# Patient Record
Sex: Female | Born: 1950 | Race: White | Hispanic: No | Marital: Married | Smoking: Former smoker
Health system: Southern US, Community
[De-identification: ages and names within clinical notes are randomized; demographics above are authoritative.]

## PROBLEM LIST (undated history)

## (undated) DIAGNOSIS — R112 Nausea with vomiting, unspecified: Secondary | ICD-10-CM

## (undated) DIAGNOSIS — I2510 Atherosclerotic heart disease of native coronary artery without angina pectoris: Secondary | ICD-10-CM

## (undated) DIAGNOSIS — M545 Low back pain, unspecified: Secondary | ICD-10-CM

## (undated) DIAGNOSIS — G4730 Sleep apnea, unspecified: Secondary | ICD-10-CM

## (undated) DIAGNOSIS — R7303 Prediabetes: Secondary | ICD-10-CM

## (undated) DIAGNOSIS — K5790 Diverticulosis of intestine, part unspecified, without perforation or abscess without bleeding: Secondary | ICD-10-CM

## (undated) DIAGNOSIS — E785 Hyperlipidemia, unspecified: Secondary | ICD-10-CM

## (undated) DIAGNOSIS — Q6102 Congenital multiple renal cysts: Secondary | ICD-10-CM

## (undated) DIAGNOSIS — J189 Pneumonia, unspecified organism: Secondary | ICD-10-CM

## (undated) DIAGNOSIS — I480 Paroxysmal atrial fibrillation: Secondary | ICD-10-CM

## (undated) DIAGNOSIS — M5134 Other intervertebral disc degeneration, thoracic region: Secondary | ICD-10-CM

## (undated) DIAGNOSIS — E119 Type 2 diabetes mellitus without complications: Secondary | ICD-10-CM

## (undated) DIAGNOSIS — I10 Essential (primary) hypertension: Secondary | ICD-10-CM

## (undated) DIAGNOSIS — Z7901 Long term (current) use of anticoagulants: Secondary | ICD-10-CM

## (undated) DIAGNOSIS — R51 Headache: Secondary | ICD-10-CM

## (undated) DIAGNOSIS — M1990 Unspecified osteoarthritis, unspecified site: Secondary | ICD-10-CM

## (undated) DIAGNOSIS — N9089 Other specified noninflammatory disorders of vulva and perineum: Secondary | ICD-10-CM

## (undated) DIAGNOSIS — I6523 Occlusion and stenosis of bilateral carotid arteries: Secondary | ICD-10-CM

## (undated) DIAGNOSIS — N183 Chronic kidney disease, stage 3 unspecified: Secondary | ICD-10-CM

## (undated) DIAGNOSIS — Z8719 Personal history of other diseases of the digestive system: Secondary | ICD-10-CM

## (undated) DIAGNOSIS — R42 Dizziness and giddiness: Secondary | ICD-10-CM

## (undated) DIAGNOSIS — R0600 Dyspnea, unspecified: Secondary | ICD-10-CM

## (undated) DIAGNOSIS — D509 Iron deficiency anemia, unspecified: Secondary | ICD-10-CM

## (undated) DIAGNOSIS — I350 Nonrheumatic aortic (valve) stenosis: Secondary | ICD-10-CM

## (undated) DIAGNOSIS — K219 Gastro-esophageal reflux disease without esophagitis: Secondary | ICD-10-CM

## (undated) DIAGNOSIS — F329 Major depressive disorder, single episode, unspecified: Secondary | ICD-10-CM

## (undated) DIAGNOSIS — R0609 Other forms of dyspnea: Secondary | ICD-10-CM

## (undated) DIAGNOSIS — G8929 Other chronic pain: Secondary | ICD-10-CM

## (undated) DIAGNOSIS — I4892 Unspecified atrial flutter: Secondary | ICD-10-CM

## (undated) DIAGNOSIS — G43909 Migraine, unspecified, not intractable, without status migrainosus: Secondary | ICD-10-CM

## (undated) DIAGNOSIS — J45998 Other asthma: Secondary | ICD-10-CM

## (undated) DIAGNOSIS — I209 Angina pectoris, unspecified: Secondary | ICD-10-CM

## (undated) DIAGNOSIS — I700 Atherosclerosis of aorta: Secondary | ICD-10-CM

## (undated) DIAGNOSIS — N2581 Secondary hyperparathyroidism of renal origin: Secondary | ICD-10-CM

## (undated) DIAGNOSIS — I4891 Unspecified atrial fibrillation: Secondary | ICD-10-CM

## (undated) DIAGNOSIS — Z9889 Other specified postprocedural states: Secondary | ICD-10-CM

## (undated) HISTORY — DX: Unspecified atrial flutter: I48.92

## (undated) HISTORY — PX: HERNIA REPAIR: SHX51

## (undated) HISTORY — DX: Occlusion and stenosis of bilateral carotid arteries: I65.23

## (undated) HISTORY — PX: APPENDECTOMY: SHX54

## (undated) HISTORY — PX: BREAST BIOPSY: SHX20

## (undated) HISTORY — DX: Prediabetes: R73.03

## (undated) HISTORY — PX: EXPLORATORY LAPAROTOMY: SUR591

## (undated) HISTORY — DX: Paroxysmal atrial fibrillation: I48.0

---

## 1979-08-23 DIAGNOSIS — Z8711 Personal history of peptic ulcer disease: Secondary | ICD-10-CM

## 1979-08-23 HISTORY — DX: Personal history of peptic ulcer disease: Z87.11

## 1979-12-23 HISTORY — PX: VAGINAL HYSTERECTOMY: SUR661

## 1979-12-23 HISTORY — PX: LUMBAR DISC SURGERY: SHX700

## 2004-03-18 ENCOUNTER — Encounter: Admission: RE | Admit: 2004-03-18 | Discharge: 2004-03-18 | Payer: Self-pay | Admitting: Internal Medicine

## 2004-05-03 ENCOUNTER — Encounter: Admission: RE | Admit: 2004-05-03 | Discharge: 2004-05-03 | Payer: Self-pay | Admitting: Gastroenterology

## 2004-07-01 ENCOUNTER — Ambulatory Visit (HOSPITAL_COMMUNITY): Admission: RE | Admit: 2004-07-01 | Discharge: 2004-07-01 | Payer: Self-pay | Admitting: Gastroenterology

## 2004-12-22 HISTORY — PX: CHOLECYSTECTOMY: SHX55

## 2005-12-15 ENCOUNTER — Emergency Department (HOSPITAL_COMMUNITY): Admission: EM | Admit: 2005-12-15 | Discharge: 2005-12-15 | Payer: Self-pay | Admitting: Emergency Medicine

## 2005-12-18 ENCOUNTER — Ambulatory Visit (HOSPITAL_COMMUNITY): Admission: RE | Admit: 2005-12-18 | Discharge: 2005-12-18 | Payer: Self-pay | Admitting: Preventative Medicine

## 2005-12-18 ENCOUNTER — Inpatient Hospital Stay (HOSPITAL_COMMUNITY): Admission: EM | Admit: 2005-12-18 | Discharge: 2005-12-23 | Payer: Self-pay | Admitting: Emergency Medicine

## 2005-12-19 ENCOUNTER — Encounter (INDEPENDENT_AMBULATORY_CARE_PROVIDER_SITE_OTHER): Payer: Self-pay | Admitting: General Surgery

## 2009-08-13 ENCOUNTER — Emergency Department (HOSPITAL_COMMUNITY): Admission: EM | Admit: 2009-08-13 | Discharge: 2009-08-13 | Payer: Self-pay | Admitting: Emergency Medicine

## 2011-03-29 LAB — BASIC METABOLIC PANEL
BUN: 17 mg/dL (ref 6–23)
CO2: 31 mEq/L (ref 19–32)
Calcium: 9.5 mg/dL (ref 8.4–10.5)
Chloride: 104 mEq/L (ref 96–112)
Creatinine, Ser: 0.76 mg/dL (ref 0.4–1.2)
GFR calc Af Amer: >60 mL/min (ref >60)
GFR calc non Af Amer: >60 mL/min (ref >60)
Glucose, Bld: 98 mg/dL (ref 70–99)
Potassium: 3.9 mEq/L (ref 3.5–5.1)
Sodium: 138 mEq/L (ref 135–145)

## 2011-03-29 LAB — DIFFERENTIAL
Basophils Absolute: 0 10*3/uL (ref 0.0–0.1)
Basophils Relative: 0 % (ref 0–1)
Eosinophils Absolute: 0.2 10*3/uL (ref 0.0–0.7)
Eosinophils Relative: 2 % (ref 0–5)
Lymphocytes Relative: 26 % (ref 12–46)
Lymphs Abs: 2.7 10*3/uL (ref 0.7–4.0)
Monocytes Absolute: 0.5 10*3/uL (ref 0.1–1.0)
Monocytes Relative: 5 % (ref 3–12)
Neutro Abs: 7 10*3/uL (ref 1.7–7.7)
Neutrophils Relative %: 67 % (ref 43–77)

## 2011-03-29 LAB — CBC
HCT: 38.3 % (ref 36.0–46.0)
Hemoglobin: 13.1 g/dL (ref 12.0–15.0)
MCHC: 34.1 g/dL (ref 30.0–36.0)
MCV: 90.7 fL (ref 78.0–100.0)
Platelets: 274 10*3/uL (ref 150–400)
RBC: 4.23 MIL/uL (ref 3.87–5.11)
RDW: 13.4 % (ref 11.5–15.5)
WBC: 10.5 10*3/uL (ref 4.0–10.5)

## 2011-05-09 NOTE — Op Note (Signed)
NAME:  Julie Rush, Julie Rush NO.:  [id number]   MEDICAL RECORD NO.:  [id number]                   PATIENT TYPE:  AMB   LOCATION:  ENDO                                 FACILITY:  [facility]   PHYSICIAN:  Anselmo Rod, M.D.               DATE OF BIRTH:  03-20-1951   DATE OF PROCEDURE:  07/01/2004  DATE OF DISCHARGE:                                 OPERATIVE REPORT   PROCEDURE PERFORMED:  Screening colonoscopy.   ENDOSCOPIST:  Charna Elizabeth, M.D.   INSTRUMENT USED:  Olympus video colonoscope.   INDICATIONS FOR PROCEDURE:  The patient is a 60 year old white female  undergoing screening colonoscopy.  Rule out colonic polyps, masses, etc.   PREPROCEDURE PREPARATION:  Informed consent was procured from the patient.  The patient was fasted for eight hours prior to the procedure and prepped  with a bottle of magnesium citrate and a gallon of GoLYTELY the night prior  to the procedure.   PREPROCEDURE PHYSICAL:  The patient had stable vital signs.  Neck supple.  Chest clear to auscultation.  S1 and S2 regular.  Abdomen soft with normal  bowel sounds.   DESCRIPTION OF PROCEDURE:  The patient was placed in left lateral decubitus  position and sedated with 100 mg of Demerol and 10 mg of Versed in slow  incremental doses.  Once the patient was adequately sedated and maintained  on low flow oxygen and continuous cardiac monitoring, the Olympus video  colonoscope was advanced from the rectum to the cecum.  The appendicular  orifice and ileocecal valve were clearly visualized and photographed.  There  was evidence of diffuse melanosis coli throughout the colon.  The patient's  position had to be changed from the left lateral to the supine and then the  right lateral position to reach the cecal base because of the patient's body  habitus and a history of multiple abdominal surgeries.  Retroflexion in the  rectum revealed no abnormalities.  The patient tolerated the procedure  well  without complication.   IMPRESSION:  Diffuse melanosis coli throughout the colon.  No masses,  polyps, or diverticula were seen.   RECOMMENDATIONS:  1. Continue high fiber diet with liberal fluid intake.  2. Avoid laxatives and use stool softeners instead.  3. Repeat colonoscopy is recommended in the next 10 years unless the patient     develops any abnormal symptoms in the interim.  4. Outpatient followup as need arises in the future.                                               Anselmo Rod, M.D.    JNM/MEDQ  D:  07/01/2004  T:  07/01/2004  Job:  [id number]   cc:   Merlene Laughter. Renae Gloss, M.D.  [phone]  [address]  [address]  [place]  [place] [address]  Fax: [phone]

## 2011-05-09 NOTE — Op Note (Signed)
NAME:  DIASHA, CASTLEMAN NO.:  [id number]   MEDICAL RECORD NO.:  [id number]          PATIENT TYPE:  INP   LOCATION:  A303                          FACILITY:  [facility]   PHYSICIAN:  Dirk Dress. Katrinka Blazing, M.D.   DATE OF BIRTH:  [birth year]   DATE OF PROCEDURE:  DATE OF DISCHARGE:  12/23/2005                                 OPERATIVE REPORT   PREOPERATIVE DIAGNOSIS:  Acute cholecystitis, cholelithiasis.   POSTOPERATIVE DIAGNOSIS:  Acute gangrenous cholecystitis with empyema of the  gallbladder and cholelithiasis.   PROCEDURE:  Open cholecystectomy.   SURGEON:  Dirk Dress. Katrinka Blazing, M.D.   DESCRIPTION OF PROCEDURE:  Under general anesthesia the patient's abdomen  was prepped and draped in a sterile field.  A right subcostal incision was  made.  The excision extended down to the fascia.  The rectus fascia was  opened and the muscle was transversely divided. The peritoneal was entered,  and an acute tense, darkened gallbladder was encountered.  The abdomen was  packed off.  The gallbladder was drained of 80 mL of pus.  This was thick,  white, purulent creamy, exudate.  There was no substance that could be  identified in the bile.  Once this was done, the gallbladder wall was scored  and the gallbladder was separated from the infrahepatic space with some  difficulty because of the extreme friability of the wall of the gallbladder.  The gallbladder could not be dissected without tearing, so once the incision  was made an open hand was placed in the peritoneal cavity and using finger  dissection, the gallbladder was totally separated from the intrahepatic bed.  Dissection was continued down to the cystic duct.  The cystic duct was  identified at its junction with the common bile duct.  At this point it was  tied with 3 sutures of 2-0 silk.  It was then divided.  There were two  vessels that were identified.  They were clipped with large clips and  divided.  The gallbladder was then  passed off the table.  Irrigation was  carried out.   Sponge, needle, and instrument and blade counts were verified as correct x2.  Two JP drains were placed in the right subhepatic space.  These were brought  out through separate stab incisions.  More irrigation was carried out.  The  abdomen was then closed after verifying that the sponge, needle, instrument  and blade counts were correct too.  The fascia was closed in two layers  using #0 Prolene.  The subcutaneous tissue was irrigated and closed with 2-0  Monocryl. The skin was closed with staples.  Drains were secured with 3-0  nylon.  The patient tolerated the procedure well.  Dressings were placed.  She was awakened without difficulty, she was transferred to a bed, and taken  to the postanesthetic care unit for further monitoring.      Dirk Dress. Katrinka Blazing, M.D.  Electronically Signed     LCS/MEDQ  D:  12/19/2005  T:  12/19/2005  Job:  [id number]   cc:   Merlene Laughter. Renae Gloss, M.D.  Fax:  [phone] 

## 2011-05-09 NOTE — Discharge Summary (Signed)
NAME:  Julie Rush, Julie Rush NO.:  [id number]   MEDICAL RECORD NO.:  [id number]          PATIENT TYPE:  INP   LOCATION:  A303                          FACILITY:  [facility]   PHYSICIAN:  Dirk Dress. Katrinka Blazing, M.D.   DATE OF BIRTH:  25-Feb-1951   DATE OF ADMISSION:  12/18/2005  DATE OF DISCHARGE:  01/02/2007LH                                 DISCHARGE SUMMARY   DISCHARGE DIAGNOSES:  1.  Acute gangrenous cholecystitis with empyema of the gallbladder.  2.  Cholelithiasis.  3.  Peptic ulcer disease.  4.  Gastroesophageal reflux disease.  5.  History of urinary stress incontinence.   PROCEDURE:  Open cholecystectomy on January 2.   DISPOSITION:  The patient discharged home in stable, satisfactory condition.   DISCHARGE MEDICATIONS:  1.  Cipro 500 mg b.i.d.  2.  Demerol 50 mg four times a day p.r.n.   SPECIAL INSTRUCTIONS:  The patient is advised to empty her JP drain once  daily.  She is to be seen in the office in 2 weeks.   HOSPITAL COURSE:  A 60 year old female with history of recurrent abdominal  pain, nausea and vomiting.  She had pain on December 24.  She was seen in  the emergency room and had EKG, cardiac enzymes and CT done which was normal  and she was discharged home.  She had progressive symptoms with recurrent  episodes of nausea and vomiting.  She states that she was symptomatic every  day from that point.  She was only able to keep water down.  Pain became  much more severe and she started having chills.  She returned to the local  [facility] and was scheduled to have an ultrasound.  Ultrasound  revealed multiple gallstones with a evidence of acute cholecystitis.  The  patient was admitted for treatment.  Abdominal exam revealed a diffusely  tender abdomen much more prominent in the upper abdomen.  She was not  jaundiced.  She was admitted and treated with IV antibiotics, IV fluids and  antiemetics.  She was scheduled to have an open cholecystectomy which  was  done on the day after  admission.  She did very well in the postoperative period.  She was  continued on IV antibiotics.  She had some constipation which was treated  with GoLYTELY and the bowels finally moved.  She was subsequently discharged  home on January 2, in satisfactory condition.      Dirk Dress. Katrinka Blazing, M.D.  Electronically Signed     LCS/MEDQ  D:  02/10/2006  T:  02/10/2006  Job:  [id number]

## 2011-05-09 NOTE — H&P (Signed)
NAME:  Julie Rush, Julie Rush NO.:  [id number]   MEDICAL RECORD NO.:  [id number]          PATIENT TYPE:  INP   LOCATION:  A303                          FACILITY:  [facility]   PHYSICIAN:  Dirk Dress. Katrinka Blazing, M.D.   DATE OF BIRTH:  04-12-1951   DATE OF ADMISSION:  12/18/2005  DATE OF DISCHARGE:  LH                                HISTORY & PHYSICAL   HISTORY OF PRESENT ILLNESS:  This is a 60 year old female with history of  recurrent abdominal pain, nausea and vomiting. The patient states that she  had abdominal pain on December 24. It was more of a sharp pain in her  epigastrium radiating into her chest and into her back. She had nausea with  vomiting. She was seen in the emergency room at this hospital. She had an  EKG, cardiac enzymes, and CT scan done and was discharged home. She had  progressive symptoms with recurrent episodes of nausea and vomiting. She has  been symptomatic every day since then. She has only been able to keep down  water. Yesterday she started having diarrhea. The pain became much worse and  she started having chills. The patient returned to the local urgent care  center and was scheduled to have an ultrasound. Ultrasound of the abdomen  revealed multiple gallstones with evidence of acute cholecystitis. The  patient is admitted for treatment.   PAST MEDICAL HISTORY:  1.  She has peptic ulcer disease.  2.  Gastroesophageal reflux disease.  3.  Urinary stress incontinence.   MEDICATIONS:  Prilosec over-the-counter once a day.   SURGICAL HISTORY:  Right breast biopsy, ventral hernia repair, appendectomy,  lumbar disk surgery, exploratory laparotomy for bowel obstruction.   ALLERGIES:  CODEINE AND SULFA.   SOCIAL HISTORY:  She is married with two children. She is employed as a  Architect. She does not use tobacco, drugs or alcohol.   PHYSICAL EXAMINATION:  GENERAL:  The patient appears to be in moderate  distress.  VITAL SIGNS:  Blood pressure is  108/78, pulse 115 respirations 20,  temperature 98.5.  HEENT:  Unremarkable.  NECK:  Supple, no JVD, bruit, adenopathy or thyromegaly.  CHEST:  Clear to auscultation, no rales, rubs, rhonchi or wheezes.  HEART:  Regular rate and rhythm without murmur, rub, or gallop.  ABDOMEN:  Obese, diffusely tender in the upper abdomen. She has a well-  healed upper midline scar, upper transverse scar and a hypogastric  transverse scar. She has normal active bowel sounds.  EXTREMITIES:  Unremarkable except for diffuse tenderness of the skin. There  is no significant edema.  NEUROLOGIC EXAM:  No focal motor, sensory or cerebellar deficit.   IMPRESSION:  1.  Acute cholecystitis with cholelithiasis.  2.  Gastroesophageal reflux disease.  3.  History of peptic ulcer disease.  4.  Stress incontinence.   PLAN:  The patient is admitted. She will be treated with antibiotic, IV  fluids, antiemetics. She will be scheduled for open cholecystectomy  tomorrow. The procedure will be done open because of her history of  intestinal obstruction with need for adhesiolysis in  the upper abdomen and  the mid area from the umbilicus up to the xiphoid area.      Dirk Dress. Katrinka Blazing, M.D.  Electronically Signed     LCS/MEDQ  D:  12/18/2005  T:  12/18/2005  Job:  [id number]

## 2011-09-19 ENCOUNTER — Encounter: Payer: Self-pay | Admitting: *Deleted

## 2011-09-19 NOTE — Progress Notes (Signed)
ERROR

## 2011-10-13 ENCOUNTER — Encounter: Payer: Self-pay | Admitting: *Deleted

## 2011-12-23 DIAGNOSIS — E119 Type 2 diabetes mellitus without complications: Secondary | ICD-10-CM

## 2011-12-23 DIAGNOSIS — G4730 Sleep apnea, unspecified: Secondary | ICD-10-CM

## 2011-12-23 HISTORY — DX: Sleep apnea, unspecified: G47.30

## 2011-12-23 HISTORY — DX: Type 2 diabetes mellitus without complications: E11.9

## 2012-03-22 LAB — HM PAP SMEAR: HM Pap smear: NORMAL

## 2012-04-05 LAB — HM COLONOSCOPY

## 2012-04-16 ENCOUNTER — Other Ambulatory Visit: Payer: Self-pay | Admitting: Family Medicine

## 2012-04-16 DIAGNOSIS — Z1231 Encounter for screening mammogram for malignant neoplasm of breast: Secondary | ICD-10-CM

## 2012-04-26 ENCOUNTER — Ambulatory Visit
Admission: RE | Admit: 2012-04-26 | Discharge: 2012-04-26 | Disposition: A | Discharge status: Home / Self Care | Payer: PRIVATE HEALTH INSURANCE | Source: Ambulatory Visit | Attending: Family Medicine | Admitting: Family Medicine

## 2012-04-26 DIAGNOSIS — Z1231 Encounter for screening mammogram for malignant neoplasm of breast: Secondary | ICD-10-CM

## 2012-06-08 ENCOUNTER — Encounter (HOSPITAL_COMMUNITY): Payer: Self-pay | Admitting: Cardiology

## 2012-06-08 ENCOUNTER — Observation Stay (HOSPITAL_COMMUNITY)
Admission: EM | Admit: 2012-06-08 | Discharge: 2012-06-09 | Disposition: A | Discharge status: Home / Self Care | Payer: PRIVATE HEALTH INSURANCE | Source: Ambulatory Visit | Attending: Cardiology | Admitting: Cardiology

## 2012-06-08 ENCOUNTER — Emergency Department (HOSPITAL_COMMUNITY): Payer: PRIVATE HEALTH INSURANCE

## 2012-06-08 DIAGNOSIS — R0609 Other forms of dyspnea: Secondary | ICD-10-CM | POA: Diagnosis present

## 2012-06-08 DIAGNOSIS — M353 Polymyalgia rheumatica: Secondary | ICD-10-CM | POA: Insufficient documentation

## 2012-06-08 DIAGNOSIS — K219 Gastro-esophageal reflux disease without esophagitis: Secondary | ICD-10-CM | POA: Insufficient documentation

## 2012-06-08 DIAGNOSIS — R0789 Other chest pain: Principal | ICD-10-CM | POA: Insufficient documentation

## 2012-06-08 DIAGNOSIS — F32A Depression, unspecified: Secondary | ICD-10-CM

## 2012-06-08 DIAGNOSIS — I10 Essential (primary) hypertension: Secondary | ICD-10-CM | POA: Insufficient documentation

## 2012-06-08 DIAGNOSIS — F3289 Other specified depressive episodes: Secondary | ICD-10-CM | POA: Insufficient documentation

## 2012-06-08 DIAGNOSIS — R079 Chest pain, unspecified: Secondary | ICD-10-CM

## 2012-06-08 DIAGNOSIS — E785 Hyperlipidemia, unspecified: Secondary | ICD-10-CM | POA: Diagnosis present

## 2012-06-08 DIAGNOSIS — E1121 Type 2 diabetes mellitus with diabetic nephropathy: Secondary | ICD-10-CM | POA: Diagnosis present

## 2012-06-08 DIAGNOSIS — R0602 Shortness of breath: Secondary | ICD-10-CM | POA: Insufficient documentation

## 2012-06-08 DIAGNOSIS — F329 Major depressive disorder, single episode, unspecified: Secondary | ICD-10-CM | POA: Insufficient documentation

## 2012-06-08 DIAGNOSIS — R110 Nausea: Secondary | ICD-10-CM | POA: Insufficient documentation

## 2012-06-08 DIAGNOSIS — E119 Type 2 diabetes mellitus without complications: Secondary | ICD-10-CM | POA: Insufficient documentation

## 2012-06-08 HISTORY — DX: Gastro-esophageal reflux disease without esophagitis: K21.9

## 2012-06-08 HISTORY — DX: Other forms of dyspnea: R06.09

## 2012-06-08 HISTORY — DX: Angina pectoris, unspecified: I20.9

## 2012-06-08 HISTORY — DX: Major depressive disorder, single episode, unspecified: F32.9

## 2012-06-08 HISTORY — DX: Low back pain: M54.5

## 2012-06-08 HISTORY — DX: Sleep apnea, unspecified: G47.30

## 2012-06-08 HISTORY — DX: Other chronic pain: G89.29

## 2012-06-08 HISTORY — DX: Type 2 diabetes mellitus without complications: E11.9

## 2012-06-08 HISTORY — DX: Other specified postprocedural states: Z98.890

## 2012-06-08 HISTORY — DX: Migraine, unspecified, not intractable, without status migrainosus: G43.909

## 2012-06-08 HISTORY — DX: Essential (primary) hypertension: I10

## 2012-06-08 HISTORY — DX: Nausea with vomiting, unspecified: R11.2

## 2012-06-08 HISTORY — DX: Unspecified osteoarthritis, unspecified site: M19.90

## 2012-06-08 HISTORY — DX: Personal history of other diseases of the digestive system: Z87.19

## 2012-06-08 HISTORY — DX: Dyspnea, unspecified: R06.00

## 2012-06-08 HISTORY — DX: Depression, unspecified: F32.A

## 2012-06-08 HISTORY — DX: Pneumonia, unspecified organism: J18.9

## 2012-06-08 HISTORY — DX: Low back pain, unspecified: M54.50

## 2012-06-08 HISTORY — DX: Headache: R51

## 2012-06-08 LAB — POCT I-STAT, CHEM 8
BUN: 13 mg/dL (ref 6–23)
Calcium, Ion: 1.23 mmol/L (ref 1.12–1.32)
Chloride: 103 mEq/L (ref 96–112)
Creatinine, Ser: 0.9 mg/dL (ref 0.50–1.10)
Glucose, Bld: 133 mg/dL — ABNORMAL HIGH (ref 70–99)
HCT: 40 % (ref 36.0–46.0)
Hemoglobin: 13.6 g/dL (ref 12.0–15.0)
Potassium: 4.7 mEq/L (ref 3.5–5.1)
Sodium: 140 mEq/L (ref 135–145)
TCO2: 25 mmol/L (ref 0–100)

## 2012-06-08 LAB — CBC
HCT: 38.4 % (ref 36.0–46.0)
HCT: 38 % (ref 36.0–46.0)
Hemoglobin: 12.6 g/dL (ref 12.0–15.0)
Hemoglobin: 12.5 g/dL (ref 12.0–15.0)
MCH: 29.9 pg (ref 26.0–34.0)
MCH: 30 pg (ref 26.0–34.0)
MCHC: 32.8 g/dL (ref 30.0–36.0)
MCHC: 32.9 g/dL (ref 30.0–36.0)
MCV: 91.2 fL (ref 78.0–100.0)
MCV: 91.1 fL (ref 78.0–100.0)
Platelets: 256 10*3/uL (ref 150–400)
Platelets: 270 10*3/uL (ref 150–400)
RBC: 4.21 MIL/uL (ref 3.87–5.11)
RBC: 4.17 MIL/uL (ref 3.87–5.11)
RDW: 13.2 % (ref 11.5–15.5)
RDW: 13.2 % (ref 11.5–15.5)
WBC: 10.5 10*3/uL (ref 4.0–10.5)
WBC: 10.5 10*3/uL (ref 4.0–10.5)

## 2012-06-08 LAB — CARDIAC PANEL(CRET KIN+CKTOT+MB+TROPI)
CK, MB: 1.5 ng/mL (ref 0.3–4.0)
Relative Index: INVALID (ref 0.0–2.5)
Total CK: 59 U/L (ref 7–177)
Troponin I: <0.3 ng/mL (ref <0.30)

## 2012-06-08 LAB — CREATININE, SERUM
Creatinine, Ser: 0.82 mg/dL (ref 0.50–1.10)
GFR calc Af Amer: 87 mL/min — ABNORMAL LOW (ref >90)
GFR calc non Af Amer: 75 mL/min — ABNORMAL LOW (ref >90)

## 2012-06-08 LAB — GLUCOSE, CAPILLARY: Glucose-Capillary: 145 mg/dL — ABNORMAL HIGH (ref 70–99)

## 2012-06-08 LAB — POCT I-STAT TROPONIN I: Troponin i, poc: 0 ng/mL (ref 0.00–0.08)

## 2012-06-08 LAB — DIFFERENTIAL
Basophils Absolute: 0 10*3/uL (ref 0.0–0.1)
Basophils Relative: 0 % (ref 0–1)
Eosinophils Absolute: 0.2 10*3/uL (ref 0.0–0.7)
Eosinophils Relative: 2 % (ref 0–5)
Lymphocytes Relative: 24 % (ref 12–46)
Lymphs Abs: 2.5 10*3/uL (ref 0.7–4.0)
Monocytes Absolute: 0.6 10*3/uL (ref 0.1–1.0)
Monocytes Relative: 6 % (ref 3–12)
Neutro Abs: 7.1 10*3/uL (ref 1.7–7.7)
Neutrophils Relative %: 68 % (ref 43–77)

## 2012-06-08 MED ORDER — OMEPRAZOLE MAGNESIUM 20 MG PO TBEC: 20.0000 mg | DELAYED_RELEASE_TABLET | Freq: Two times a day (BID) | ORAL | Status: DC

## 2012-06-08 MED ORDER — ALPRAZOLAM 0.25 MG PO TABS: 0.2500 mg | ORAL_TABLET | Freq: Two times a day (BID) | ORAL | Status: DC | PRN

## 2012-06-08 MED ORDER — ONDANSETRON HCL 4 MG/2ML IJ SOLN: 4.0000 mg | Freq: Four times a day (QID) | INTRAMUSCULAR | Status: DC | PRN

## 2012-06-08 MED ORDER — PANTOPRAZOLE SODIUM 40 MG PO TBEC: 40.0000 mg | DELAYED_RELEASE_TABLET | Freq: Every day | ORAL | Status: DC

## 2012-06-08 MED ORDER — SODIUM CHLORIDE 0.9 % IJ SOLN: 3.0000 mL | INTRAMUSCULAR | Status: DC | PRN

## 2012-06-08 MED ORDER — SODIUM CHLORIDE 0.9 % IV SOLN: 250.0000 mL | INTRAVENOUS | Status: DC | PRN

## 2012-06-08 MED ORDER — ENOXAPARIN SODIUM 40 MG/0.4ML ~~LOC~~ SOLN
40.0000 mg | SUBCUTANEOUS | Status: DC
  Administered 2012-06-09: 40 mg via SUBCUTANEOUS
  Filled 2012-06-08 (×2): qty 0.4

## 2012-06-08 MED ORDER — ASPIRIN 300 MG RE SUPP
300.0000 mg | RECTAL | Status: DC
  Filled 2012-06-08: qty 1

## 2012-06-08 MED ORDER — SODIUM CHLORIDE 0.9 % IJ SOLN
3.0000 mL | Freq: Two times a day (BID) | INTRAMUSCULAR | Status: DC
  Administered 2012-06-09 (×2): 3 mL via INTRAVENOUS

## 2012-06-08 MED ORDER — ACETAMINOPHEN 500 MG PO TABS
1000.0000 mg | ORAL_TABLET | Freq: Four times a day (QID) | ORAL | Status: DC | PRN
  Administered 2012-06-09: 975 mg via ORAL
  Filled 2012-06-08: qty 2

## 2012-06-08 MED ORDER — ASPIRIN EC 81 MG PO TBEC
81.0000 mg | DELAYED_RELEASE_TABLET | Freq: Every day | ORAL | Status: DC
  Administered 2012-06-09: 81 mg via ORAL
  Filled 2012-06-08: qty 1

## 2012-06-08 MED ORDER — ASPIRIN 81 MG PO CHEW: 324.0000 mg | CHEWABLE_TABLET | ORAL | Status: DC

## 2012-06-08 MED ORDER — NITROGLYCERIN 0.4 MG SL SUBL: 0.4000 mg | SUBLINGUAL_TABLET | SUBLINGUAL | Status: DC | PRN

## 2012-06-08 MED ORDER — METOPROLOL SUCCINATE ER 50 MG PO TB24
50.0000 mg | ORAL_TABLET | Freq: Every morning | ORAL | Status: DC
  Administered 2012-06-09: 50 mg via ORAL
  Filled 2012-06-08: qty 1

## 2012-06-08 NOTE — H&P (Addendum)
Julie Rush is an 61 y.o. female.    Chief Complaint: Chest pain. HPI: Chest pain started today and was seen by Dr. Kellie Shropshire.Chest pain that started with nausea yesterday she states that yesterday she woke up with chest  Pain. Since then she has been noticing severe chest pain.  This is associated with diaphoresis. Patient also states that she's been pretty much worn out over the last several weeks. She also complains of pain in her abdomen, headaches, leg ache.  On further questioning she denies any fever, hemoptysis, PND, orthopnea, palpitation.  She was previously evaluated for similar chest pain in the past.  She denies any bloody stools, dark stools.  No fever.  No bladder disturbances. It is described as cramp. It is located in the front of the chest region.   She was sent over to the emergency department from Dr. Currie Paris office for further evaluation.  This morning patient is very comfortable although she she states that she is very tired and worn out and has no energy.  She feels like she wants to sleep all day.  She states that she is hurting all over the body and the chest.    Patient c/o shortness of breath. Shortness of breath gradual and has been ongoing for the last  several years and has not changed.No PND  or orthopnea. Minimal exertion brings on symptoms. Worse with climbing stairs or uphill walking and relieved with rest. This has been ongoing for the past several months to several years and has not changed.  No hemoptysis.  Past Medical History  Diagnosis Date  . Hypertension   . GERD (gastroesophageal reflux disease)   . Diabetes mellitus     History reviewed. No pertinent past surgical history.  History reviewed. No pertinent family history. Social History:  does not have a smoking history on file. She does not have any smokeless tobacco history on file. Her alcohol and drug histories not on file.  Allergies:  Allergies  Allergen Reactions  . Codeine Nausea  And Vomiting  . Sulfa Antibiotics Itching and Rash  . Trimethoprim Hives    Medications Prior to Admission  Medication Sig Dispense Refill  . acetaminophen (TYLENOL) 500 MG tablet Take 1,000 mg by mouth every 6 (six) hours as needed. For pain      . metoprolol succinate (TOPROL-XL) 50 MG 24 hr tablet Take 50 mg by mouth every morning. Take with or immediately following a meal.      . nitroGLYCERIN (NITROSTAT) 0.4 MG SL tablet Place 0.4 mg under the tongue every 5 (five) minutes as needed. For chest pain      . omeprazole (PRILOSEC OTC) 20 MG tablet Take 20 mg by mouth daily.        Results for orders placed during the hospital encounter of 06/08/12 (from the past 48 hour(s))  CBC     Status: Normal   Collection Time   06/08/12  6:51 PM      Component Value Range Comment   WBC 10.5  4.0 - 10.5 K/uL    RBC 4.21  3.87 - 5.11 MIL/uL    Hemoglobin 12.6  12.0 - 15.0 g/dL    HCT 16.1  09.6 - 04.5 %    MCV 91.2  78.0 - 100.0 fL    MCH 29.9  26.0 - 34.0 pg    MCHC 32.8  30.0 - 36.0 g/dL    RDW 40.9  81.1 - 91.4 %    Platelets  256  150 - 400 K/uL   DIFFERENTIAL     Status: Normal   Collection Time   06/08/12  6:51 PM      Component Value Range Comment   Neutrophils Relative 68  43 - 77 %    Neutro Abs 7.1  1.7 - 7.7 K/uL    Lymphocytes Relative 24  12 - 46 %    Lymphs Abs 2.5  0.7 - 4.0 K/uL    Monocytes Relative 6  3 - 12 %    Monocytes Absolute 0.6  0.1 - 1.0 K/uL    Eosinophils Relative 2  0 - 5 %    Eosinophils Absolute 0.2  0.0 - 0.7 K/uL    Basophils Relative 0  0 - 1 %    Basophils Absolute 0.0  0.0 - 0.1 K/uL   POCT I-STAT TROPONIN I     Status: Normal   Collection Time   06/08/12  7:05 PM      Component Value Range Comment   Troponin i, poc 0.00  0.00 - 0.08 ng/mL    Comment 3            POCT I-STAT, CHEM 8     Status: Abnormal   Collection Time   06/08/12  7:06 PM      Component Value Range Comment   Sodium 140  135 - 145 mEq/L    Potassium 4.7  3.5 - 5.1 mEq/L     Chloride 103  96 - 112 mEq/L    BUN 13  6 - 23 mg/dL    Creatinine, Ser 1.61  0.50 - 1.10 mg/dL    Glucose, Bld 096 (*) 70 - 99 mg/dL    Calcium, Ion 0.45  4.09 - 1.32 mmol/L    TCO2 25  0 - 100 mmol/L    Hemoglobin 13.6  12.0 - 15.0 g/dL    HCT 81.1  91.4 - 78.2 %    Dg Chest Portable 1 View  06/08/2012  *RADIOLOGY REPORT*  Clinical Data: Chest pain  PORTABLE CHEST - 1 VIEW  Comparison: 12/15/2005  Findings: Borderline cardiomegaly noted.  No acute infiltrate or pleural effusion.  No pulmonary edema.  Bony thorax is stable .  IMPRESSION: No active disease.  No significant change.  Original Report Authenticated By: Natasha Mead, M.D.   Lipid Panel     Component Value Date/Time   CHOL 173 06/09/2012 0350   TRIG 118 06/09/2012 0350   HDL 42 06/09/2012 0350   CHOLHDL 4.1 06/09/2012 0350   VLDL 24 06/09/2012 0350   LDLCALC 107* 06/09/2012 0350    Cardiac Panel (last 3 results)  Basename 06/09/12 0834 06/09/12 0350 06/08/12 2215  CKTOTAL 126 54 59  CKMB 2.0 1.6 1.5  TROPONINI <0.30 <0.30 <0.30  RELINDX 1.6 RELATIVE INDEX IS INVALID RELATIVE INDEX IS INVALID       ROS: Arthritis-yes, life-style limiting-yes;  Cramping of the legs and feet at night or with activity-yes; Diabetes-no diet controlled;  Hypothyroidism-no;  Previous Stroke-no, Previous GI Bleed-has stomach ulcers ,  Recent weight change-no.     Other systems negative.    Blood pressure 124/73, pulse 74, temperature 98.1 F (36.7 C), temperature source Oral, resp. rate 20, height 5\' 6"  (1.676 m), weight 134.4 kg (296 lb 4.8 oz), SpO2 98.00%.  Well build and morbidly obese body habitus who is  in no acute distress.   Appears stated age.   Alert Ox3.   There is no cyanosis.  NECK: no JVD noted.   Short neck. CAROTIDS: No carotid bruits are noted.     CARDIAC EXAM: S1 S2 normal, no gallop or murmur.   Distant heart sounds.     CHEST EXAM: No tenderness of chest wall.   LUNGS: Clear to percuss and auscultate.   No rales or  ronchi.     ABDOMEN: Soft non tender, obese with pannus.   No organomegaly.   BS positive in all 4 quadrants.     EXTREMITY: No edema.   Normal pulses.   Fatty legs.     MUSCULOSKELETAL EXAM: Intact with full range of motion in all 4 extremities.   NEUROLOGIC EXAM: Grossly intact without any focal deficits.   Alert O x 3.     VASCULAR EXAM: No skin breakdown.   Carotids  normal.   Extremities: Femoral pulse normal.   Popliteal pulse difficult to feel ; Pedal pulse normal.   Otherwise.  EKG: NSR, Normal intervals. No ischemia.   Assessment/Plan 1. Chest pain very atypical. She has soft risk factors including diet controlled diabetes, hypertension and borderline hyperlipidemia. Out patient testing: Echo 08/07/11: Normal LVEF.   Trace MR and TR.   Mild left atrial enlargement.   Lexiscan Stress 08/08/11: Gut uptake artifact.   Low risk.     Rec:I had a long discussion with the patient and gone over the results of the normal EKG on admission, negative cardiac markers and her presentation being very atypical for unstable angina.  On further questioning patient states that she is depressed. I had a lengthy discussion with the patient and her daughter at the bedside.  I discussed with them that although coronary artery disease cannot be completely excluded it appears to be low on the risk profile.  Even if she did have coronary artery disease her presentation does not appear to be consistent with unstable angina. I discussed with her regarding proceeding with cardiac catheterization versus watchful waiting.  Also discussed regarding initiating antidepressant therapy. I called Dr. Kellie Shropshire this morning andShe will be prescribing her Cymbalta 30 mg 1 by mouth daily and follow her up in 2 weeks.  I will discharge the patient this morning and I will see her back in 3-4 weeks or sooner if she has recurrence of chest pain.  I have also advised her not to go back to work for at least a week.  Her daughter is  present at bedside. Pamella Pert, MD 06/08/2012, 9:45 PM

## 2012-06-08 NOTE — ED Notes (Signed)
EKG done

## 2012-06-08 NOTE — ED Notes (Signed)
Pt to department via EMS from  [facility]- pt reports that she was seen there for leg weakness and started having pain. MD wanted pt transported for evaluation. Pt received 2 nitro and 324 asa. 20 LAC. Bp- 138/70 HR-84

## 2012-06-08 NOTE — ED Notes (Signed)
Resting quietly on stretcher with family at bedside; no complaints at this time; ccm remains SR without ectopy

## 2012-06-08 NOTE — ED Notes (Signed)
Described interm "heaviness" to substernal chest area - onset at approx 1545-1600 this afternoon while sitting in PCP's office (there for recheck - had tests ran last week secondary to fatigue and muscle spasms); also endorses dizziness, nausea, sob, and feeling "sleepy"; heaviness dissipated after resting; states then had "sharp spasm" to mid chest while enroute to ED by EMS; was given NTG x2 and ASA 324 mg - states spasms and heaviness alleviated after NTG and ASA administration; presently reports feeling "nervous, weak and anxious"

## 2012-06-08 NOTE — ED Provider Notes (Signed)
History     CSN: [id number]  Arrival date & time 06/08/12  1732   First MD Initiated Contact with Patient 06/08/12 1817      Chief Complaint  Patient presents with  . Chest Pain    (Consider location/radiation/quality/duration/timing/severity/associated sxs/prior treatment) HPI Patient sent from Dr. Mathews Robinsons office. Patient developed chest pain anterior described as a pressure nonradiating onset at 4 PM today moderate to severe in  severity accompanying symptoms include nausea and shortness of breath. She was brought by EMS treated with baby aspirin and 2 sublingual nitroglycerin with complete relief of chest discomfort she presently complains of generalized weakness no other complaint. Nothing makes symptoms better or worse however symptoms were alleviated with nitroglycerin treatment Past Medical History  Diagnosis Date  . Hypertension   . GERD (gastroesophageal reflux disease)   . Diabetes mellitus     History reviewed. No pertinent past surgical history.  History reviewed. No pertinent family history.  History  Substance Use Topics  . Smoking status: Not on file  . Smokeless tobacco: Not on file  . Alcohol Use:     OB History    Grav Para Term Preterm Abortions TAB SAB Ect Mult Living                  Review of Systems  HENT: Negative.   Respiratory: Positive for shortness of breath.   Cardiovascular: Positive for chest pain.  Gastrointestinal: Positive for nausea.  Musculoskeletal: Negative.   Skin: Negative.   Neurological: Positive for weakness.  Hematological: Negative.   Psychiatric/Behavioral: Negative.     Allergies  Codeine; Trimethoprim; and Sulfa antibiotics  Home Medications   Current Outpatient Rx  Name Route Sig Dispense Refill  . ACETAMINOPHEN 500 MG PO TABS Oral Take 1,000 mg by mouth every 6 (six) hours as needed. For pain    . METOPROLOL SUCCINATE ER 50 MG PO TB24 Oral Take 50 mg by mouth every morning. Take with or immediately  following a meal.    . NITROGLYCERIN 0.4 MG SL SUBL Sublingual Place 0.4 mg under the tongue every 5 (five) minutes as needed. For chest pain    . OMEPRAZOLE MAGNESIUM 20 MG PO TBEC Oral Take 20 mg by mouth daily.      BP 154/90  Pulse 91  Temp 97.8 F (36.6 C) (Oral)  Resp 24  SpO2 99%  Physical Exam  Nursing note and vitals reviewed. Constitutional: She appears well-developed and well-nourished.  HENT:  Head: Normocephalic and atraumatic.  Eyes: Conjunctivae are normal. Pupils are equal, round, and reactive to light.  Neck: Neck supple. No tracheal deviation present. No thyromegaly present.  Cardiovascular: Normal rate and regular rhythm.   No murmur heard. Pulmonary/Chest: Effort normal and breath sounds normal.  Abdominal: Soft. Bowel sounds are normal. She exhibits no distension. There is no tenderness.       Obese  Musculoskeletal: Normal range of motion. She exhibits no edema and no tenderness.  Neurological: She is alert. Coordination normal.  Skin: Skin is warm and dry. No rash noted.  Psychiatric: She has a normal mood and affect.    Date: 06/08/2012  Rate: 75  Rhythm: normal sinus rhythm  QRS Axis: left  Intervals: normal  ST/T Wave abnormalities: nonspecific T wave changes  Conduction Disutrbances:nonspecific intraventricular conduction delay  Narrative Interpretation:   Old EKG Reviewed: none available  ED Course  Procedures (including critical care time)   Labs Reviewed  CBC  DIFFERENTIAL   No results found.  No diagnosis found.  Results for orders placed during the hospital encounter of 06/08/12  CBC      Component Value Range   WBC 10.5  4.0 - 10.5 K/uL   RBC 4.21  3.87 - 5.11 MIL/uL   Hemoglobin 12.6  12.0 - 15.0 g/dL   HCT 16.1  09.6 - 04.5 %   MCV 91.2  78.0 - 100.0 fL   MCH 29.9  26.0 - 34.0 pg   MCHC 32.8  30.0 - 36.0 g/dL   RDW 40.9  81.1 - 91.4 %   Platelets 256  150 - 400 K/uL  DIFFERENTIAL      Component Value Range    Neutrophils Relative 68  43 - 77 %   Neutro Abs 7.1  1.7 - 7.7 K/uL   Lymphocytes Relative 24  12 - 46 %   Lymphs Abs 2.5  0.7 - 4.0 K/uL   Monocytes Relative 6  3 - 12 %   Monocytes Absolute 0.6  0.1 - 1.0 K/uL   Eosinophils Relative 2  0 - 5 %   Eosinophils Absolute 0.2  0.0 - 0.7 K/uL   Basophils Relative 0  0 - 1 %   Basophils Absolute 0.0  0.0 - 0.1 K/uL  POCT I-STAT, CHEM 8      Component Value Range   Sodium 140  135 - 145 mEq/L   Potassium 4.7  3.5 - 5.1 mEq/L   Chloride 103  96 - 112 mEq/L   BUN 13  6 - 23 mg/dL   Creatinine, Ser 7.82  0.50 - 1.10 mg/dL   Glucose, Bld 956 (*) 70 - 99 mg/dL   Calcium, Ion 2.13  0.86 - 1.32 mmol/L   TCO2 25  0 - 100 mmol/L   Hemoglobin 13.6  12.0 - 15.0 g/dL   HCT 57.8  46.9 - 62.9 %  POCT I-STAT TROPONIN I      Component Value Range   Troponin i, poc 0.00  0.00 - 0.08 ng/mL   Comment 3            Dg Chest Portable 1 View  06/08/2012  *RADIOLOGY REPORT*  Clinical Data: Chest pain  PORTABLE CHEST - 1 VIEW  Comparison: 12/15/2005  Findings: Borderline cardiomegaly noted.  No acute infiltrate or pleural effusion.  No pulmonary edema.  Bony thorax is stable .  IMPRESSION: No active disease.  No significant change.  Original Report Authenticated By: Natasha Mead, M.D.     MDM  Concern for unstable angina given cardiac risk factors,'s history and relief with sublingual nitroglycerin Spoke with Dr Jacinto Halim.  Plan admit telemetry diagnosis chest pain        Doug Sou, MD 06/08/12 2036

## 2012-06-08 NOTE — ED Notes (Signed)
Ambulatory to restroom without event  

## 2012-06-09 LAB — HEMOGLOBIN A1C
Hgb A1c MFr Bld: 6.5 % — ABNORMAL HIGH (ref <5.7)
Mean Plasma Glucose: 140 mg/dL — ABNORMAL HIGH (ref <117)

## 2012-06-09 LAB — LIPID PANEL
Cholesterol: 173 mg/dL (ref 0–200)
HDL: 42 mg/dL (ref >39)
LDL Cholesterol: 107 mg/dL — ABNORMAL HIGH (ref 0–99)
Total CHOL/HDL Ratio: 4.1 RATIO
Triglycerides: 118 mg/dL (ref <150)
VLDL: 24 mg/dL (ref 0–40)

## 2012-06-09 LAB — CARDIAC PANEL(CRET KIN+CKTOT+MB+TROPI)
CK, MB: 1.6 ng/mL (ref 0.3–4.0)
CK, MB: 2 ng/mL (ref 0.3–4.0)
Relative Index: INVALID (ref 0.0–2.5)
Relative Index: 1.6 (ref 0.0–2.5)
Total CK: 54 U/L (ref 7–177)
Total CK: 126 U/L (ref 7–177)
Troponin I: <0.3 ng/mL (ref <0.30)
Troponin I: <0.3 ng/mL (ref <0.30)

## 2012-06-09 MED ORDER — DULOXETINE HCL 30 MG PO CPEP: 30.0000 mg | ORAL_CAPSULE | Freq: Every day | ORAL | Status: DC

## 2012-06-09 NOTE — Clinical Social Work Psychosocial (Signed)
     Clinical Social Work Department BRIEF PSYCHOSOCIAL ASSESSMENT 06/09/2012  Patient:  Julie Rush, Julie Rush     Account Number:  [id number]     Admit date:  06/08/2012  Clinical Social Worker:  Doree Albee  Date/Time:  06/09/2012 10:30 AM  Referred by:  RN  Date Referred:  06/09/2012 Referred for  Advanced Directives   Other Referral:   Interview type:  Patient Other interview type:    PSYCHOSOCIAL DATA Living Status:  FAMILY Admitted from facility:   Level of care:   Primary support name:  Ashantia Amaral Primary support relationship to patient:  SPOUSE Degree of support available:   strong    CURRENT CONCERNS Current Concerns  Other - See comment   Other Concerns:   Advanced Directives    SOCIAL WORK ASSESSMENT / PLAN CSW met with pt at bedside to discuss advanced directives packet. CSW and Pt discussed roles of living will and health care power of attorney. Pt shared she currently had a living will, however wanted to have a health care power of attorney.    CSW and pt discussed the health care power of attorney and directions on completing health care power of attorney. Pt was in the process of discharging home and agreed to follow up with community notary. Pt verbalized understanding that pt should not sign or date the advanced directive unless a notary was present. Pt also verablized understanding of needing two witnesses to witness pt signature that are not related or would benefit from advanced directive.    Pt shared she lives at home iwth her husband and pt daughter was present to provide transportation for pt to return home today.   Assessment/plan status:  No Further Intervention Required Other assessment/ plan:   Information/referral to community resources:   Advanced Directives    PATIENTS/FAMILYS RESPONSE TO PLAN OF CARE: Pt appreciated csw concern and support regarding advanced directive. Pt is motivated to complete health care power of  attorney. Pt is motivated to discharge home with spouse and family.

## 2012-06-09 NOTE — Discharge Summary (Addendum)
Please see admission note for the same date.  Patient was admitted for 24 hours observation.  She was ruled out for myocardial infarction.  She is being discharged home today with outpatient followup with Dr. Kellie Shropshire in 2 weeks.  Patient will be started on Cymbalta 30 mg by mouth daily on discharge and this is being called in by Dr. Renae Gloss.   Physician Discharge Summary  Patient ID: Julie Rush MRN: [id number] DOB/AGE: 1951/07/14 61 y.o.  Admit date: 06/08/2012 Discharge date: 06/09/2012  Primary Discharge Diagnosis Chest pain non cardiac.   Secondary Discharge Diagnosis Depression Polymyalgia Hypertension. Morbid obesity.  Significant Diagnostic Studies:  Consults:   Hospital Course:    Discharge Exam: Blood pressure 124/73, pulse 74, temperature 98.1 F (36.7 C), temperature source Oral, resp. rate 20, height 5\' 6"  (1.676 m), weight 134.4 kg (296 lb 4.8 oz), SpO2 98.00%.   Labs:   Lab Results  Component Value Date   WBC 10.5 06/08/2012   HGB 12.5 06/08/2012   HCT 38.0 06/08/2012   MCV 91.1 06/08/2012   PLT 270 06/08/2012    Lab 06/08/12 2218 06/08/12 1906  NA -- 140  K -- 4.7  CL -- 103  CO2 -- --  BUN -- 13  CREATININE 0.82 --  CALCIUM -- --  PROT -- --  BILITOT -- --  ALKPHOS -- --  ALT -- --  AST -- --  GLUCOSE -- 133*   Lipid Panel     Component Value Date/Time   CHOL 173 06/09/2012 0350   TRIG 118 06/09/2012 0350   HDL 42 06/09/2012 0350   CHOLHDL 4.1 06/09/2012 0350   VLDL 24 06/09/2012 0350   LDLCALC 107* 06/09/2012 0350    Cardiac Panel (last 3 results)  Basename 06/09/12 0834 06/09/12 0350 06/08/12 2215  CKTOTAL 126 54 59  CKMB 2.0 1.6 1.5  TROPONINI <0.30 <0.30 <0.30  RELINDX 1.6 RELATIVE INDEX IS INVALID RELATIVE INDEX IS INVALID    Radiology: Dg Chest Portable 1 View  06/08/2012  *RADIOLOGY REPORT*  Clinical Data: Chest pain  PORTABLE CHEST - 1 VIEW  Comparison: 12/15/2005  Findings: Borderline cardiomegaly noted.  No acute  infiltrate or pleural effusion.  No pulmonary edema.  Bony thorax is stable .  IMPRESSION: No active disease.  No significant change.  Original Report Authenticated By: Natasha Mead, M.D.    EKG:NSR, normal intervals. No ischemia.   FOLLOW UP PLANS AND APPOINTMENTS  Medication List  As of 06/09/2012 10:25 AM   TAKE these medications         acetaminophen 500 MG tablet   Commonly known as: TYLENOL   Take 1,000 mg by mouth every 6 (six) hours as needed. For pain      DULoxetine 30 MG capsule   Commonly known as: CYMBALTA   Take 1 capsule (30 mg total) by mouth daily.      metoprolol succinate 50 MG 24 hr tablet   Commonly known as: TOPROL-XL   Take 50 mg by mouth every morning. Take with or immediately following a meal.      nitroGLYCERIN 0.4 MG SL tablet   Commonly known as: NITROSTAT   Place 0.4 mg under the tongue every 5 (five) minutes as needed. For chest pain      omeprazole 20 MG tablet   Commonly known as: PRILOSEC OTC   Take 20 mg by mouth daily.           Follow-up Information    Follow up  with Alva Garnet., MD in 2 weeks. (Patient to f/u on Thursday July 11th at 12 noon.)    Contact information:   [address] [place] [address] [phone]       Follow up with Pamella Pert, MD in 3 weeks. (Patient to call today on discharge. )    Contact information:   [address]  [place] [address] [phone]           Pamella Pert, MD 06/09/2012, 10:25 AM

## 2012-06-09 NOTE — Progress Notes (Signed)
DC orders received.  Patient stable with no S/S of distress.  Medication and discharge info reviewed with patient and patient's family.  Patient DC home. Nolon Nations

## 2012-06-09 NOTE — Progress Notes (Signed)
Utilization review completed.  

## 2012-06-09 NOTE — Discharge Instructions (Signed)
Do not return to work for one week until  06/16/12.

## 2012-06-11 LAB — TSH: TSH: 2.043 u[IU]/mL (ref 0.350–4.500)

## 2013-03-29 ENCOUNTER — Other Ambulatory Visit: Payer: Self-pay

## 2013-03-29 DIAGNOSIS — Z1231 Encounter for screening mammogram for malignant neoplasm of breast: Secondary | ICD-10-CM

## 2013-04-27 ENCOUNTER — Ambulatory Visit
Admission: RE | Admit: 2013-04-27 | Discharge: 2013-04-27 | Disposition: A | Discharge status: Home / Self Care | Payer: BC Managed Care – PPO | Source: Ambulatory Visit

## 2013-04-27 DIAGNOSIS — Z1231 Encounter for screening mammogram for malignant neoplasm of breast: Secondary | ICD-10-CM

## 2013-08-26 LAB — BASIC METABOLIC PANEL
BUN: 17 mg/dL (ref 4–21)
Creatinine: 0.9 mg/dL (ref 0.5–1.1)
Glucose: 101 mg/dL
Potassium: 4.7 mmol/L (ref 3.4–5.3)
Sodium: 139 mmol/L (ref 137–147)

## 2013-08-26 LAB — HEPATIC FUNCTION PANEL
ALT: 15 U/L (ref 7–35)
AST: 13 U/L (ref 13–35)
Alkaline Phosphatase: 65 U/L (ref 25–125)
Bilirubin, Total: 0.3 mg/dL

## 2014-04-01 LAB — HM MAMMOGRAPHY: HM Mammogram: NORMAL

## 2014-07-26 DIAGNOSIS — M1710 Unilateral primary osteoarthritis, unspecified knee: Secondary | ICD-10-CM | POA: Diagnosis not present

## 2014-07-26 DIAGNOSIS — M25569 Pain in unspecified knee: Secondary | ICD-10-CM | POA: Diagnosis not present

## 2014-08-01 ENCOUNTER — Other Ambulatory Visit: Payer: Self-pay

## 2014-08-01 DIAGNOSIS — Z1231 Encounter for screening mammogram for malignant neoplasm of breast: Secondary | ICD-10-CM

## 2014-08-02 ENCOUNTER — Ambulatory Visit
Admission: RE | Admit: 2014-08-02 | Discharge: 2014-08-02 | Disposition: A | Discharge status: Home / Self Care | Payer: Medicare Other | Source: Ambulatory Visit

## 2014-08-02 DIAGNOSIS — Z1231 Encounter for screening mammogram for malignant neoplasm of breast: Secondary | ICD-10-CM

## 2014-08-08 DIAGNOSIS — E559 Vitamin D deficiency, unspecified: Secondary | ICD-10-CM | POA: Diagnosis not present

## 2014-08-08 DIAGNOSIS — Z0000 Encounter for general adult medical examination without abnormal findings: Secondary | ICD-10-CM | POA: Diagnosis not present

## 2014-08-08 DIAGNOSIS — R7309 Other abnormal glucose: Secondary | ICD-10-CM | POA: Diagnosis not present

## 2014-08-08 DIAGNOSIS — R635 Abnormal weight gain: Secondary | ICD-10-CM | POA: Diagnosis not present

## 2014-08-08 DIAGNOSIS — I10 Essential (primary) hypertension: Secondary | ICD-10-CM | POA: Diagnosis not present

## 2014-08-09 DIAGNOSIS — H10509 Unspecified blepharoconjunctivitis, unspecified eye: Secondary | ICD-10-CM | POA: Diagnosis not present

## 2014-08-16 DIAGNOSIS — H01009 Unspecified blepharitis unspecified eye, unspecified eyelid: Secondary | ICD-10-CM | POA: Diagnosis not present

## 2014-08-31 DIAGNOSIS — M1710 Unilateral primary osteoarthritis, unspecified knee: Secondary | ICD-10-CM | POA: Diagnosis not present

## 2014-08-31 DIAGNOSIS — R7309 Other abnormal glucose: Secondary | ICD-10-CM | POA: Diagnosis not present

## 2014-09-28 DIAGNOSIS — M1711 Unilateral primary osteoarthritis, right knee: Secondary | ICD-10-CM | POA: Diagnosis not present

## 2014-09-28 DIAGNOSIS — M1712 Unilateral primary osteoarthritis, left knee: Secondary | ICD-10-CM | POA: Diagnosis not present

## 2014-10-06 DIAGNOSIS — M1712 Unilateral primary osteoarthritis, left knee: Secondary | ICD-10-CM | POA: Diagnosis not present

## 2014-11-09 DIAGNOSIS — M1711 Unilateral primary osteoarthritis, right knee: Secondary | ICD-10-CM | POA: Diagnosis not present

## 2014-11-09 DIAGNOSIS — M1712 Unilateral primary osteoarthritis, left knee: Secondary | ICD-10-CM | POA: Diagnosis not present

## 2014-11-21 DIAGNOSIS — M1711 Unilateral primary osteoarthritis, right knee: Secondary | ICD-10-CM | POA: Diagnosis not present

## 2014-11-21 DIAGNOSIS — M1712 Unilateral primary osteoarthritis, left knee: Secondary | ICD-10-CM | POA: Diagnosis not present

## 2014-11-21 DIAGNOSIS — M172 Bilateral post-traumatic osteoarthritis of knee: Secondary | ICD-10-CM | POA: Diagnosis not present

## 2014-11-28 DIAGNOSIS — M170 Bilateral primary osteoarthritis of knee: Secondary | ICD-10-CM | POA: Diagnosis not present

## 2014-11-28 DIAGNOSIS — M1711 Unilateral primary osteoarthritis, right knee: Secondary | ICD-10-CM | POA: Diagnosis not present

## 2014-11-28 DIAGNOSIS — M1712 Unilateral primary osteoarthritis, left knee: Secondary | ICD-10-CM | POA: Diagnosis not present

## 2015-03-20 DIAGNOSIS — J40 Bronchitis, not specified as acute or chronic: Secondary | ICD-10-CM | POA: Diagnosis not present

## 2015-03-20 DIAGNOSIS — J069 Acute upper respiratory infection, unspecified: Secondary | ICD-10-CM | POA: Diagnosis not present

## 2015-03-20 DIAGNOSIS — J029 Acute pharyngitis, unspecified: Secondary | ICD-10-CM | POA: Diagnosis not present

## 2015-03-26 DIAGNOSIS — J0100 Acute maxillary sinusitis, unspecified: Secondary | ICD-10-CM | POA: Diagnosis not present

## 2015-04-02 ENCOUNTER — Encounter (INDEPENDENT_AMBULATORY_CARE_PROVIDER_SITE_OTHER): Payer: Self-pay

## 2015-04-02 ENCOUNTER — Ambulatory Visit (INDEPENDENT_AMBULATORY_CARE_PROVIDER_SITE_OTHER): Payer: Medicare Other | Admitting: Nurse Practitioner

## 2015-04-02 ENCOUNTER — Encounter: Payer: Self-pay | Admitting: Nurse Practitioner

## 2015-04-02 VITALS — BP 148/82 | HR 85 | Temp 98.5°F | Resp 14

## 2015-04-02 DIAGNOSIS — M1990 Unspecified osteoarthritis, unspecified site: Secondary | ICD-10-CM

## 2015-04-02 DIAGNOSIS — G4730 Sleep apnea, unspecified: Secondary | ICD-10-CM

## 2015-04-02 DIAGNOSIS — E119 Type 2 diabetes mellitus without complications: Secondary | ICD-10-CM

## 2015-04-02 DIAGNOSIS — K219 Gastro-esophageal reflux disease without esophagitis: Secondary | ICD-10-CM

## 2015-04-02 DIAGNOSIS — I10 Essential (primary) hypertension: Secondary | ICD-10-CM | POA: Diagnosis not present

## 2015-04-02 DIAGNOSIS — F411 Generalized anxiety disorder: Secondary | ICD-10-CM

## 2015-04-02 MED ORDER — METOPROLOL SUCCINATE ER 50 MG PO TB24: 50.0000 mg | ORAL_TABLET | Freq: Every morning | ORAL | Status: DC

## 2015-04-02 NOTE — Patient Instructions (Addendum)
Welcome to [facility]!  See you at your earliest availability for diabetes check up.

## 2015-04-02 NOTE — Progress Notes (Signed)
Pre visit review using our clinic review tool, if applicable. No additional management support is needed unless otherwise documented below in the visit note. 

## 2015-04-02 NOTE — Progress Notes (Signed)
Subjective:    Patient ID: Julie Rush, female    DOB: April 12, 1951, 64 y.o.   MRN: [id number]  HPI  Julie Rush is a 64 yo female establishing care.   1) New pt info:  Immunizations- UTD  Mammogram- 2015   Pap- 2013, Still has ovaries, partial hysterectomy   Bone Density- Wants to put on hold   Colonoscopy- 2015 normal   Eye Exam- 2015 normal   Dental Exam- 2015 every 6 months   2) Chronic Problems-  Arthritis- knees/back/fingers tylenol/Aleve   Anxiety- Stable on medication   Diabetes- No recent check up- borderline   GERD- triggers- fried foods, acidic foods   Sleep Apnea- 2013 borderline dx   HTN- stable on metorpolol   Muscle Spasms- stable on robaxin   Dr. 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  HENT: Negative for tinnitus and trouble swallowing.   Eyes: Negative for visual disturbance.  Respiratory: Negative for chest tightness, shortness of breath and wheezing.   Cardiovascular: Negative for chest pain, palpitations and leg swelling.  Gastrointestinal: Negative for nausea, vomiting and diarrhea.  Endocrine: Negative for polydipsia, polyphagia and polyuria.  Musculoskeletal: Positive for myalgias and arthralgias.  Skin: Negative for rash.  Neurological: Negative for dizziness, weakness, numbness and headaches.  Hematological: Does not bruise/bleed easily.  Psychiatric/Behavioral: Negative for suicidal ideas and sleep disturbance. The patient is nervous/anxious.    Past Medical History  Diagnosis Date  . Hypertension   . GERD (gastroesophageal reflux disease)   . PONV (postoperative nausea and vomiting)   . Anginal pain   . Pneumonia   . Exertional dyspnea   . Sleep apnea 2013    "dx'd borderline"  . H/O hiatal hernia   . History of stomach  ulcers 1980's  . Headache(784.0)   . Migraines   . Arthritis     "knees, back, fingers, shoulders, neck"  . Chronic lower back pain   . Depression 06/08/12    "related to not getting definitive dx of what's wrong"  . Type II diabetes mellitus 2013    "borderline"    History   Social History  . Marital Status: Married    Spouse Name: N/A  . Number of Children: N/A  . Years of Education: N/A   Occupational History  . Not on file.   Social History Main Topics  . Smoking status: Former Smoker -- 2.00 packs/day for 14 years    Quit date: 11/08/1975  . Smokeless tobacco: Never Used  . Alcohol Use: No  . Drug Use: No  . Sexual Activity:    Partners: Male     Comment: Husband   Other Topics Concern  . Not on file   Social History Narrative   In process of retiring    Lives with husband    Grown children- 2 daughters    2 grandsons    Pets- goats and dogs    Right handed    Caffeine- 1 soda a day, decaff coffee and tea other times    Enjoys reading     Past Surgical History  Procedure Laterality Date  . Appendectomy  1960's  . Cholecystectomy  2006  . Vaginal hysterectomy  1981  . Breast biopsy      right  .  Lumbar disc surgery  1981  . Hernia repair      "@ least 3 abdominal hernia repairs"  . Exploratory laparotomy  late 1990's    "blocked colon S/P hernia repair"    Family History  Problem Relation Age of Onset  . Arthritis Mother   . Cancer Father     prostate  . Hyperlipidemia Father   . Alcohol abuse Father   . Arthritis Father   . Heart disease Father   . Stroke Father   . Hypertension Father   . Diabetes Father   . Cancer Sister     ovary  . Arthritis Sister   . Cancer Brother     thyroid  . Arthritis Brother   . Arthritis Maternal Grandmother   . Arthritis Maternal Grandfather   . Cancer Sister     breast  . Arthritis Sister     Allergies  Allergen Reactions  . Codeine Nausea And Vomiting  . Prednisone Palpitations  . Sulfa  Antibiotics Itching and Rash  . Trimethoprim Hives    Current Outpatient Prescriptions on File Prior to Visit  Medication Sig Dispense Refill  . acetaminophen (TYLENOL) 500 MG tablet Take 1,000 mg by mouth every 6 (six) hours as needed. For pain    . omeprazole (PRILOSEC OTC) 20 MG tablet Take 20 mg by mouth daily.    . DULoxetine (CYMBALTA) 30 MG capsule Take 1 capsule (30 mg total) by mouth daily. 30 capsule 11   No current facility-administered medications on file prior to visit.       Objective:   Physical Exam  Constitutional: She is oriented to person, place, and time. She appears well-developed and well-nourished. No distress.  BP 148/82 mmHg  Pulse 85  Temp(Src) 98.5 F (36.9 C) (Oral)  Resp 14  SpO2 95%  LMP     HENT:  Head: Normocephalic and atraumatic.  Right Ear: External ear normal.  Left Ear: External ear normal.  Eyes: Right eye exhibits no discharge. Left eye exhibits no discharge. No scleral icterus.  Neck: Normal range of motion. Neck supple.  Cardiovascular: Normal rate, regular rhythm and normal heart sounds.  Exam reveals no gallop and no friction rub.   No murmur heard. Pulmonary/Chest: Effort normal and breath sounds normal. No respiratory distress. She has no wheezes. She has no rales. She exhibits no tenderness.  Abdominal:  Morbidly obese  Lymphadenopathy:    She has no cervical adenopathy.  Neurological: She is alert and oriented to person, place, and time. No cranial nerve deficit. She exhibits normal muscle tone. Coordination normal.  Skin: Skin is warm and dry. No rash noted. She is not diaphoretic.  Psychiatric: She has a normal mood and affect. Her behavior is normal. Judgment and thought content normal.      Assessment & Plan:

## 2015-04-08 DIAGNOSIS — G4730 Sleep apnea, unspecified: Secondary | ICD-10-CM | POA: Insufficient documentation

## 2015-04-08 DIAGNOSIS — M1990 Unspecified osteoarthritis, unspecified site: Secondary | ICD-10-CM | POA: Insufficient documentation

## 2015-04-08 DIAGNOSIS — K219 Gastro-esophageal reflux disease without esophagitis: Secondary | ICD-10-CM | POA: Insufficient documentation

## 2015-04-08 DIAGNOSIS — F411 Generalized anxiety disorder: Secondary | ICD-10-CM | POA: Insufficient documentation

## 2015-04-08 NOTE — Assessment & Plan Note (Signed)
2013 she reports she was borderline. Will obtain records

## 2015-04-08 NOTE — Assessment & Plan Note (Signed)
Reports most joints- knees, back, fingers ect... Taking robaxin for muscle spasms, tylenol and aleve.

## 2015-04-08 NOTE — Assessment & Plan Note (Signed)
Stable on prilosec. Trigger foods- fried/acidic foods. Not avoiding these, but reports trying to cut back. Will follow.

## 2015-04-08 NOTE — Assessment & Plan Note (Signed)
Stable with diet and exercise she reports. She will need full work up at a separate visit. Asked her to make an appointment at her earliest convenience.

## 2015-04-08 NOTE — Assessment & Plan Note (Signed)
Stable on Bupropion.

## 2015-04-08 NOTE — Assessment & Plan Note (Signed)
Re-order metoprolol 50 mg daily (XL). Will follow.

## 2015-04-08 NOTE — Assessment & Plan Note (Signed)
Pt does not have a specific diet or exercise regimen. Will discuss at diabetes F/U visit.

## 2015-04-16 ENCOUNTER — Ambulatory Visit (INDEPENDENT_AMBULATORY_CARE_PROVIDER_SITE_OTHER): Payer: Medicare Other | Admitting: Nurse Practitioner

## 2015-04-16 ENCOUNTER — Encounter: Payer: Self-pay | Admitting: Nurse Practitioner

## 2015-04-16 VITALS — BP 138/88 | HR 71 | Temp 97.7°F | Resp 14 | Ht 65.0 in | Wt 282.0 lb

## 2015-04-16 DIAGNOSIS — E785 Hyperlipidemia, unspecified: Secondary | ICD-10-CM

## 2015-04-16 DIAGNOSIS — E119 Type 2 diabetes mellitus without complications: Secondary | ICD-10-CM

## 2015-04-16 DIAGNOSIS — I10 Essential (primary) hypertension: Secondary | ICD-10-CM

## 2015-04-16 DIAGNOSIS — K219 Gastro-esophageal reflux disease without esophagitis: Secondary | ICD-10-CM

## 2015-04-16 DIAGNOSIS — M1990 Unspecified osteoarthritis, unspecified site: Secondary | ICD-10-CM

## 2015-04-16 LAB — COMPREHENSIVE METABOLIC PANEL
ALT: 10 U/L (ref 0–35)
AST: 15 U/L (ref 0–37)
Albumin: 3.7 g/dL (ref 3.5–5.2)
Alkaline Phosphatase: 70 U/L (ref 39–117)
BUN: 15 mg/dL (ref 6–23)
CO2: 29 mEq/L (ref 19–32)
Calcium: 9.3 mg/dL (ref 8.4–10.5)
Chloride: 103 mEq/L (ref 96–112)
Creatinine, Ser: 0.79 mg/dL (ref 0.40–1.20)
GFR: 77.46 mL/min (ref >60.00)
Glucose, Bld: 101 mg/dL — ABNORMAL HIGH (ref 70–99)
Potassium: 4.2 mEq/L (ref 3.5–5.1)
Sodium: 137 mEq/L (ref 135–145)
Total Bilirubin: 0.5 mg/dL (ref 0.2–1.2)
Total Protein: 6.5 g/dL (ref 6.0–8.3)

## 2015-04-16 LAB — LIPID PANEL
Cholesterol: 194 mg/dL (ref 0–200)
HDL: 43.9 mg/dL (ref >39.00)
LDL Cholesterol: 123 mg/dL — ABNORMAL HIGH (ref 0–99)
NonHDL: 150.1
Total CHOL/HDL Ratio: 4
Triglycerides: 135 mg/dL (ref 0.0–149.0)
VLDL: 27 mg/dL (ref 0.0–40.0)

## 2015-04-16 LAB — MICROALBUMIN / CREATININE URINE RATIO
Creatinine,U: 137.4 mg/dL
Microalb Creat Ratio: 0.9 mg/g (ref 0.0–30.0)
Microalb, Ur: 1.2 mg/dL (ref 0.0–1.9)

## 2015-04-16 LAB — HEMOGLOBIN A1C: Hgb A1c MFr Bld: 6.2 % (ref 4.6–6.5)

## 2015-04-16 NOTE — Assessment & Plan Note (Signed)
Pt getting injections of Supartz. Sees Dr. Maureen Ralphs. Encouraged weight reduction and water aerobics.

## 2015-04-16 NOTE — Assessment & Plan Note (Signed)
Encouraged weight loss. Pt would like to see a nutritionist for her diabetes. Will follow after results.

## 2015-04-16 NOTE — Progress Notes (Signed)
Pre visit review using our clinic review tool, if applicable. No additional management support is needed unless otherwise documented below in the visit note. 

## 2015-04-16 NOTE — Assessment & Plan Note (Signed)
Stable on omeprazole 20 mg daily. Knows some food triggers.

## 2015-04-16 NOTE — Patient Instructions (Signed)
Please visit the lab before leaving today.   We will make your next appointment based on results from today.   Mucinex plain- add to the robitussin.

## 2015-04-16 NOTE — Assessment & Plan Note (Signed)
Obtain Lipid panel today.

## 2015-04-16 NOTE — Assessment & Plan Note (Signed)
Obtain A1c and urine microalbumin today. Foot exam normal today.

## 2015-04-16 NOTE — Assessment & Plan Note (Signed)
Stable on Metoprolol 50 mg daily. Will follow.

## 2015-04-16 NOTE — Progress Notes (Signed)
Subjective:    Patient ID: Julie Rush, female    DOB: [birth year], 64 y.o.   MRN: [id number]  HPI  Julie Rush is a 64 yo female here for a 2 week follow up of Diabetes and GERD.   1) Foot exam will do today eye exam- She reports was oct/nov. Last year.   Silver sneakers- insurance covers, checking into this  Diet- Would like to see a nutritionist.   2) GERD- acidic foods; omeprazole 20 mg daily   3) HTN- compliant with medications.   4) Coughing- Urgent care before last visit, still coughing  Robitussin DM Afrin occasionally  Finished abx 2 weeks ago   5) Knee OA- unable to stand more than 5-10 min, getting worse, 10 years, Sees Dr. Maureen Ralphs, Jacklyn Shell last round  Has tried water aerobics in the past   Review of Systems  Constitutional: Negative for fever, chills, diaphoresis and fatigue.  Respiratory: Positive for cough. Negative for chest tightness, shortness of breath and wheezing.   Cardiovascular: Negative for chest pain, palpitations and leg swelling.  Gastrointestinal: Negative for nausea, vomiting, abdominal pain, diarrhea and abdominal distention.  Endocrine: Negative for polydipsia, polyphagia and polyuria.  Musculoskeletal: Positive for arthralgias.       Knee and foot pain  Skin: Negative for rash.  Neurological: Negative for dizziness, weakness, numbness and headaches.  Psychiatric/Behavioral: The patient is not nervous/anxious.    Past Medical History  Diagnosis Date  . Hypertension   . GERD (gastroesophageal reflux disease)   . PONV (postoperative nausea and vomiting)   . Anginal pain   . Pneumonia   . Exertional dyspnea   . Sleep apnea 2013    "dx'd borderline"  . H/O hiatal hernia   . History of stomach ulcers 1980's  . Headache(784.0)   . Migraines   . Arthritis     "knees, back, fingers, shoulders, neck"  . Chronic lower back pain   . Depression 06/08/12    "related to not getting definitive dx of what's wrong"  . Type II diabetes  mellitus 2013    "borderline"    History   Social History  . Marital Status: Married    Spouse Name: N/A  . Number of Children: N/A  . Years of Education: N/A   Occupational History  . Not on file.   Social History Main Topics  . Smoking status: Former Smoker -- 2.00 packs/day for 14 years    Quit date: 11/08/1975  . Smokeless tobacco: Never Used  . Alcohol Use: No  . Drug Use: No  . Sexual Activity:    Partners: Male     Comment: Husband   Other Topics Concern  . Not on file   Social History Narrative   In process of retiring    Lives with husband    Grown children- 2 daughters    2 grandsons    Pets- goats and dogs    Right handed    Caffeine- 1 soda a day, decaff coffee and tea other times    Enjoys reading     Past Surgical History  Procedure Laterality Date  . Appendectomy  1960's  . Cholecystectomy  2006  . Vaginal hysterectomy  1981  . Breast biopsy      right  . Lumbar disc surgery  1981  . Hernia repair      "@ least 3 abdominal hernia repairs"  . Exploratory laparotomy  late 1990's    "blocked colon S/P hernia repair"  Family History  Problem Relation Age of Onset  . Arthritis Mother   . Cancer Father     prostate  . Hyperlipidemia Father   . Alcohol abuse Father   . Arthritis Father   . Heart disease Father   . Stroke Father   . Hypertension Father   . Diabetes Father   . Cancer Sister     ovary  . Arthritis Sister   . Cancer Brother     thyroid  . Arthritis Brother   . Arthritis Maternal Grandmother   . Arthritis Maternal Grandfather   . Cancer Sister     breast  . Arthritis Sister     Allergies  Allergen Reactions  . Codeine Nausea And Vomiting  . Prednisone Palpitations  . Sulfa Antibiotics Itching and Rash  . Trimethoprim Hives    Current Outpatient Prescriptions on File Prior to Visit  Medication Sig Dispense Refill  . acetaminophen (TYLENOL) 500 MG tablet Take 1,000 mg by mouth every 6 (six) hours as needed.  For pain    . buPROPion (ZYBAN) 150 MG 12 hr tablet Take 150 mg by mouth 2 (two) times daily.    . methocarbamol (ROBAXIN) 500 MG tablet Take 500 mg by mouth daily.    . metoprolol succinate (TOPROL-XL) 50 MG 24 hr tablet Take 1 tablet (50 mg total) by mouth every morning. Take with or immediately following a meal. 30 tablet 2  . omeprazole (PRILOSEC OTC) 20 MG tablet Take 20 mg by mouth daily.     No current facility-administered medications on file prior to visit.      Objective:   Physical Exam  Constitutional: She is oriented to person, place, and time. She appears well-developed and well-nourished. No distress.  BP 138/88 mmHg  Pulse 71  Temp(Src) 97.7 F (36.5 C) (Oral)  Resp 14  Ht 5\' 5"  (1.651 m)  Wt 282 lb (127.914 kg)  BMI 46.93 kg/m2  SpO2 97%   HENT:  Head: Normocephalic and atraumatic.  Right Ear: External ear normal.  Left Ear: External ear normal.  Cardiovascular: Normal rate, regular rhythm and normal heart sounds.  Exam reveals no gallop and no friction rub.   No murmur heard. Pulmonary/Chest: Effort normal and breath sounds normal. No respiratory distress. She has no wheezes. She has no rales. She exhibits no tenderness.  Musculoskeletal: Normal range of motion. She exhibits tenderness. She exhibits no edema.  Knees are tender generally to palpation, feet are not when sitting.   Neurological: She is alert and oriented to person, place, and time. No cranial nerve deficit. She exhibits normal muscle tone. Coordination normal.  Skin: Skin is warm and dry. No rash noted. She is not diaphoretic.  Psychiatric: She has a normal mood and affect. Her behavior is normal. Judgment and thought content normal.      Assessment & Plan:

## 2015-06-11 ENCOUNTER — Other Ambulatory Visit: Payer: Self-pay | Admitting: Nurse Practitioner

## 2015-06-27 ENCOUNTER — Other Ambulatory Visit: Payer: Self-pay | Admitting: Nurse Practitioner

## 2015-07-17 ENCOUNTER — Other Ambulatory Visit: Payer: Self-pay | Admitting: Internal Medicine

## 2015-07-17 DIAGNOSIS — Z1231 Encounter for screening mammogram for malignant neoplasm of breast: Secondary | ICD-10-CM

## 2015-07-18 DIAGNOSIS — M170 Bilateral primary osteoarthritis of knee: Secondary | ICD-10-CM | POA: Diagnosis not present

## 2015-07-18 DIAGNOSIS — M1711 Unilateral primary osteoarthritis, right knee: Secondary | ICD-10-CM | POA: Diagnosis not present

## 2015-07-18 DIAGNOSIS — M1712 Unilateral primary osteoarthritis, left knee: Secondary | ICD-10-CM | POA: Diagnosis not present

## 2015-07-25 DIAGNOSIS — M170 Bilateral primary osteoarthritis of knee: Secondary | ICD-10-CM | POA: Diagnosis not present

## 2015-07-25 DIAGNOSIS — M1711 Unilateral primary osteoarthritis, right knee: Secondary | ICD-10-CM | POA: Diagnosis not present

## 2015-07-25 DIAGNOSIS — M1712 Unilateral primary osteoarthritis, left knee: Secondary | ICD-10-CM | POA: Diagnosis not present

## 2015-08-01 DIAGNOSIS — M1711 Unilateral primary osteoarthritis, right knee: Secondary | ICD-10-CM | POA: Diagnosis not present

## 2015-08-01 DIAGNOSIS — M1712 Unilateral primary osteoarthritis, left knee: Secondary | ICD-10-CM | POA: Diagnosis not present

## 2015-08-22 ENCOUNTER — Ambulatory Visit
Admission: RE | Admit: 2015-08-22 | Discharge: 2015-08-22 | Disposition: A | Discharge status: Home / Self Care | Payer: Medicare Other | Source: Ambulatory Visit | Attending: Internal Medicine | Admitting: Internal Medicine

## 2015-08-22 DIAGNOSIS — Z1231 Encounter for screening mammogram for malignant neoplasm of breast: Secondary | ICD-10-CM

## 2015-09-04 ENCOUNTER — Other Ambulatory Visit: Payer: Self-pay | Admitting: Nurse Practitioner

## 2015-12-29 ENCOUNTER — Other Ambulatory Visit: Payer: Self-pay | Admitting: Nurse Practitioner

## 2016-01-10 DIAGNOSIS — M1712 Unilateral primary osteoarthritis, left knee: Secondary | ICD-10-CM | POA: Diagnosis not present

## 2016-01-10 DIAGNOSIS — M1711 Unilateral primary osteoarthritis, right knee: Secondary | ICD-10-CM | POA: Diagnosis not present

## 2016-01-10 DIAGNOSIS — M170 Bilateral primary osteoarthritis of knee: Secondary | ICD-10-CM | POA: Diagnosis not present

## 2016-02-14 DIAGNOSIS — M170 Bilateral primary osteoarthritis of knee: Secondary | ICD-10-CM | POA: Diagnosis not present

## 2016-02-14 DIAGNOSIS — M1711 Unilateral primary osteoarthritis, right knee: Secondary | ICD-10-CM | POA: Diagnosis not present

## 2016-02-14 DIAGNOSIS — M1712 Unilateral primary osteoarthritis, left knee: Secondary | ICD-10-CM | POA: Diagnosis not present

## 2016-02-22 DIAGNOSIS — M170 Bilateral primary osteoarthritis of knee: Secondary | ICD-10-CM | POA: Diagnosis not present

## 2016-02-22 DIAGNOSIS — M1711 Unilateral primary osteoarthritis, right knee: Secondary | ICD-10-CM | POA: Diagnosis not present

## 2016-02-22 DIAGNOSIS — M1712 Unilateral primary osteoarthritis, left knee: Secondary | ICD-10-CM | POA: Diagnosis not present

## 2016-02-29 DIAGNOSIS — M170 Bilateral primary osteoarthritis of knee: Secondary | ICD-10-CM | POA: Diagnosis not present

## 2016-02-29 DIAGNOSIS — M1711 Unilateral primary osteoarthritis, right knee: Secondary | ICD-10-CM | POA: Diagnosis not present

## 2016-02-29 DIAGNOSIS — M1712 Unilateral primary osteoarthritis, left knee: Secondary | ICD-10-CM | POA: Diagnosis not present

## 2016-03-13 DIAGNOSIS — H2513 Age-related nuclear cataract, bilateral: Secondary | ICD-10-CM | POA: Diagnosis not present

## 2016-04-08 DIAGNOSIS — M170 Bilateral primary osteoarthritis of knee: Secondary | ICD-10-CM | POA: Diagnosis not present

## 2016-07-27 ENCOUNTER — Other Ambulatory Visit: Payer: Self-pay | Admitting: Nurse Practitioner

## 2016-07-28 NOTE — Telephone Encounter (Signed)
Patient last seen on 03/2015. Last refilled 12/2015 with 5 refills. Please advise?

## 2016-10-05 ENCOUNTER — Other Ambulatory Visit: Payer: Self-pay | Admitting: Nurse Practitioner

## 2016-10-06 NOTE — Telephone Encounter (Signed)
Refilled 09/04/15. Pt last seen by Julie Rush 04/16/15. Please advise?

## 2016-10-22 DIAGNOSIS — Z1283 Encounter for screening for malignant neoplasm of skin: Secondary | ICD-10-CM | POA: Diagnosis not present

## 2016-10-22 DIAGNOSIS — B351 Tinea unguium: Secondary | ICD-10-CM | POA: Diagnosis not present

## 2016-11-05 ENCOUNTER — Other Ambulatory Visit: Payer: Self-pay | Admitting: Family Medicine

## 2016-11-20 ENCOUNTER — Encounter: Payer: Self-pay | Admitting: Family

## 2016-11-20 ENCOUNTER — Ambulatory Visit (INDEPENDENT_AMBULATORY_CARE_PROVIDER_SITE_OTHER): Payer: Medicare Other | Admitting: Family

## 2016-11-20 VITALS — BP 145/80 | HR 71 | Temp 97.9°F | Ht 65.0 in | Wt 284.8 lb

## 2016-11-20 DIAGNOSIS — J209 Acute bronchitis, unspecified: Secondary | ICD-10-CM | POA: Diagnosis not present

## 2016-11-20 DIAGNOSIS — R252 Cramp and spasm: Secondary | ICD-10-CM

## 2016-11-20 DIAGNOSIS — F411 Generalized anxiety disorder: Secondary | ICD-10-CM

## 2016-11-20 DIAGNOSIS — I10 Essential (primary) hypertension: Secondary | ICD-10-CM | POA: Diagnosis not present

## 2016-11-20 DIAGNOSIS — Z1231 Encounter for screening mammogram for malignant neoplasm of breast: Secondary | ICD-10-CM | POA: Diagnosis not present

## 2016-11-20 DIAGNOSIS — Z1239 Encounter for other screening for malignant neoplasm of breast: Secondary | ICD-10-CM

## 2016-11-20 MED ORDER — METOPROLOL SUCCINATE ER 50 MG PO TB24: ORAL_TABLET | ORAL | 1 refills | Status: DC

## 2016-11-20 MED ORDER — BUPROPION HCL ER (SR) 150 MG PO TB12: 150.0000 mg | ORAL_TABLET | Freq: Two times a day (BID) | ORAL | 5 refills | Status: DC

## 2016-11-20 MED ORDER — METHOCARBAMOL 500 MG PO TABS: 500.0000 mg | ORAL_TABLET | Freq: Every day | ORAL | 1 refills | Status: DC

## 2016-11-20 MED ORDER — ALBUTEROL SULFATE HFA 108 (90 BASE) MCG/ACT IN AERS: 2.0000 | INHALATION_SPRAY | Freq: Four times a day (QID) | RESPIRATORY_TRACT | 1 refills | Status: DC | PRN

## 2016-11-20 NOTE — Patient Instructions (Addendum)
Due for physical. Please make physical appointment and fasting labs.   Please check BP when you get home and after you take medication. Please call if higher than 140/90.  We placed a referral. Mammogram this year. I asked that you call one the below locations and schedule this when it is convenient for you.   If you have dense breasts, you may ask for 3D mammogram over the traditional 2D mammogram as new evidence suggest 3D is superior. Please note that NOT all insurance companies cover 3D and you may have to pay a higher copay. You may call your insurance company to further clarify your benefits.   Options for [place]  [place], [place]  * [facility], [place] * Note if you ask for 3D mammogram at this location, you must request [place], [place] location*

## 2016-11-20 NOTE — Progress Notes (Signed)
Subjective:    Patient ID: Julie Rush, female    DOB: 1951/01/29, 65 y.o.   MRN: [id number]  CC: Julie Rush is a 65 y.o. female who presents today for follow up.   HPI: DM- diet controlled.   HTN- Didn't take BP medication this morning. Averages at home 140/70. Denies exertional chest pain or pressure, numbness or tingling radiating to left arm or jaw, palpitations, dizziness, frequent headaches, changes in vision, or shortness of breath.    GERD- takes prilosec PRN.   Anxiety- well controlled on Wellbutrin.  Lower extremity muscle cramps- Some weeks has cramps, other times goes months without cramps. Has been going on for years and no known cause. Notices cramps flare when she drinks something acidic (such as lemon juice). Notices when not drinking water, has more cramps.   Cough and congestion the past week. Unchanged. No fever, chills. She also states occasionally has been short of breath with her cough. Has not tried any medications.       No longer does pap smear. Colonoscopy due 2018 ( last 2013) and believes she had polps. No records.   HISTORY:  Past Medical History:  Diagnosis Date  . Anginal pain ([place])   . Arthritis    "knees, back, fingers, shoulders, neck"  . Chronic lower back pain   . Depression 06/08/12   "related to not getting definitive dx of what's wrong"  . Exertional dyspnea   . GERD (gastroesophageal reflux disease)   . H/O hiatal hernia   . Headache(784.0)   . History of stomach ulcers 1980's  . Hypertension   . Migraines   . Pneumonia   . PONV (postoperative nausea and vomiting)   . Sleep apnea 2013   "dx'd borderline"  . Type II diabetes mellitus ([place]) 2013   "borderline"   Past Surgical History:  Procedure Laterality Date  . APPENDECTOMY  1960's  . BREAST BIOPSY     right  . CHOLECYSTECTOMY  2006  . EXPLORATORY LAPAROTOMY  late 1990's   "blocked colon S/P hernia repair"  . HERNIA REPAIR     "@ least 3 abdominal hernia  repairs"  . [place]  . VAGINAL HYSTERECTOMY  1981   Family History  Problem Relation Age of Onset  . Arthritis Mother   . Cancer Father     prostate  . Hyperlipidemia Father   . Alcohol abuse Father   . Arthritis Father   . Heart disease Father   . Stroke Father   . Hypertension Father   . Diabetes Father   . Cancer Sister     ovary  . Arthritis Sister   . Cancer Brother     thyroid  . Arthritis Brother   . Cancer Sister     breast  . Arthritis Sister   . Arthritis Maternal Grandmother   . Arthritis Maternal Grandfather     Allergies: Codeine; Prednisone; Sulfa antibiotics; and Trimethoprim Current Outpatient Prescriptions on File Prior to Visit  Medication Sig Dispense Refill  . acetaminophen (TYLENOL) 500 MG tablet Take 1,000 mg by mouth every 6 (six) hours as needed. For pain    . buPROPion (WELLBUTRIN SR) 150 MG 12 hr tablet TAKE 1 TABLET BY MOUTH TWICE DAILY 60 tablet 0  . metoprolol succinate (TOPROL-XL) 50 MG 24 hr tablet TAKE 1 TABLET BY MOUTH EVERY MORNING WITH OR IMMEDITATELY FOLLOWING A MEAL 30 tablet 5  . omeprazole (PRILOSEC OTC) 20 MG tablet Take 20 mg  by mouth daily.     No current facility-administered medications on file prior to visit.     Social History  Substance Use Topics  . Smoking status: Former Smoker    Packs/day: 2.00    Years: 14.00    Quit date: 11/08/1975  . Smokeless tobacco: Never Used  . Alcohol use No    Review of Systems  Constitutional: Negative for chills and fever.  HENT: Positive for congestion.   Respiratory: Positive for cough and shortness of breath.   Cardiovascular: Negative for chest pain and palpitations.  Gastrointestinal: Negative for nausea and vomiting.      Objective:    BP (!) 145/80   Pulse 71   Temp 97.9 F (36.6 C) (Oral)   Ht 5\' 5"  (1.651 m)   Wt 284 lb 12.8 oz (129.2 kg)   SpO2 99%   BMI 47.39 kg/m  BP Readings from Last 3 Encounters:  11/20/16 (!) 145/80  04/16/15 138/88    04/02/15 (!) 148/82   Wt Readings from Last 3 Encounters:  11/20/16 284 lb 12.8 oz (129.2 kg)  04/16/15 282 lb (127.9 kg)  06/08/12 296 lb 4.8 oz (134.4 kg)    Physical Exam  Constitutional: She appears well-developed and well-nourished.  Eyes: Conjunctivae are normal.  Cardiovascular: Normal rate, regular rhythm, normal heart sounds and normal pulses.   Pulmonary/Chest: Effort normal and breath sounds normal. She has no wheezes. She has no rhonchi. She has no rales.  Neurological: She is alert.  Skin: Skin is warm and dry.  Psychiatric: She has a normal mood and affect. Her speech is normal and behavior is normal. Thought content normal.  Vitals reviewed.      Assessment & Plan:   Problem List Items Addressed This Visit      Cardiovascular and Mediastinum   Essential hypertension, benign    Initially elevated today. However then normotensive once rechecked. Patient did not take medication today. Advised her check blood pressure daily and to call us if values greater than 140/90.        Respiratory   Acute bronchitis    Symptoms consistent with viral bronchitis. Advised conservative treatment including albuterol. Return precautions given.      Relevant Medications   albuterol (PROVENTIL HFA) 108 (90 Base) MCG/ACT inhaler     Other   Generalized anxiety disorder    Controlled on Wellbutrin. Continue current regimen.      Muscle cramp    Intermittent, frequency not increasing. No clear etiology. Agreed to refill muscle relaxant. Patient will notify me if cramps become more frequent, more severe For further evaluation.      Relevant Medications   methocarbamol (ROBAXIN) 500 MG tablet    Other Visit Diagnoses    Screening for breast cancer    -  Primary   Relevant Orders   MM DIGITAL SCREENING BILATERAL       I have changed Ms. Booze's methocarbamol. I am also having her start on albuterol. Additionally, I am having her maintain her omeprazole,  acetaminophen, metoprolol succinate, and buPROPion.   Meds ordered this encounter  Medications  . methocarbamol (ROBAXIN) 500 MG tablet    Sig: Take 1 tablet (500 mg total) by mouth daily.    Dispense:  30 tablet    Refill:  1    Order Specific Question:   Supervising Provider    Answer:   Deborra Medina L [[id number]]  . albuterol (PROVENTIL HFA) 108 (90 Base) MCG/ACT inhaler    Sig:  Inhale 2 puffs into the lungs every 6 (six) hours as needed for wheezing or shortness of breath.    Dispense:  1 Inhaler    Refill:  1    Order Specific Question:   Supervising Provider    Answer:   Crecencio Mc [[id number]]    Return precautions given.   Risks, benefits, and alternatives of the medications and treatment plan prescribed today were discussed, and patient expressed understanding.   Education regarding symptom management and diagnosis given to patient on AVS.  Continue to follow with Mable Paris, FNP for routine health maintenance.   Linna Hoff and I agreed with plan.   Mable Paris, FNP

## 2016-11-20 NOTE — Addendum Note (Signed)
Addended by: Burnard Hawthorne on: 11/20/2016 03:23 PM   Modules accepted: Orders

## 2016-11-20 NOTE — Assessment & Plan Note (Signed)
Controlled on Wellbutrin. Continue current regimen.

## 2016-11-20 NOTE — Assessment & Plan Note (Signed)
Initially elevated today. However then normotensive once rechecked. Patient did not take medication today. Advised her check blood pressure daily and to call us if values greater than 140/90.

## 2016-11-20 NOTE — Progress Notes (Signed)
Pre visit review using our clinic review tool, if applicable. No additional management support is needed unless otherwise documented below in the visit note. 

## 2016-11-20 NOTE — Assessment & Plan Note (Addendum)
Afebrile. SaO2 99%. Symptoms consistent with viral bronchitis. Advised conservative treatment including albuterol. Return precautions given.

## 2016-11-20 NOTE — Assessment & Plan Note (Signed)
Intermittent, frequency not increasing. No clear etiology. Agreed to refill muscle relaxant. Patient will notify me if cramps become more frequent, more severe For further evaluation.

## 2016-12-17 ENCOUNTER — Ambulatory Visit: Payer: Medicare Other | Admitting: Family

## 2016-12-23 ENCOUNTER — Encounter: Payer: Self-pay | Admitting: Family

## 2016-12-25 ENCOUNTER — Other Ambulatory Visit: Payer: Self-pay | Admitting: Family

## 2016-12-25 DIAGNOSIS — Z1231 Encounter for screening mammogram for malignant neoplasm of breast: Secondary | ICD-10-CM

## 2017-01-06 ENCOUNTER — Other Ambulatory Visit (INDEPENDENT_AMBULATORY_CARE_PROVIDER_SITE_OTHER): Payer: Medicare Other

## 2017-01-06 DIAGNOSIS — I10 Essential (primary) hypertension: Secondary | ICD-10-CM | POA: Diagnosis not present

## 2017-01-06 LAB — COMPREHENSIVE METABOLIC PANEL
ALT: 15 U/L (ref 0–35)
AST: 17 U/L (ref 0–37)
Albumin: 3.7 g/dL (ref 3.5–5.2)
Alkaline Phosphatase: 59 U/L (ref 39–117)
BUN: 20 mg/dL (ref 6–23)
CO2: 30 mEq/L (ref 19–32)
Calcium: 9.3 mg/dL (ref 8.4–10.5)
Chloride: 103 mEq/L (ref 96–112)
Creatinine, Ser: 0.96 mg/dL (ref 0.40–1.20)
GFR: 61.53 mL/min (ref >60.00)
Glucose, Bld: 119 mg/dL — ABNORMAL HIGH (ref 70–99)
Potassium: 4.4 mEq/L (ref 3.5–5.1)
Sodium: 138 mEq/L (ref 135–145)
Total Bilirubin: 0.5 mg/dL (ref 0.2–1.2)
Total Protein: 6.4 g/dL (ref 6.0–8.3)

## 2017-01-06 LAB — LIPID PANEL
Cholesterol: 191 mg/dL (ref 0–200)
HDL: 45.8 mg/dL (ref >39.00)
LDL Cholesterol: 123 mg/dL — ABNORMAL HIGH (ref 0–99)
NonHDL: 144.96
Total CHOL/HDL Ratio: 4
Triglycerides: 111 mg/dL (ref 0.0–149.0)
VLDL: 22.2 mg/dL (ref 0.0–40.0)

## 2017-01-06 LAB — CBC WITH DIFFERENTIAL/PLATELET
Basophils Absolute: 0.1 10*3/uL (ref 0.0–0.1)
Basophils Relative: 0.8 % (ref 0.0–3.0)
Eosinophils Absolute: 0.3 10*3/uL (ref 0.0–0.7)
Eosinophils Relative: 3.5 % (ref 0.0–5.0)
HCT: 39.2 % (ref 36.0–46.0)
Hemoglobin: 12.9 g/dL (ref 12.0–15.0)
Lymphocytes Relative: 31.6 % (ref 12.0–46.0)
Lymphs Abs: 2.4 10*3/uL (ref 0.7–4.0)
MCHC: 33 g/dL (ref 30.0–36.0)
MCV: 91.7 fl (ref 78.0–100.0)
Monocytes Absolute: 0.4 10*3/uL (ref 0.1–1.0)
Monocytes Relative: 4.9 % (ref 3.0–12.0)
Neutro Abs: 4.6 10*3/uL (ref 1.4–7.7)
Neutrophils Relative %: 59.2 % (ref 43.0–77.0)
Platelets: 275 10*3/uL (ref 150.0–400.0)
RBC: 4.27 Mil/uL (ref 3.87–5.11)
RDW: 13.7 % (ref 11.5–15.5)
WBC: 7.7 10*3/uL (ref 4.0–10.5)

## 2017-01-06 LAB — TSH: TSH: 2.38 u[IU]/mL (ref 0.35–4.50)

## 2017-01-06 LAB — VITAMIN D 25 HYDROXY (VIT D DEFICIENCY, FRACTURES): VITD: 14.03 ng/mL — ABNORMAL LOW (ref 30.00–100.00)

## 2017-01-06 LAB — HEMOGLOBIN A1C: Hgb A1c MFr Bld: 6.4 % (ref 4.6–6.5)

## 2017-01-07 LAB — HEPATITIS C ANTIBODY: HCV Ab: NEGATIVE

## 2017-01-08 ENCOUNTER — Encounter: Payer: Medicare Other | Admitting: Family

## 2017-01-12 ENCOUNTER — Encounter: Payer: Self-pay | Admitting: Family

## 2017-01-12 ENCOUNTER — Ambulatory Visit (INDEPENDENT_AMBULATORY_CARE_PROVIDER_SITE_OTHER): Payer: Medicare Other | Admitting: Family

## 2017-01-12 VITALS — BP 144/90 | HR 76 | Temp 97.7°F | Ht 65.0 in | Wt 289.0 lb

## 2017-01-12 DIAGNOSIS — I10 Essential (primary) hypertension: Secondary | ICD-10-CM

## 2017-01-12 DIAGNOSIS — E119 Type 2 diabetes mellitus without complications: Secondary | ICD-10-CM | POA: Diagnosis not present

## 2017-01-12 DIAGNOSIS — E785 Hyperlipidemia, unspecified: Secondary | ICD-10-CM | POA: Diagnosis not present

## 2017-01-12 DIAGNOSIS — R079 Chest pain, unspecified: Secondary | ICD-10-CM | POA: Diagnosis not present

## 2017-01-12 DIAGNOSIS — Z0000 Encounter for general adult medical examination without abnormal findings: Secondary | ICD-10-CM | POA: Insufficient documentation

## 2017-01-12 DIAGNOSIS — Z13820 Encounter for screening for osteoporosis: Secondary | ICD-10-CM

## 2017-01-12 MED ORDER — METOPROLOL SUCCINATE ER 25 MG PO TB24: 25.0000 mg | ORAL_TABLET | Freq: Every day | ORAL | 3 refills | Status: DC

## 2017-01-12 MED ORDER — PNEUMOCOCCAL 13-VAL CONJ VACC IM SUSP: 0.5000 mL | INTRAMUSCULAR | Status: DC

## 2017-01-12 MED ORDER — LOVASTATIN 40 MG PO TABS: 40.0000 mg | ORAL_TABLET | Freq: Every day | ORAL | 1 refills | Status: DC

## 2017-01-12 NOTE — Assessment & Plan Note (Addendum)
CVD risk 22%. LDL not at goal. Start moderate intensity statin based on insurance, recommend lovastatin. Will follow

## 2017-01-12 NOTE — Progress Notes (Signed)
Subjective:    Patient ID: Julie Rush, female    DOB: [birth year], 66 y.o.   MRN: [id number]  CC: Julie Rush is a 66 y.o. female who presents today for physical exam.    HPI: Feeling well.   past 3 months, has had intermittent 'chest tightness'. No pain today. Not crushing chest pain. No diaphoresis during episodes. Can occur when sitting or when walking. Pain is mild.Denies exertional chest pain or pressure, numbness or tingling radiating to left arm or jaw, palpitations, dizziness, frequent headaches, changes in vision, or shortness of breath. Years ago normal stress test.         Colorectal Cancer Screening: UTD 4 years, unable to see full report Per patient, had a polpy, come back in 5 years. Due 2019. Breast Cancer Screening: Mammogram due, scheduled Cervical Cancer Screening: partial Hysterectomy. No vaginal bleeding. No GYN cancer. Normal pap smear.  Bone Health screening/DEXA for 65+: ordered Lung Cancer Screening: Doesn't have 30 year pack year history and age > 51 years. Smoked 14 years.         Tetanus - UTD        Pneumococcal - unsure which pneumococcal vaccine.   Labs: Screening labs today. Exercise: No exercise.  Alcohol use: none Smoking/tobacco use: former smoker.   Wears seat belt: Yes.  HISTORY:  Past Medical History:  Diagnosis Date  . Anginal pain ([place])   . Arthritis    "knees, back, fingers, shoulders, neck"  . Chronic lower back pain   . Depression 06/08/12   "related to not getting definitive dx of what's wrong"  . Exertional dyspnea   . GERD (gastroesophageal reflux disease)   . H/O hiatal hernia   . Headache(784.0)   . History of stomach ulcers 1980's  . Hypertension   . Migraines   . Pneumonia   . PONV (postoperative nausea and vomiting)   . Sleep apnea 2013   "dx'd borderline"  . Type II diabetes mellitus ([place]) 2013   "borderline"    Past Surgical History:  Procedure Laterality Date  . APPENDECTOMY  1960's  . BREAST  BIOPSY     right  . CHOLECYSTECTOMY  2006  . EXPLORATORY LAPAROTOMY  late 1990's   "blocked colon S/P hernia repair"  . HERNIA REPAIR     "@ least 3 abdominal hernia repairs"  . [place]  . VAGINAL HYSTERECTOMY  1981   Family History  Problem Relation Age of Onset  . Arthritis Mother   . Cancer Father     prostate  . Hyperlipidemia Father   . Alcohol abuse Father   . Arthritis Father   . Heart disease Father   . Stroke Father   . Hypertension Father   . Diabetes Father   . Cancer Sister     ovary  . Arthritis Sister   . Cancer Brother     thyroid  . Arthritis Brother   . Cancer Sister     breast  . Arthritis Sister   . Arthritis Maternal Grandmother   . Arthritis Maternal Grandfather       ALLERGIES: Codeine; Prednisone; Sulfa antibiotics; and Trimethoprim  Current Outpatient Prescriptions on File Prior to Visit  Medication Sig Dispense Refill  . acetaminophen (TYLENOL) 500 MG tablet Take 1,000 mg by mouth every 6 (six) hours as needed. For pain    . albuterol (PROVENTIL HFA) 108 (90 Base) MCG/ACT inhaler Inhale 2 puffs into the lungs every 6 (six) hours as  needed for wheezing or shortness of breath. 1 Inhaler 1  . buPROPion (WELLBUTRIN SR) 150 MG 12 hr tablet Take 1 tablet (150 mg total) by mouth 2 (two) times daily. 60 tablet 5  . methocarbamol (ROBAXIN) 500 MG tablet Take 1 tablet (500 mg total) by mouth daily. 30 tablet 1  . metoprolol succinate (TOPROL-XL) 50 MG 24 hr tablet TAKE 1 TABLET BY MOUTH EVERY MORNING WITH OR IMMEDITATELY FOLLOWING A MEAL 90 tablet 1  . omeprazole (PRILOSEC OTC) 20 MG tablet Take 20 mg by mouth daily.     No current facility-administered medications on file prior to visit.     Social History  Substance Use Topics  . Smoking status: Former Smoker    Packs/day: 2.00    Years: 14.00    Quit date: 11/08/1975  . Smokeless tobacco: Never Used  . Alcohol use No    Review of Systems  Constitutional: Negative for  chills, fever and unexpected weight change.  HENT: Negative for congestion.   Respiratory: Positive for chest tightness. Negative for cough.   Cardiovascular: Negative for chest pain, palpitations and leg swelling.  Gastrointestinal: Negative for nausea and vomiting.  Musculoskeletal: Negative for arthralgias and myalgias.  Skin: Negative for rash.  Neurological: Negative for headaches.  Hematological: Negative for adenopathy.  Psychiatric/Behavioral: Negative for confusion.      Objective:    BP (!) 144/90   Pulse 76   Temp 97.7 F (36.5 C) (Oral)   Ht 5\' 5"  (1.651 m)   Wt 289 lb (131.1 kg)   SpO2 97%   BMI 48.09 kg/m   BP Readings from Last 3 Encounters:  01/12/17 (!) 144/90  11/20/16 (!) 145/80  04/16/15 138/88   Wt Readings from Last 3 Encounters:  01/12/17 289 lb (131.1 kg)  11/20/16 284 lb 12.8 oz (129.2 kg)  04/16/15 282 lb (127.9 kg)    Physical Exam  Constitutional: She appears well-developed and well-nourished.  Eyes: Conjunctivae are normal.  Neck: No thyroid mass and no thyromegaly present.  Cardiovascular: Normal rate, regular rhythm, normal heart sounds and normal pulses.   Pulmonary/Chest: Effort normal and breath sounds normal. She has no wheezes. She has no rhonchi. She has no rales. Right breast exhibits no inverted nipple, no mass, no nipple discharge, no skin change and no tenderness. Left breast exhibits no inverted nipple, no mass, no nipple discharge, no skin change and no tenderness. Breasts are symmetrical.  CBE performed.   Lymphadenopathy:       Head (right side): No submental, no submandibular, no tonsillar, no preauricular, no posterior auricular and no occipital adenopathy present.       Head (left side): No submental, no submandibular, no tonsillar, no preauricular, no posterior auricular and no occipital adenopathy present.    She has no cervical adenopathy.       Right cervical: No superficial cervical, no deep cervical and no posterior  cervical adenopathy present.      Left cervical: No superficial cervical, no deep cervical and no posterior cervical adenopathy present.    She has no axillary adenopathy.  Neurological: She is alert.  Skin: Skin is warm and dry.  Psychiatric: She has a normal mood and affect. Her speech is normal and behavior is normal. Thought content normal.  Vitals reviewed.      Assessment & Plan:   Problem List Items Addressed This Visit      Cardiovascular and Mediastinum   Essential hypertension, benign    Elevated. We'll increase metoprolol  to 25 mg to a total of 75 mg a day. Follow-up in 3 months.      Relevant Medications   lovastatin (MEVACOR) 40 MG tablet   metoprolol succinate (TOPROL-XL) 25 MG 24 hr tablet     Endocrine   Type 2 diabetes mellitus, controlled ([place])    Diet controlled. A1c stable.      Relevant Medications   lovastatin (MEVACOR) 40 MG tablet     Other   Hyperlipidemia    CVD risk 22%. LDL not at goal. Start moderate intensity statin based on insurance, recommend lovastatin. Will follow      Relevant Medications   lovastatin (MEVACOR) 40 MG tablet   metoprolol succinate (TOPROL-XL) 25 MG 24 hr tablet   Routine physical examination - Primary    UTD on colonoscopy, patient is due in 2019. Mammogram is scheduled. Breast exam performed. Patient had a partial hysterectomy and declines pelvic exam today in the absence of symptoms and preference. DEXA scan ordered. Does not meet criteria for lung cancer screening. Up-to-date on tetanus vaccine. Due for Prevnar vaccine as unsure of pneumococcal vaccine history. Will do at follow up. Encouraged exercise program particularly water aerobics once patient has seen cardiology.      Relevant Medications   lovastatin (MEVACOR) 40 MG tablet   Chest pain    Concern for ischemic disease due to concurrent comorbid comorbid conditions. No chest pain today. Patient has had self-limited episodes. Referral to cardiology for stress  test. Education provided that if pain recurs, patient will call 911 for concern for MI.      Relevant Orders   Ambulatory referral to Cardiology    Other Visit Diagnoses    Screening for osteoporosis       Relevant Orders   DG Bone Density       I am having Ms. Oliva start on lovastatin and metoprolol succinate. I am also having her maintain her omeprazole, acetaminophen, methocarbamol, albuterol, buPROPion, and metoprolol succinate.   Meds ordered this encounter  Medications  . DISCONTD: pneumococcal 13-valent conjugate vaccine (PREVNAR 13) injection 0.5 mL  . lovastatin (MEVACOR) 40 MG tablet    Sig: Take 1 tablet (40 mg total) by mouth at bedtime.    Dispense:  90 tablet    Refill:  1    Order Specific Question:   Supervising Provider    Answer:   Deborra Medina L [[id number]]  . metoprolol succinate (TOPROL-XL) 25 MG 24 hr tablet    Sig: Take 1 tablet (25 mg total) by mouth daily.    Dispense:  90 tablet    Refill:  3    Order Specific Question:   Supervising Provider    Answer:   Crecencio Mc [[id number]]    Return precautions given.   Risks, benefits, and alternatives of the medications and treatment plan prescribed today were discussed, and patient expressed understanding.   Education regarding symptom management and diagnosis given to patient on AVS.   Continue to follow with Mable Paris, FNP for routine health maintenance.   Linna Hoff and I agreed with plan.   Mable Paris, FNP

## 2017-01-12 NOTE — Assessment & Plan Note (Signed)
Concern for ischemic disease due to concurrent comorbid comorbid conditions. No chest pain today. Patient has had self-limited episodes. Referral to cardiology for stress test. Education provided that if pain recurs, patient will call 911 for concern for MI.

## 2017-01-12 NOTE — Assessment & Plan Note (Signed)
Diet controlled. A1c stable.

## 2017-01-12 NOTE — Progress Notes (Signed)
Pre visit review using our clinic review tool, if applicable. No additional management support is needed unless otherwise documented below in the visit note. 

## 2017-01-12 NOTE — Assessment & Plan Note (Signed)
Elevated. We'll increase metoprolol to 25 mg to a total of 75 mg a day. Follow-up in 3 months.

## 2017-01-12 NOTE — Assessment & Plan Note (Addendum)
UTD on colonoscopy, patient is due in 2019. Mammogram is scheduled. Breast exam performed. Patient had a partial hysterectomy and declines pelvic exam today in the absence of symptoms and preference. DEXA scan ordered. Does not meet criteria for lung cancer screening. Up-to-date on tetanus vaccine. Due for Prevnar vaccine as unsure of pneumococcal vaccine history. Will do at follow up. Encouraged exercise program particularly water aerobics once patient has seen cardiology.

## 2017-01-12 NOTE — Patient Instructions (Addendum)
Spot check BP couple times per week, goal is less than 140/90  Referral to cardiology for stress test. If you have another episode of chest tightness, please call 911.  Your vitamin D level is low.  You may add over the counter vitamin D 1000 units by mouth daily for 12 weeks. Then you may call our office for a follow up visit and we can recheck level.   For post menopausal women, guidelines recommend a diet with 1200 mg of Calcium per day. If you are eating calcium rich foods, you do not need a calcium supplement. The body better absorbs the calcium that you eat over supplementation. If you do supplement, I recommend not supplementing the full 1200 mg/ day as this can lead to increased risk of cardiovascular disease. I recommend Calcium Citrate over the counter, and you may take a total of 600 to 800 mg per day in divided doses with meals for best absorption.   Also remember that exercise is a great medicine for maintain and preserve bone health. Advise moderate exercise for 30 minutes , 3 times per week.   Health Maintenance, Female Introduction Adopting a healthy lifestyle and getting preventive care can go a long way to promote health and wellness. Talk with your health care provider about what schedule of regular examinations is right for you. This is a good chance for you to check in with your provider about disease prevention and staying healthy. In between checkups, there are plenty of things you can do on your own. Experts have done a lot of research about which lifestyle changes and preventive measures are most likely to keep you healthy. Ask your health care provider for more information. Weight and diet Eat a healthy diet  Be sure to include plenty of vegetables, fruits, low-fat dairy products, and lean protein.  Do not eat a lot of foods high in solid fats, added sugars, or salt.  Get regular exercise. This is one of the most important things you can do for your health.  Most  adults should exercise for at least 150 minutes each week. The exercise should increase your heart rate and make you sweat (moderate-intensity exercise).  Most adults should also do strengthening exercises at least twice a week. This is in addition to the moderate-intensity exercise. Maintain a healthy weight  Body mass index (BMI) is a measurement that can be used to identify possible weight problems. It estimates body fat based on height and weight. Your health care provider can help determine your BMI and help you achieve or maintain a healthy weight.  For females 65 years of age and older:  A BMI below 18.5 is considered underweight.  A BMI of 18.5 to 24.9 is normal.  A BMI of 25 to 29.9 is considered overweight.  A BMI of 30 and above is considered obese. Watch levels of cholesterol and blood lipids  You should start having your blood tested for lipids and cholesterol at 68 years of age, then have this test every 5 years.  You may need to have your cholesterol levels checked more often if:  Your lipid or cholesterol levels are high.  You are older than 68 years of age.  You are at high risk for heart disease. Cancer screening Lung Cancer  Lung cancer screening is recommended for adults 43-73 years old who are at high risk for lung cancer because of a history of smoking.  A yearly low-dose CT scan of the lungs is recommended for  people who:  Currently smoke.  Have quit within the past 15 years.  Have at least a 30-pack-year history of smoking. A pack year is smoking an average of one pack of cigarettes a day for 1 year.  Yearly screening should continue until it has been 15 years since you quit.  Yearly screening should stop if you develop a health problem that would prevent you from having lung cancer treatment. Breast Cancer  Practice breast self-awareness. This means understanding how your breasts normally appear and feel.  It also means doing regular breast  self-exams. Let your health care provider know about any changes, no matter how small.  If you are in your 20s or 30s, you should have a clinical breast exam (CBE) by a health care provider every 1-3 years as part of a regular health exam.  If you are 66 or older, have a CBE every year. Also consider having a breast X-ray (mammogram) every year.  If you have a family history of breast cancer, talk to your health care provider about genetic screening.  If you are at high risk for breast cancer, talk to your health care provider about having an MRI and a mammogram every year.  Breast cancer gene (BRCA) assessment is recommended for women who have family members with BRCA-related cancers. BRCA-related cancers include:  Breast.  Ovarian.  Tubal.  Peritoneal cancers.  Results of the assessment will determine the need for genetic counseling and BRCA1 and BRCA2 testing. Cervical Cancer  Your health care provider may recommend that you be screened regularly for cancer of the pelvic organs (ovaries, uterus, and vagina). This screening involves a pelvic examination, including checking for microscopic changes to the surface of your cervix (Pap test). You may be encouraged to have this screening done every 3 years, beginning at age 38.  For women ages 74-65, health care providers may recommend pelvic exams and Pap testing every 3 years, or they may recommend the Pap and pelvic exam, combined with testing for human papilloma virus (HPV), every 5 years. Some types of HPV increase your risk of cervical cancer. Testing for HPV may also be done on women of any age with unclear Pap test results.  Other health care providers may not recommend any screening for nonpregnant women who are considered low risk for pelvic cancer and who do not have symptoms. Ask your health care provider if a screening pelvic exam is right for you.  If you have had past treatment for cervical cancer or a condition that could lead  to cancer, you need Pap tests and screening for cancer for at least 20 years after your treatment. If Pap tests have been discontinued, your risk factors (such as having a new sexual partner) need to be reassessed to determine if screening should resume. Some women have medical problems that increase the chance of getting cervical cancer. In these cases, your health care provider may recommend more frequent screening and Pap tests. Colorectal Cancer  This type of cancer can be detected and often prevented.  Routine colorectal cancer screening usually begins at 68 years of age and continues through 68 years of age.  Your health care provider may recommend screening at an earlier age if you have risk factors for colon cancer.  Your health care provider may also recommend using home test kits to check for hidden blood in the stool.  A small camera at the end of a tube can be used to examine your colon directly (sigmoidoscopy or  colonoscopy). This is done to check for the earliest forms of colorectal cancer.  Routine screening usually begins at age 27.  Direct examination of the colon should be repeated every 5-10 years through 68 years of age. However, you may need to be screened more often if early forms of precancerous polyps or small growths are found. Skin Cancer  Check your skin from head to toe regularly.  Tell your health care provider about any new moles or changes in moles, especially if there is a change in a mole's shape or color.  Also tell your health care provider if you have a mole that is larger than the size of a pencil eraser.  Always use sunscreen. Apply sunscreen liberally and repeatedly throughout the day.  Protect yourself by wearing long sleeves, pants, a wide-brimmed hat, and sunglasses whenever you are outside. Heart disease, diabetes, and high blood pressure  High blood pressure causes heart disease and increases the risk of stroke. High blood pressure is more  likely to develop in:  People who have blood pressure in the high end of the normal range (130-139/85-89 mm Hg).  People who are overweight or obese.  People who are African American.  If you are 27-80 years of age, have your blood pressure checked every 3-5 years. If you are 9 years of age or older, have your blood pressure checked every year. You should have your blood pressure measured twice-once when you are at a hospital or clinic, and once when you are not at a hospital or clinic. Record the average of the two measurements. To check your blood pressure when you are not at a hospital or clinic, you can use:  An automated blood pressure machine at a pharmacy.  A home blood pressure monitor.  If you are between 53 years and 74 years old, ask your health care provider if you should take aspirin to prevent strokes.  Have regular diabetes screenings. This involves taking a blood sample to check your fasting blood sugar level.  If you are at a normal weight and have a low risk for diabetes, have this test once every three years after 68 years of age.  If you are overweight and have a high risk for diabetes, consider being tested at a younger age or more often. Preventing infection Hepatitis B  If you have a higher risk for hepatitis B, you should be screened for this virus. You are considered at high risk for hepatitis B if:  You were born in a country where hepatitis B is common. Ask your health care provider which countries are considered high risk.  Your parents were born in a high-risk country, and you have not been immunized against hepatitis B (hepatitis B vaccine).  You have HIV or AIDS.  You use needles to inject street drugs.  You live with someone who has hepatitis B.  You have had sex with someone who has hepatitis B.  You get hemodialysis treatment.  You take certain medicines for conditions, including cancer, organ transplantation, and autoimmune  conditions. Hepatitis C  Blood testing is recommended for:  Everyone born from [birth year] through 1967.  Anyone with known risk factors for hepatitis C. Sexually transmitted infections (STIs)  You should be screened for sexually transmitted infections (STIs) including gonorrhea and chlamydia if:  You are sexually active and are younger than 68 years of age.  You are older than 68 years of age and your health care provider tells you that you are at  risk for this type of infection.  Your sexual activity has changed since you were last screened and you are at an increased risk for chlamydia or gonorrhea. Ask your health care provider if you are at risk.  If you do not have HIV, but are at risk, it may be recommended that you take a prescription medicine daily to prevent HIV infection. This is called pre-exposure prophylaxis (PrEP). You are considered at risk if:  You are sexually active and do not regularly use condoms or know the HIV status of your partner(s).  You take drugs by injection.  You are sexually active with a partner who has HIV. Talk with your health care provider about whether you are at high risk of being infected with HIV. If you choose to begin PrEP, you should first be tested for HIV. You should then be tested every 3 months for as long as you are taking PrEP. Pregnancy  If you are premenopausal and you may become pregnant, ask your health care provider about preconception counseling.  If you may become pregnant, take 400 to 800 micrograms (mcg) of folic acid every day.  If you want to prevent pregnancy, talk to your health care provider about birth control (contraception). Osteoporosis and menopause  Osteoporosis is a disease in which the bones lose minerals and strength with aging. This can result in serious bone fractures. Your risk for osteoporosis can be identified using a bone density scan.  If you are 79 years of age or older, or if you are at risk for  osteoporosis and fractures, ask your health care provider if you should be screened.  Ask your health care provider whether you should take a calcium or vitamin D supplement to lower your risk for osteoporosis.  Menopause may have certain physical symptoms and risks.  Hormone replacement therapy may reduce some of these symptoms and risks. Talk to your health care provider about whether hormone replacement therapy is right for you. Follow these instructions at home:  Schedule regular health, dental, and eye exams.  Stay current with your immunizations.  Do not use any tobacco products including cigarettes, chewing tobacco, or electronic cigarettes.  If you are pregnant, do not drink alcohol.  If you are breastfeeding, limit how much and how often you drink alcohol.  Limit alcohol intake to no more than 1 drink per day for nonpregnant women. One drink equals 12 ounces of beer, 5 ounces of wine, or 1 ounces of hard liquor.  Do not use street drugs.  Do not share needles.  Ask your health care provider for help if you need support or information about quitting drugs.  Tell your health care provider if you often feel depressed.  Tell your health care provider if you have ever been abused or do not feel safe at home. This information is not intended to replace advice given to you by your health care provider. Make sure you discuss any questions you have with your health care provider. Document Released: 06/23/2011 Document Revised: 05/15/2016 Document Reviewed: 09/11/2015  2017 Elsevier

## 2017-01-19 ENCOUNTER — Ambulatory Visit
Admission: RE | Admit: 2017-01-19 | Discharge: 2017-01-19 | Disposition: A | Discharge status: Home / Self Care | Payer: Medicare Other | Source: Ambulatory Visit | Attending: Family | Admitting: Family

## 2017-01-19 DIAGNOSIS — Z1231 Encounter for screening mammogram for malignant neoplasm of breast: Secondary | ICD-10-CM | POA: Diagnosis not present

## 2017-01-20 ENCOUNTER — Ambulatory Visit: Payer: Medicare Other

## 2017-02-02 DIAGNOSIS — E785 Hyperlipidemia, unspecified: Secondary | ICD-10-CM | POA: Diagnosis not present

## 2017-02-02 DIAGNOSIS — R0609 Other forms of dyspnea: Secondary | ICD-10-CM | POA: Diagnosis not present

## 2017-02-02 DIAGNOSIS — R079 Chest pain, unspecified: Secondary | ICD-10-CM | POA: Diagnosis not present

## 2017-02-04 ENCOUNTER — Encounter: Payer: Self-pay | Admitting: Family

## 2017-02-05 DIAGNOSIS — M170 Bilateral primary osteoarthritis of knee: Secondary | ICD-10-CM | POA: Diagnosis not present

## 2017-02-05 DIAGNOSIS — M1711 Unilateral primary osteoarthritis, right knee: Secondary | ICD-10-CM | POA: Diagnosis not present

## 2017-02-05 DIAGNOSIS — M1712 Unilateral primary osteoarthritis, left knee: Secondary | ICD-10-CM | POA: Diagnosis not present

## 2017-02-09 ENCOUNTER — Encounter: Payer: Self-pay | Admitting: Family

## 2017-02-09 DIAGNOSIS — R0789 Other chest pain: Secondary | ICD-10-CM | POA: Diagnosis not present

## 2017-02-09 DIAGNOSIS — I10 Essential (primary) hypertension: Secondary | ICD-10-CM | POA: Diagnosis not present

## 2017-02-09 DIAGNOSIS — R0602 Shortness of breath: Secondary | ICD-10-CM | POA: Diagnosis not present

## 2017-02-10 DIAGNOSIS — R079 Chest pain, unspecified: Secondary | ICD-10-CM | POA: Diagnosis not present

## 2017-02-10 DIAGNOSIS — R0602 Shortness of breath: Secondary | ICD-10-CM | POA: Diagnosis not present

## 2017-02-12 DIAGNOSIS — M170 Bilateral primary osteoarthritis of knee: Secondary | ICD-10-CM | POA: Diagnosis not present

## 2017-02-18 DIAGNOSIS — M170 Bilateral primary osteoarthritis of knee: Secondary | ICD-10-CM | POA: Diagnosis not present

## 2017-03-16 ENCOUNTER — Ambulatory Visit: Payer: Medicare Other

## 2017-04-30 DIAGNOSIS — Z86010 Personal history of colonic polyps: Secondary | ICD-10-CM | POA: Diagnosis not present

## 2017-04-30 DIAGNOSIS — K219 Gastro-esophageal reflux disease without esophagitis: Secondary | ICD-10-CM | POA: Diagnosis not present

## 2017-04-30 DIAGNOSIS — K5730 Diverticulosis of large intestine without perforation or abscess without bleeding: Secondary | ICD-10-CM | POA: Diagnosis not present

## 2017-04-30 DIAGNOSIS — Z1211 Encounter for screening for malignant neoplasm of colon: Secondary | ICD-10-CM | POA: Diagnosis not present

## 2017-05-19 ENCOUNTER — Other Ambulatory Visit: Payer: Self-pay | Admitting: Family

## 2017-05-19 DIAGNOSIS — F411 Generalized anxiety disorder: Secondary | ICD-10-CM

## 2017-05-28 ENCOUNTER — Other Ambulatory Visit: Payer: Self-pay

## 2017-05-28 DIAGNOSIS — F411 Generalized anxiety disorder: Secondary | ICD-10-CM

## 2017-05-28 MED ORDER — BUPROPION HCL ER (SR) 150 MG PO TB12: 150.0000 mg | ORAL_TABLET | Freq: Two times a day (BID) | ORAL | 1 refills | Status: DC

## 2017-05-28 NOTE — Telephone Encounter (Signed)
Medication has been refilled.

## 2017-06-12 DIAGNOSIS — D123 Benign neoplasm of transverse colon: Secondary | ICD-10-CM | POA: Diagnosis not present

## 2017-06-12 DIAGNOSIS — K635 Polyp of colon: Secondary | ICD-10-CM | POA: Diagnosis not present

## 2017-06-12 DIAGNOSIS — Z1211 Encounter for screening for malignant neoplasm of colon: Secondary | ICD-10-CM | POA: Diagnosis not present

## 2017-06-12 DIAGNOSIS — Z86010 Personal history of colonic polyps: Secondary | ICD-10-CM | POA: Diagnosis not present

## 2017-06-12 DIAGNOSIS — K5730 Diverticulosis of large intestine without perforation or abscess without bleeding: Secondary | ICD-10-CM | POA: Diagnosis not present

## 2017-08-12 ENCOUNTER — Other Ambulatory Visit: Payer: Self-pay | Admitting: Family

## 2017-08-12 DIAGNOSIS — I10 Essential (primary) hypertension: Secondary | ICD-10-CM

## 2017-09-07 ENCOUNTER — Telehealth: Payer: Self-pay | Admitting: Family

## 2017-09-07 NOTE — Telephone Encounter (Signed)
Patient needs appointment scheduled with PCP to FU on Quality Metric for [facility] of BP and MicroAlbumin, please schedule.

## 2017-09-24 DIAGNOSIS — M170 Bilateral primary osteoarthritis of knee: Secondary | ICD-10-CM | POA: Diagnosis not present

## 2017-10-02 DIAGNOSIS — M170 Bilateral primary osteoarthritis of knee: Secondary | ICD-10-CM | POA: Diagnosis not present

## 2017-10-09 DIAGNOSIS — M170 Bilateral primary osteoarthritis of knee: Secondary | ICD-10-CM | POA: Diagnosis not present

## 2017-10-29 ENCOUNTER — Telehealth: Payer: Self-pay

## 2017-10-29 NOTE — Telephone Encounter (Signed)
Copied from [place]. Topic: Appointment Scheduling - Scheduling Inquiry for Clinic >> Oct 29, 2017 10:35 AM Ether Griffins B wrote: Reason for CRM: pt has lump on R breast and wants to be seen. Didn't see anything available in the next few weeks with arnett, scott, tullo. Would like to get worked in.

## 2017-11-02 ENCOUNTER — Encounter: Payer: Self-pay | Admitting: Internal Medicine

## 2017-11-02 ENCOUNTER — Ambulatory Visit (INDEPENDENT_AMBULATORY_CARE_PROVIDER_SITE_OTHER): Payer: Medicare Other | Admitting: Internal Medicine

## 2017-11-02 VITALS — BP 128/78 | HR 71 | Temp 97.5°F | Resp 14 | Ht 65.0 in | Wt 273.6 lb

## 2017-11-02 DIAGNOSIS — E1121 Type 2 diabetes mellitus with diabetic nephropathy: Secondary | ICD-10-CM

## 2017-11-02 DIAGNOSIS — Z23 Encounter for immunization: Secondary | ICD-10-CM | POA: Diagnosis not present

## 2017-11-02 DIAGNOSIS — E785 Hyperlipidemia, unspecified: Secondary | ICD-10-CM

## 2017-11-02 DIAGNOSIS — E119 Type 2 diabetes mellitus without complications: Secondary | ICD-10-CM | POA: Diagnosis not present

## 2017-11-02 DIAGNOSIS — N6310 Unspecified lump in the right breast, unspecified quadrant: Secondary | ICD-10-CM

## 2017-11-02 LAB — COMPREHENSIVE METABOLIC PANEL
ALT: 13 U/L (ref 0–35)
AST: 19 U/L (ref 0–37)
Albumin: 3.8 g/dL (ref 3.5–5.2)
Alkaline Phosphatase: 61 U/L (ref 39–117)
BUN: 13 mg/dL (ref 6–23)
CO2: 29 mEq/L (ref 19–32)
Calcium: 9.6 mg/dL (ref 8.4–10.5)
Chloride: 103 mEq/L (ref 96–112)
Creatinine, Ser: 1.02 mg/dL (ref 0.40–1.20)
GFR: 57.24 mL/min — ABNORMAL LOW (ref >60.00)
Glucose, Bld: 103 mg/dL — ABNORMAL HIGH (ref 70–99)
Potassium: 4.1 mEq/L (ref 3.5–5.1)
Sodium: 137 mEq/L (ref 135–145)
Total Bilirubin: 0.5 mg/dL (ref 0.2–1.2)
Total Protein: 7 g/dL (ref 6.0–8.3)

## 2017-11-02 LAB — MICROALBUMIN / CREATININE URINE RATIO
Creatinine,U: 262.8 mg/dL
Microalb Creat Ratio: 2.6 mg/g (ref 0.0–30.0)
Microalb, Ur: 6.7 mg/dL — ABNORMAL HIGH (ref 0.0–1.9)

## 2017-11-02 LAB — LDL CHOLESTEROL, DIRECT: Direct LDL: 110 mg/dL

## 2017-11-02 LAB — HEMOGLOBIN A1C: Hgb A1c MFr Bld: 6.3 % (ref 4.6–6.5)

## 2017-11-02 MED ORDER — DOXYCYCLINE HYCLATE 100 MG PO TABS: 100.0000 mg | ORAL_TABLET | Freq: Two times a day (BID) | ORAL | 0 refills | Status: DC

## 2017-11-02 NOTE — Progress Notes (Signed)
Subjective:  Patient ID: Julie Rush, female    DOB: 1951/09/01  Age: 66 y.o. MRN: [id number]  CC: The primary encounter diagnosis was Hyperlipidemia, unspecified hyperlipidemia type. Diagnoses of Controlled type 2 diabetes mellitus without complication, unspecified whether long term insulin use ([place]), Need for vaccination with 13-polyvalent pneumococcal conjugate vaccine, Breast mass, right, and Diabetic nephropathy associated with type 2 diabetes mellitus ([place]) were also pertinent to this visit.  HPI Julie Rush presents for evaluation of a persistent lump in her right breast.  Patient noticed it several months ago,  During the summer and has "kept an eye on it.' since then.  It is not tender unless she squeezes it, and feels hard has not changed in size or expressed any fluid.  She has noticed a bluish discoloration in the last two weeks and reports that the lateral side of her breast feels uncomfortable up to the axilla.  Last mammogram was  3D  Done in January  2018: BIRADS 1 .  FH of of BRCA in one sister in her 28's   2) follow up on type 2 diabetes mellitus, diet controlled since diagnosis.  Does not check blood sugars.,  Does not exercise. Follows a low GI diet about 50% of the time. Last A1c Jan 2018    Outpatient Medications Prior to Visit  Medication Sig Dispense Refill  . acetaminophen (TYLENOL) 500 MG tablet Take 1,000 mg by mouth every 6 (six) hours as needed. For pain    . albuterol (PROVENTIL HFA) 108 (90 Base) MCG/ACT inhaler Inhale 2 puffs into the lungs every 6 (six) hours as needed for wheezing or shortness of breath. 1 Inhaler 1  . buPROPion (WELLBUTRIN SR) 150 MG 12 hr tablet Take 1 tablet (150 mg total) by mouth 2 (two) times daily. 180 tablet 1  . methocarbamol (ROBAXIN) 500 MG tablet Take 1 tablet (500 mg total) by mouth daily. 30 tablet 1  . metoprolol succinate (TOPROL-XL) 25 MG 24 hr tablet Take 1 tablet (25 mg total) by mouth daily. 90 tablet 3  .  metoprolol succinate (TOPROL-XL) 50 MG 24 hr tablet TAKE 1 TABLET BY MOUTH EVERY MORNING WITH OR IMMEDIATELY FOLLOWING A MEAL 90 tablet 0  . lovastatin (MEVACOR) 40 MG tablet Take 1 tablet (40 mg total) by mouth at bedtime. 90 tablet 1  . omeprazole (PRILOSEC OTC) 20 MG tablet Take 20 mg by mouth daily.     No facility-administered medications prior to visit.     Review of Systems;  Patient denies headache, fevers, malaise, unintentional weight loss, skin rash, eye pain, sinus congestion and sinus pain, sore throat, dysphagia,  hemoptysis , cough, dyspnea, wheezing, chest pain, palpitations, orthopnea, edema, abdominal pain, nausea, melena, diarrhea, constipation, flank pain, dysuria, hematuria, urinary  Frequency, nocturia, numbness, tingling, seizures,  Focal weakness, Loss of consciousness,  Tremor, insomnia, depression, anxiety, and suicidal ideation.      Objective:  BP 128/78 (BP Location: Left Arm, Patient Position: Sitting, Cuff Size: Large)   Pulse 71   Temp (!) 97.5 F (36.4 C) (Oral)   Resp 14   Ht '5\' 5"'  (1.651 m)   Wt 273 lb 9.6 oz (124.1 kg)   SpO2 97%   BMI 45.53 kg/m   BP Readings from Last 3 Encounters:  11/02/17 128/78  01/12/17 (!) 144/90  11/20/16 (!) 145/80    Wt Readings from Last 3 Encounters:  11/02/17 273 lb 9.6 oz (124.1 kg)  01/12/17 289 lb (131.1 kg)  11/20/16  284 lb 12.8 oz (129.2 kg)    General appearance: alert, cooperative and appears stated age Neck: no adenopathy, no carotid bruit, supple, symmetrical, trachea midline and thyroid not enlarged, symmetric, no tenderness/mass/nodules Back: symmetric, no curvature. ROM normal. No CVA tenderness. Lungs: clear to auscultation bilaterally Heart: regular rate and rhythm, S1, S2 normal, no murmur, click, rub or gallop Breasts: pendulous, no masses,  Small subcutaneous nodule inferior surface of right breast adjacent to nipple at 6:00 position, slight bruising noted . No axillary masses or adenopathy.    Skin: Skin color, texture, turgor normal. No rashes or lesions Lymph nodes: Cervical, supraclavicular, and axillary nodes normal.  Lab Results  Component Value Date   HGBA1C 6.3 11/02/2017   HGBA1C 6.4 01/06/2017   HGBA1C 6.2 04/16/2015    Lab Results  Component Value Date   CREATININE 1.02 11/02/2017   CREATININE 0.96 01/06/2017   CREATININE 0.79 04/16/2015    Lab Results  Component Value Date   WBC 7.7 01/06/2017   HGB 12.9 01/06/2017   HCT 39.2 01/06/2017   PLT 275.0 01/06/2017   GLUCOSE 103 (H) 11/02/2017   CHOL 191 01/06/2017   TRIG 111.0 01/06/2017   HDL 45.80 01/06/2017   LDLDIRECT 110.0 11/02/2017   LDLCALC 123 (H) 01/06/2017   ALT 13 11/02/2017   AST 19 11/02/2017   NA 137 11/02/2017   K 4.1 11/02/2017   CL 103 11/02/2017   CREATININE 1.02 11/02/2017   BUN 13 11/02/2017   CO2 29 11/02/2017   TSH 2.38 01/06/2017   HGBA1C 6.3 11/02/2017   MICROALBUR 6.7 (H) 11/02/2017    Mm Screening Breast Tomo Bilateral  Result Date: 01/19/2017 CLINICAL DATA:  Screening. EXAM: 2D DIGITAL SCREENING BILATERAL MAMMOGRAM WITH CAD AND ADJUNCT TOMO COMPARISON:  Previous exam(s). ACR Breast Density Category b: There are scattered areas of fibroglandular density. FINDINGS: There are no findings suspicious for malignancy. Images were processed with CAD. IMPRESSION: No mammographic evidence of malignancy. A result letter of this screening mammogram will be mailed directly to the patient. RECOMMENDATION: Screening mammogram in one year. (Code:SM-B-01Y) BI-RADS CATEGORY  1: Negative. Electronically Signed   By: Margarette Canada M.D.   On: 01/20/2017 13:48    Assessment & Plan:   Problem List Items Addressed This Visit    Breast mass, right    Exam is consistent with a small epidermal cysts vs infected sebaceous cyst.  Will treat with empiric abx and if it does not resolve,  Ultrasound and diagnostic mammogram vs referral to gen surg       Controlled type 2 diabetes mellitus with  diabetic nephropathy ([place])    Historically diet controlled. However she has microscopic proteinuria and is not taking an ACE inhibitor or an aspirin.  Will recommend both today.  Direct LDL is 110 on lovastatin 40 mg. Will change to higher potency statin   Lab Results  Component Value Date   HGBA1C 6.3 11/02/2017   Lab Results  Component Value Date   MICROALBUR 6.7 (H) 11/02/2017   Lab Results  Component Value Date   CHOL 191 01/06/2017   HDL 45.80 01/06/2017   LDLCALC 123 (H) 01/06/2017   LDLDIRECT 110.0 11/02/2017   TRIG 111.0 01/06/2017   CHOLHDL 4 01/06/2017          Relevant Medications   aspirin EC 81 MG tablet   lisinopril (PRINIVIL,ZESTRIL) 5 MG tablet   atorvastatin (LIPITOR) 20 MG tablet   Hyperlipidemia - Primary   Relevant Medications   aspirin  EC 81 MG tablet   lisinopril (PRINIVIL,ZESTRIL) 5 MG tablet   atorvastatin (LIPITOR) 20 MG tablet   Other Relevant Orders   LDL cholesterol, direct (Completed)    Other Visit Diagnoses    Need for vaccination with 13-polyvalent pneumococcal conjugate vaccine       Relevant Orders   Pneumococcal conjugate vaccine 13-valent IM (Completed)   Diabetic nephropathy associated with type 2 diabetes mellitus ([facility])       Relevant Medications   aspirin EC 81 MG tablet   lisinopril (PRINIVIL,ZESTRIL) 5 MG tablet   atorvastatin (LIPITOR) 20 MG tablet   Other Relevant Orders   Basic metabolic panel      I have discontinued Roshana B. Polhamus's omeprazole and lovastatin. I am also having her start on doxycycline, aspirin EC, lisinopril, and atorvastatin. Additionally, I am having her maintain her acetaminophen, methocarbamol, albuterol, metoprolol succinate, buPROPion, and metoprolol succinate.  Meds ordered this encounter  Medications  . doxycycline (VIBRA-TABS) 100 MG tablet    Sig: Take 1 tablet (100 mg total) 2 (two) times daily by mouth.    Dispense:  14 tablet    Refill:  0  . aspirin EC 81 MG tablet    Sig: Take 1  tablet (81 mg total) daily by mouth.    Dispense:  90 tablet    Refill:  3  . lisinopril (PRINIVIL,ZESTRIL) 5 MG tablet    Sig: Take 1 tablet (5 mg total) daily by mouth.    Dispense:  30 tablet    Refill:  0  . atorvastatin (LIPITOR) 20 MG tablet    Sig: Take 1 tablet (20 mg total) daily by mouth.    Dispense:  90 tablet    Refill:  1    Medications Discontinued During This Encounter  Medication Reason  . omeprazole (PRILOSEC OTC) 20 MG tablet Patient has not taken in last 30 days  . lovastatin (MEVACOR) 40 MG tablet    A total of 25 minutes of face to face time was spent with patient more than half of which was spent in counselling about the above mentioned conditions  and coordination of care    Follow-up: No Follow-up on file.   Crecencio Mc, MD

## 2017-11-02 NOTE — Patient Instructions (Signed)
The lump in your right breast is very superficial (just under the skin) .  I would like to treat you with an antibiotic for a week,  (doxycycline 100 mg twice daily WITH FOOD) TO SEE if it resolves   If it persists, let me know and we will work it up further with an ultrasound   You received the Prevnar vaccine today ( the pneumonia vaccine recommended for all adults)  You are due for diabetes follow up so I have ordered the labs that you are over due for

## 2017-11-03 DIAGNOSIS — N6310 Unspecified lump in the right breast, unspecified quadrant: Secondary | ICD-10-CM | POA: Insufficient documentation

## 2017-11-03 MED ORDER — ATORVASTATIN CALCIUM 20 MG PO TABS: 20.0000 mg | ORAL_TABLET | Freq: Every day | ORAL | 1 refills | Status: DC

## 2017-11-03 MED ORDER — LISINOPRIL 5 MG PO TABS: 5.0000 mg | ORAL_TABLET | Freq: Every day | ORAL | 0 refills | Status: DC

## 2017-11-03 MED ORDER — ASPIRIN EC 81 MG PO TBEC: 81.0000 mg | DELAYED_RELEASE_TABLET | Freq: Every day | ORAL | 3 refills | Status: DC

## 2017-11-03 NOTE — Assessment & Plan Note (Signed)
Historically diet controlled. However she has microscopic proteinuria and is not taking an ACE inhibitor or an aspirin.  Will recommend both today.  Direct LDL is 110 on lovastatin 40 mg. Will change to higher potency statin   Lab Results  Component Value Date   HGBA1C 6.3 11/02/2017   Lab Results  Component Value Date   MICROALBUR 6.7 (H) 11/02/2017   Lab Results  Component Value Date   CHOL 191 01/06/2017   HDL 45.80 01/06/2017   LDLCALC 123 (H) 01/06/2017   LDLDIRECT 110.0 11/02/2017   TRIG 111.0 01/06/2017   CHOLHDL 4 01/06/2017

## 2017-11-03 NOTE — Assessment & Plan Note (Signed)
Exam is consistent with a small epidermal cysts vs infected sebaceous cyst.  Will treat with empiric abx and if it does not resolve,  Ultrasound and diagnostic mammogram vs referral to gen surg

## 2017-11-10 ENCOUNTER — Other Ambulatory Visit (INDEPENDENT_AMBULATORY_CARE_PROVIDER_SITE_OTHER): Payer: Medicare Other

## 2017-11-10 DIAGNOSIS — E1121 Type 2 diabetes mellitus with diabetic nephropathy: Secondary | ICD-10-CM | POA: Diagnosis not present

## 2017-11-10 LAB — BASIC METABOLIC PANEL
BUN: 13 mg/dL (ref 6–23)
CO2: 29 mEq/L (ref 19–32)
Calcium: 9.9 mg/dL (ref 8.4–10.5)
Chloride: 102 mEq/L (ref 96–112)
Creatinine, Ser: 0.96 mg/dL (ref 0.40–1.20)
GFR: 61.38 mL/min (ref >60.00)
Glucose, Bld: 115 mg/dL — ABNORMAL HIGH (ref 70–99)
Potassium: 4.2 mEq/L (ref 3.5–5.1)
Sodium: 135 mEq/L (ref 135–145)

## 2017-11-14 ENCOUNTER — Other Ambulatory Visit: Payer: Self-pay | Admitting: Family

## 2017-11-14 DIAGNOSIS — I10 Essential (primary) hypertension: Secondary | ICD-10-CM

## 2017-11-19 ENCOUNTER — Other Ambulatory Visit: Payer: Self-pay | Admitting: Family

## 2017-11-19 DIAGNOSIS — F411 Generalized anxiety disorder: Secondary | ICD-10-CM

## 2017-11-30 ENCOUNTER — Other Ambulatory Visit: Payer: Self-pay | Admitting: Internal Medicine

## 2018-01-05 ENCOUNTER — Other Ambulatory Visit: Payer: Self-pay | Admitting: Family

## 2018-01-05 DIAGNOSIS — Z1231 Encounter for screening mammogram for malignant neoplasm of breast: Secondary | ICD-10-CM

## 2018-01-22 ENCOUNTER — Ambulatory Visit
Admission: RE | Admit: 2018-01-22 | Discharge: 2018-01-22 | Disposition: A | Discharge status: Home / Self Care | Payer: Medicare Other | Source: Ambulatory Visit | Attending: Family | Admitting: Family

## 2018-01-22 DIAGNOSIS — Z1231 Encounter for screening mammogram for malignant neoplasm of breast: Secondary | ICD-10-CM

## 2018-02-09 ENCOUNTER — Encounter: Payer: Medicare Other | Admitting: Family

## 2018-02-12 ENCOUNTER — Encounter: Payer: Medicare Other | Admitting: Family

## 2018-02-17 ENCOUNTER — Other Ambulatory Visit: Payer: Self-pay | Admitting: Family

## 2018-02-17 DIAGNOSIS — I10 Essential (primary) hypertension: Secondary | ICD-10-CM

## 2018-03-16 ENCOUNTER — Other Ambulatory Visit: Payer: Self-pay | Admitting: Internal Medicine

## 2018-03-16 DIAGNOSIS — E559 Vitamin D deficiency, unspecified: Secondary | ICD-10-CM | POA: Diagnosis not present

## 2018-03-16 DIAGNOSIS — G43909 Migraine, unspecified, not intractable, without status migrainosus: Secondary | ICD-10-CM | POA: Diagnosis not present

## 2018-03-16 DIAGNOSIS — R7309 Other abnormal glucose: Secondary | ICD-10-CM | POA: Diagnosis not present

## 2018-03-16 DIAGNOSIS — F411 Generalized anxiety disorder: Secondary | ICD-10-CM | POA: Diagnosis not present

## 2018-03-16 DIAGNOSIS — J309 Allergic rhinitis, unspecified: Secondary | ICD-10-CM | POA: Diagnosis not present

## 2018-03-16 DIAGNOSIS — Z0001 Encounter for general adult medical examination with abnormal findings: Secondary | ICD-10-CM | POA: Diagnosis not present

## 2018-03-16 DIAGNOSIS — M5432 Sciatica, left side: Secondary | ICD-10-CM | POA: Diagnosis not present

## 2018-03-16 DIAGNOSIS — G64 Other disorders of peripheral nervous system: Secondary | ICD-10-CM | POA: Diagnosis not present

## 2018-03-16 DIAGNOSIS — I10 Essential (primary) hypertension: Secondary | ICD-10-CM | POA: Diagnosis not present

## 2018-03-16 DIAGNOSIS — G629 Polyneuropathy, unspecified: Secondary | ICD-10-CM | POA: Diagnosis not present

## 2018-03-16 DIAGNOSIS — R5383 Other fatigue: Secondary | ICD-10-CM | POA: Diagnosis not present

## 2018-03-16 DIAGNOSIS — E782 Mixed hyperlipidemia: Secondary | ICD-10-CM | POA: Diagnosis not present

## 2018-03-16 DIAGNOSIS — K219 Gastro-esophageal reflux disease without esophagitis: Secondary | ICD-10-CM | POA: Diagnosis not present

## 2018-03-16 DIAGNOSIS — E538 Deficiency of other specified B group vitamins: Secondary | ICD-10-CM | POA: Diagnosis not present

## 2018-04-02 ENCOUNTER — Other Ambulatory Visit: Payer: Medicare Other

## 2018-05-01 ENCOUNTER — Other Ambulatory Visit: Payer: Self-pay | Admitting: Internal Medicine

## 2018-05-06 DIAGNOSIS — E669 Obesity, unspecified: Secondary | ICD-10-CM | POA: Diagnosis not present

## 2018-05-06 DIAGNOSIS — M170 Bilateral primary osteoarthritis of knee: Secondary | ICD-10-CM | POA: Diagnosis not present

## 2018-05-06 DIAGNOSIS — G629 Polyneuropathy, unspecified: Secondary | ICD-10-CM | POA: Diagnosis not present

## 2018-05-20 ENCOUNTER — Other Ambulatory Visit: Payer: Self-pay | Admitting: Family

## 2018-05-20 DIAGNOSIS — F411 Generalized anxiety disorder: Secondary | ICD-10-CM

## 2018-05-21 ENCOUNTER — Other Ambulatory Visit: Payer: Self-pay | Admitting: Family

## 2018-05-21 DIAGNOSIS — I10 Essential (primary) hypertension: Secondary | ICD-10-CM

## 2018-07-23 ENCOUNTER — Other Ambulatory Visit: Payer: Self-pay

## 2018-08-15 ENCOUNTER — Other Ambulatory Visit: Payer: Self-pay | Admitting: Family

## 2018-08-15 DIAGNOSIS — F411 Generalized anxiety disorder: Secondary | ICD-10-CM

## 2018-08-19 DIAGNOSIS — H2513 Age-related nuclear cataract, bilateral: Secondary | ICD-10-CM | POA: Diagnosis not present

## 2018-08-26 DIAGNOSIS — F3289 Other specified depressive episodes: Secondary | ICD-10-CM | POA: Diagnosis not present

## 2018-08-26 DIAGNOSIS — M5432 Sciatica, left side: Secondary | ICD-10-CM | POA: Diagnosis not present

## 2018-08-26 DIAGNOSIS — E669 Obesity, unspecified: Secondary | ICD-10-CM | POA: Diagnosis not present

## 2018-08-26 DIAGNOSIS — I10 Essential (primary) hypertension: Secondary | ICD-10-CM | POA: Diagnosis not present

## 2018-08-26 DIAGNOSIS — J069 Acute upper respiratory infection, unspecified: Secondary | ICD-10-CM | POA: Diagnosis not present

## 2018-08-26 DIAGNOSIS — G4700 Insomnia, unspecified: Secondary | ICD-10-CM | POA: Diagnosis not present

## 2018-10-20 DIAGNOSIS — H2512 Age-related nuclear cataract, left eye: Secondary | ICD-10-CM | POA: Diagnosis not present

## 2018-10-22 DIAGNOSIS — M1711 Unilateral primary osteoarthritis, right knee: Secondary | ICD-10-CM | POA: Diagnosis not present

## 2018-10-22 DIAGNOSIS — M1712 Unilateral primary osteoarthritis, left knee: Secondary | ICD-10-CM | POA: Diagnosis not present

## 2018-10-25 ENCOUNTER — Other Ambulatory Visit: Payer: Self-pay

## 2018-10-25 ENCOUNTER — Encounter: Payer: Self-pay | Admitting: *Deleted

## 2018-10-28 NOTE — Discharge Instructions (Signed)

## 2018-10-29 DIAGNOSIS — M1712 Unilateral primary osteoarthritis, left knee: Secondary | ICD-10-CM | POA: Diagnosis not present

## 2018-10-29 DIAGNOSIS — M1711 Unilateral primary osteoarthritis, right knee: Secondary | ICD-10-CM | POA: Diagnosis not present

## 2018-11-01 ENCOUNTER — Ambulatory Visit: Payer: Medicare Other | Admitting: Anesthesiology

## 2018-11-01 ENCOUNTER — Ambulatory Visit
Admission: RE | Admit: 2018-11-01 | Discharge: 2018-11-01 | Disposition: A | Discharge status: Home / Self Care | Payer: Medicare Other | Source: Ambulatory Visit | Attending: Ophthalmology | Admitting: Ophthalmology

## 2018-11-01 ENCOUNTER — Encounter
Admission: RE | Disposition: A | Discharge status: Home / Self Care | Payer: Self-pay | Source: Ambulatory Visit | Attending: Ophthalmology

## 2018-11-01 DIAGNOSIS — E1136 Type 2 diabetes mellitus with diabetic cataract: Secondary | ICD-10-CM | POA: Diagnosis not present

## 2018-11-01 DIAGNOSIS — H2512 Age-related nuclear cataract, left eye: Secondary | ICD-10-CM | POA: Insufficient documentation

## 2018-11-01 DIAGNOSIS — F419 Anxiety disorder, unspecified: Secondary | ICD-10-CM | POA: Insufficient documentation

## 2018-11-01 DIAGNOSIS — K219 Gastro-esophageal reflux disease without esophagitis: Secondary | ICD-10-CM | POA: Insufficient documentation

## 2018-11-01 DIAGNOSIS — Z87891 Personal history of nicotine dependence: Secondary | ICD-10-CM | POA: Diagnosis not present

## 2018-11-01 DIAGNOSIS — F329 Major depressive disorder, single episode, unspecified: Secondary | ICD-10-CM | POA: Insufficient documentation

## 2018-11-01 DIAGNOSIS — Z79899 Other long term (current) drug therapy: Secondary | ICD-10-CM | POA: Diagnosis not present

## 2018-11-01 DIAGNOSIS — H25812 Combined forms of age-related cataract, left eye: Secondary | ICD-10-CM | POA: Diagnosis not present

## 2018-11-01 DIAGNOSIS — Z791 Long term (current) use of non-steroidal anti-inflammatories (NSAID): Secondary | ICD-10-CM | POA: Diagnosis not present

## 2018-11-01 DIAGNOSIS — I10 Essential (primary) hypertension: Secondary | ICD-10-CM | POA: Insufficient documentation

## 2018-11-01 DIAGNOSIS — G4730 Sleep apnea, unspecified: Secondary | ICD-10-CM | POA: Diagnosis not present

## 2018-11-01 HISTORY — DX: Dizziness and giddiness: R42

## 2018-11-01 HISTORY — PX: CATARACT EXTRACTION W/PHACO: SHX586

## 2018-11-01 SURGERY — PHACOEMULSIFICATION, CATARACT, WITH IOL INSERTION
Anesthesia: Monitor Anesthesia Care | Laterality: Left

## 2018-11-01 MED ORDER — EPINEPHRINE PF 1 MG/ML IJ SOLN
INTRAOCULAR | Status: DC | PRN
  Administered 2018-11-01: 133 mL via OPHTHALMIC

## 2018-11-01 MED ORDER — TETRACAINE HCL 0.5 % OP SOLN
1.0000 [drp] | OPHTHALMIC | Status: DC | PRN
  Administered 2018-11-01 (×3): 1 [drp] via OPHTHALMIC

## 2018-11-01 MED ORDER — MIDAZOLAM HCL 2 MG/2ML IJ SOLN
INTRAMUSCULAR | Status: DC | PRN
  Administered 2018-11-01: 2 mg via INTRAVENOUS

## 2018-11-01 MED ORDER — SODIUM HYALURONATE 10 MG/ML IO SOLN
INTRAOCULAR | Status: DC | PRN
  Administered 2018-11-01: 0.55 mL via INTRAOCULAR

## 2018-11-01 MED ORDER — ARMC OPHTHALMIC DILATING DROPS
1.0000 "application " | OPHTHALMIC | Status: DC | PRN
  Administered 2018-11-01 (×3): 1 via OPHTHALMIC

## 2018-11-01 MED ORDER — MOXIFLOXACIN HCL 0.5 % OP SOLN
OPHTHALMIC | Status: DC | PRN
  Administered 2018-11-01: 0.2 mL via OPHTHALMIC

## 2018-11-01 MED ORDER — FENTANYL CITRATE (PF) 100 MCG/2ML IJ SOLN
INTRAMUSCULAR | Status: DC | PRN
  Administered 2018-11-01: 50 ug via INTRAVENOUS

## 2018-11-01 MED ORDER — SODIUM HYALURONATE 23 MG/ML IO SOLN
INTRAOCULAR | Status: DC | PRN
  Administered 2018-11-01: 0.6 mL via INTRAOCULAR

## 2018-11-01 MED ORDER — LIDOCAINE HCL (PF) 2 % IJ SOLN
INTRAOCULAR | Status: DC | PRN
  Administered 2018-11-01: 1 mL via INTRAOCULAR

## 2018-11-01 SURGICAL SUPPLY — 17 items
CANNULA ANT/CHMB 27GA (MISCELLANEOUS) ×3 IMPLANT
DISSECTOR HYDRO NUCLEUS 50X22 (MISCELLANEOUS) ×3 IMPLANT
GLOVE SURG LX 7.5 STRW (GLOVE) ×2
GLOVE SURG LX STRL 7.5 STRW (GLOVE) ×1 IMPLANT
GLOVE SURG SYN 8.5  E (GLOVE) ×2
GLOVE SURG SYN 8.5 E (GLOVE) ×1 IMPLANT
GOWN STRL REUS W/ TWL LRG LVL3 (GOWN DISPOSABLE) ×2 IMPLANT
GOWN STRL REUS W/TWL LRG LVL3 (GOWN DISPOSABLE) ×4
LENS IOL TECNIS ITEC 22.5 (Intraocular Lens) ×3 IMPLANT
MARKER SKIN DUAL TIP RULER LAB (MISCELLANEOUS) ×3 IMPLANT
PACK DR. KING ARMS (PACKS) ×3 IMPLANT
PACK EYE AFTER SURG (MISCELLANEOUS) ×3 IMPLANT
PACK OPTHALMIC (MISCELLANEOUS) ×3 IMPLANT
SYR 3ML LL SCALE MARK (SYRINGE) ×3 IMPLANT
SYR TB 1ML LUER SLIP (SYRINGE) ×3 IMPLANT
WATER STERILE IRR 500ML POUR (IV SOLUTION) ×3 IMPLANT
WIPE NON LINTING 3.25X3.25 (MISCELLANEOUS) ×3 IMPLANT

## 2018-11-01 NOTE — Anesthesia Preprocedure Evaluation (Signed)
Anesthesia Evaluation  Patient identified by MRN, date of birth, ID band Patient awake    Reviewed: Allergy & Precautions, H&P , NPO status , Patient's Chart, lab work & pertinent test results, reviewed documented beta blocker date and time   History of Anesthesia Complications (+) PONV and history of anesthetic complications  Airway Mallampati: II  TM Distance: >3 FB Neck ROM: full    Dental no notable dental hx.    Pulmonary sleep apnea , former smoker,    Pulmonary exam normal breath sounds clear to auscultation       Cardiovascular Exercise Tolerance: Good hypertension, negative cardio ROS   Rhythm:regular Rate:Normal     Neuro/Psych  Headaches, PSYCHIATRIC DISORDERS Anxiety Depression    GI/Hepatic Neg liver ROS, hiatal hernia, GERD  ,  Endo/Other  diabetes, Type 2  Renal/GU negative Renal ROS  negative genitourinary   Musculoskeletal   Abdominal   Peds  Hematology negative hematology ROS (+)   Anesthesia Other Findings   Reproductive/Obstetrics negative OB ROS                            Anesthesia Physical Anesthesia Plan  ASA: III  Anesthesia Plan: MAC   Post-op Pain Management:    Induction:   PONV Risk Score and Plan:   Airway Management Planned:   Additional Equipment:   Intra-op Plan:   Post-operative Plan:   Informed Consent: I have reviewed the patients History and Physical, chart, labs and discussed the procedure including the risks, benefits and alternatives for the proposed anesthesia with the patient or authorized representative who has indicated his/her understanding and acceptance.   Dental Advisory Given  Plan Discussed with: CRNA  Anesthesia Plan Comments:         Anesthesia Quick Evaluation

## 2018-11-01 NOTE — Anesthesia Postprocedure Evaluation (Signed)
Anesthesia Post Note  Patient: Julie Rush  Procedure(s) Performed: CATARACT EXTRACTION PHACO AND INTRAOCULAR LENS PLACEMENT (IOC) LEFT (Left )  Patient location during evaluation: PACU Anesthesia Type: MAC Level of consciousness: awake and alert Pain management: pain level controlled Vital Signs Assessment: post-procedure vital signs reviewed and stable Respiratory status: spontaneous breathing, nonlabored ventilation, respiratory function stable and patient connected to nasal cannula oxygen Cardiovascular status: stable and blood pressure returned to baseline Postop Assessment: no apparent nausea or vomiting Anesthetic complications: no    Alisa Graff

## 2018-11-01 NOTE — Anesthesia Procedure Notes (Signed)
Procedure Name: MAC Date/Time: 11/01/2018 1:26 PM Performed by: Janna Arch, CRNA Pre-anesthesia Checklist: Patient identified, Emergency Drugs available, Suction available, Timeout performed and Patient being monitored Patient Re-evaluated:Patient Re-evaluated prior to induction Oxygen Delivery Method: Nasal cannula Placement Confirmation: positive ETCO2

## 2018-11-01 NOTE — Op Note (Signed)
OPERATIVE NOTE  GYNETH HUBKA [id number] 11/01/2018   PREOPERATIVE DIAGNOSIS:  Nuclear sclerotic cataract left eye.  H25.12   POSTOPERATIVE DIAGNOSIS:    Nuclear sclerotic cataract left eye.     PROCEDURE:  Phacoemusification with posterior chamber intraocular lens placement of the left eye   LENS:   Implant Name Type Inv. Item Serial No. Manufacturer Lot No. LRB No. Used  LENS IOL DIOP 22.5 - [id number] Intraocular Lens LENS IOL DIOP 22.5 [id number] AMO  Left 1       PCB00 +22.5   ULTRASOUND TIME: 2 minutes 03 seconds.  CDE 24.24   SURGEON:  Benay Pillow, MD, MPH   ANESTHESIA:  Topical with tetracaine drops augmented with 1% preservative-free intracameral lidocaine.  ESTIMATED BLOOD LOSS: <1 mL   COMPLICATIONS:  None.   DESCRIPTION OF PROCEDURE:  The patient was identified in the holding room and transported to the operating room and placed in the supine position under the operating microscope.  The left eye was identified as the operative eye and it was prepped and draped in the usual sterile ophthalmic fashion.   A 1.0 millimeter clear-corneal paracentesis was made at the 5:00 position. 0.5 ml of preservative-free 1% lidocaine with epinephrine was injected into the anterior chamber.  The anterior chamber was filled with Healon 5 viscoelastic.  A 2.4 millimeter keratome was used to make a near-clear corneal incision at the 2:00 position.  A curvilinear capsulorrhexis was made with a cystotome and capsulorrhexis forceps.  Balanced salt solution was used to hydrodissect and hydrodelineate the nucleus.   Phacoemulsification was then used in stop and chop fashion to remove the lens nucleus and epinucleus.  The remaining cortex was then removed using the irrigation and aspiration handpiece. Healon was then placed into the capsular bag to distend it for lens placement.  A lens was then injected into the capsular bag.  The remaining viscoelastic was aspirated.   Wounds were  hydrated with balanced salt solution.  The anterior chamber was inflated to a physiologic pressure with balanced salt solution.  Intracameral vigamox 0.1 mL undiltued was injected into the eye and a drop placed onto the ocular surface.  No wound leaks were noted.  The patient was taken to the recovery room in stable condition without complications of anesthesia or surgery  Benay Pillow 11/01/2018, 1:50 PM

## 2018-11-01 NOTE — H&P (Signed)
The History and Physical notes are on paper, have been signed, and are to be scanned.   I have examined the patient and there are no changes to the H&P.   Benay Pillow 11/01/2018 1:18 PM

## 2018-11-01 NOTE — Transfer of Care (Signed)
Immediate Anesthesia Transfer of Care Note  Patient: Julie Rush  Procedure(s) Performed: CATARACT EXTRACTION PHACO AND INTRAOCULAR LENS PLACEMENT (IOC) LEFT (Left )  Patient Location: PACU  Anesthesia Type: MAC  Level of Consciousness: awake, alert  and patient cooperative  Airway and Oxygen Therapy: Patient Spontanous Breathing and Patient connected to supplemental oxygen  Post-op Assessment: Post-op Vital signs reviewed, Patient's Cardiovascular Status Stable, Respiratory Function Stable, Patent Airway and No signs of Nausea or vomiting  Post-op Vital Signs: Reviewed and stable  Complications: No apparent anesthesia complications

## 2018-11-02 ENCOUNTER — Encounter: Payer: Self-pay | Admitting: Ophthalmology

## 2018-11-05 DIAGNOSIS — M1712 Unilateral primary osteoarthritis, left knee: Secondary | ICD-10-CM | POA: Diagnosis not present

## 2018-11-05 DIAGNOSIS — M1711 Unilateral primary osteoarthritis, right knee: Secondary | ICD-10-CM | POA: Diagnosis not present

## 2018-11-12 DIAGNOSIS — H2511 Age-related nuclear cataract, right eye: Secondary | ICD-10-CM | POA: Diagnosis not present

## 2018-11-17 ENCOUNTER — Encounter: Payer: Self-pay | Admitting: *Deleted

## 2018-11-17 ENCOUNTER — Other Ambulatory Visit: Payer: Self-pay

## 2018-11-22 NOTE — Anesthesia Preprocedure Evaluation (Addendum)
Anesthesia Evaluation  Patient identified by MRN, date of birth, ID band Patient awake    Reviewed: Allergy & Precautions, NPO status , Patient's Chart, lab work & pertinent test results  History of Anesthesia Complications (+) PONV and history of anesthetic complications  Airway Mallampati: II   Neck ROM: Full    Dental  (+)    Pulmonary former smoker (quit 1976),    Pulmonary exam normal breath sounds clear to auscultation       Cardiovascular Exercise Tolerance: Good hypertension, Normal cardiovascular exam Rhythm:Regular Rate:Normal     Neuro/Psych  Headaches, PSYCHIATRIC DISORDERS Anxiety Depression Vertigo     GI/Hepatic hiatal hernia, GERD  ,  Endo/Other  diabetes, Type 2  Renal/GU      Musculoskeletal   Abdominal   Peds  Hematology negative hematology ROS (+)   Anesthesia Other Findings   Reproductive/Obstetrics                            Anesthesia Physical Anesthesia Plan  ASA: III  Anesthesia Plan: MAC   Post-op Pain Management:    Induction: Intravenous  PONV Risk Score and Plan: 3 and TIVA, Midazolam and Treatment may vary due to age or medical condition  Airway Management Planned: Natural Airway  Additional Equipment:   Intra-op Plan:   Post-operative Plan:   Informed Consent: I have reviewed the patients History and Physical, chart, labs and discussed the procedure including the risks, benefits and alternatives for the proposed anesthesia with the patient or authorized representative who has indicated his/her understanding and acceptance.     Plan Discussed with: CRNA  Anesthesia Plan Comments:        Anesthesia Quick Evaluation

## 2018-11-26 NOTE — Discharge Instructions (Signed)

## 2018-11-29 ENCOUNTER — Ambulatory Visit: Payer: Medicare Other | Admitting: Anesthesiology

## 2018-11-29 ENCOUNTER — Ambulatory Visit
Admission: RE | Admit: 2018-11-29 | Discharge: 2018-11-29 | Disposition: A | Discharge status: Home / Self Care | Payer: Medicare Other | Source: Ambulatory Visit | Attending: Ophthalmology | Admitting: Ophthalmology

## 2018-11-29 ENCOUNTER — Encounter
Admission: RE | Disposition: A | Discharge status: Home / Self Care | Payer: Self-pay | Source: Ambulatory Visit | Attending: Ophthalmology

## 2018-11-29 DIAGNOSIS — Z79899 Other long term (current) drug therapy: Secondary | ICD-10-CM | POA: Diagnosis not present

## 2018-11-29 DIAGNOSIS — E119 Type 2 diabetes mellitus without complications: Secondary | ICD-10-CM | POA: Insufficient documentation

## 2018-11-29 DIAGNOSIS — I10 Essential (primary) hypertension: Secondary | ICD-10-CM | POA: Insufficient documentation

## 2018-11-29 DIAGNOSIS — H2511 Age-related nuclear cataract, right eye: Secondary | ICD-10-CM | POA: Diagnosis not present

## 2018-11-29 DIAGNOSIS — Z7982 Long term (current) use of aspirin: Secondary | ICD-10-CM | POA: Diagnosis not present

## 2018-11-29 DIAGNOSIS — Z9842 Cataract extraction status, left eye: Secondary | ICD-10-CM | POA: Diagnosis not present

## 2018-11-29 DIAGNOSIS — Z87891 Personal history of nicotine dependence: Secondary | ICD-10-CM | POA: Diagnosis not present

## 2018-11-29 DIAGNOSIS — H25811 Combined forms of age-related cataract, right eye: Secondary | ICD-10-CM | POA: Diagnosis not present

## 2018-11-29 HISTORY — PX: CATARACT EXTRACTION W/PHACO: SHX586

## 2018-11-29 SURGERY — PHACOEMULSIFICATION, CATARACT, WITH IOL INSERTION
Anesthesia: Monitor Anesthesia Care | Site: Eye | Laterality: Right

## 2018-11-29 MED ORDER — LIDOCAINE HCL (PF) 2 % IJ SOLN
INTRAOCULAR | Status: DC | PRN
  Administered 2018-11-29: 2 mL via INTRAOCULAR

## 2018-11-29 MED ORDER — TETRACAINE HCL 0.5 % OP SOLN
1.0000 [drp] | OPHTHALMIC | Status: DC | PRN
  Administered 2018-11-29 (×2): 1 [drp] via OPHTHALMIC

## 2018-11-29 MED ORDER — FENTANYL CITRATE (PF) 100 MCG/2ML IJ SOLN
INTRAMUSCULAR | Status: DC | PRN
  Administered 2018-11-29: 50 ug via INTRAVENOUS

## 2018-11-29 MED ORDER — EPINEPHRINE PF 1 MG/ML IJ SOLN
INTRAOCULAR | Status: DC | PRN
  Administered 2018-11-29: 134 mL via OPHTHALMIC

## 2018-11-29 MED ORDER — SODIUM HYALURONATE 23 MG/ML IO SOLN
INTRAOCULAR | Status: DC | PRN
  Administered 2018-11-29: 0.6 mL via INTRAOCULAR

## 2018-11-29 MED ORDER — MIDAZOLAM HCL 2 MG/2ML IJ SOLN
INTRAMUSCULAR | Status: DC | PRN
  Administered 2018-11-29: 2 mg via INTRAVENOUS

## 2018-11-29 MED ORDER — MOXIFLOXACIN HCL 0.5 % OP SOLN
OPHTHALMIC | Status: DC | PRN
  Administered 2018-11-29: 0.2 mL via OPHTHALMIC

## 2018-11-29 MED ORDER — LACTATED RINGERS IV SOLN: INTRAVENOUS | Status: DC

## 2018-11-29 MED ORDER — SODIUM HYALURONATE 10 MG/ML IO SOLN
INTRAOCULAR | Status: DC | PRN
  Administered 2018-11-29: 0.55 mL via INTRAOCULAR

## 2018-11-29 MED ORDER — ARMC OPHTHALMIC DILATING DROPS
1.0000 "application " | OPHTHALMIC | Status: DC | PRN
  Administered 2018-11-29 (×3): 1 via OPHTHALMIC

## 2018-11-29 SURGICAL SUPPLY — 19 items
CANNULA ANT/CHMB 27G (MISCELLANEOUS) ×2 IMPLANT
CANNULA ANT/CHMB 27GA (MISCELLANEOUS) ×6 IMPLANT
DISSECTOR HYDRO NUCLEUS 50X22 (MISCELLANEOUS) ×3 IMPLANT
GLOVE SURG LX 7.5 STRW (GLOVE) ×2
GLOVE SURG LX STRL 7.5 STRW (GLOVE) ×1 IMPLANT
GLOVE SURG SYN 8.5  E (GLOVE) ×2
GLOVE SURG SYN 8.5 E (GLOVE) ×1 IMPLANT
GLOVE SURG SYN 8.5 PF PI (GLOVE) ×1 IMPLANT
GOWN STRL REUS W/ TWL LRG LVL3 (GOWN DISPOSABLE) ×2 IMPLANT
GOWN STRL REUS W/TWL LRG LVL3 (GOWN DISPOSABLE) ×4
LENS IOL TECNIS ITEC 22.5 (Intraocular Lens) ×2 IMPLANT
MARKER SKIN DUAL TIP RULER LAB (MISCELLANEOUS) ×3 IMPLANT
PACK DR. KING ARMS (PACKS) ×3 IMPLANT
PACK EYE AFTER SURG (MISCELLANEOUS) ×3 IMPLANT
PACK OPTHALMIC (MISCELLANEOUS) ×3 IMPLANT
SYR 3ML LL SCALE MARK (SYRINGE) ×3 IMPLANT
SYR TB 1ML LUER SLIP (SYRINGE) ×3 IMPLANT
WATER STERILE IRR 500ML POUR (IV SOLUTION) ×3 IMPLANT
WIPE NON LINTING 3.25X3.25 (MISCELLANEOUS) ×3 IMPLANT

## 2018-11-29 NOTE — Transfer of Care (Signed)
Immediate Anesthesia Transfer of Care Note  Patient: Julie Rush  Procedure(s) Performed: CATARACT EXTRACTION PHACO AND INTRAOCULAR LENS PLACEMENT (IOC) RIGHT (Right Eye)  Patient Location: PACU  Anesthesia Type: MAC  Level of Consciousness: awake, alert  and patient cooperative  Airway and Oxygen Therapy: Patient Spontanous Breathing and Patient connected to supplemental oxygen  Post-op Assessment: Post-op Vital signs reviewed, Patient's Cardiovascular Status Stable, Respiratory Function Stable, Patent Airway and No signs of Nausea or vomiting  Post-op Vital Signs: Reviewed and stable  Complications: No apparent anesthesia complications

## 2018-11-29 NOTE — H&P (Signed)

## 2018-11-29 NOTE — Op Note (Signed)
OPERATIVE NOTE  Julie Rush [id number] 11/29/2018   PREOPERATIVE DIAGNOSIS:  Nuclear sclerotic cataract right eye.  H25.11   POSTOPERATIVE DIAGNOSIS:    Nuclear sclerotic cataract right eye.     PROCEDURE:  Phacoemusification with posterior chamber intraocular lens placement of the right eye   LENS:   Implant Name Type Inv. Item Serial No. Manufacturer Lot No. LRB No. Used  LENS IOL DIOP 22.5 - [id number] Intraocular Lens LENS IOL DIOP 22.5 [id number] AMO  Right 1       PCB00 +22.5   ULTRASOUND TIME: 2 minutes 28 seconds.  CDE 18.44   SURGEON:  Benay Pillow, MD, MPH  ANESTHESIOLOGIST: Anesthesiologist: Darrin Nipper, MD CRNA: Cameron Ali, CRNA   ANESTHESIA:  Topical with tetracaine drops augmented with 1% preservative-free intracameral lidocaine.  ESTIMATED BLOOD LOSS: less than 1 mL.   COMPLICATIONS:  None.   DESCRIPTION OF PROCEDURE:  The patient was identified in the holding room and transported to the operating room and placed in the supine position under the operating microscope.  The right eye was identified as the operative eye and it was prepped and draped in the usual sterile ophthalmic fashion.   A 1.0 millimeter clear-corneal paracentesis was made at the 10:30 position. 0.5 ml of preservative-free 1% lidocaine with epinephrine was injected into the anterior chamber.  The anterior chamber was filled with Healon 5 viscoelastic.  A 2.4 millimeter keratome was used to make a near-clear corneal incision at the 8:00 position.  A curvilinear capsulorrhexis was made with a cystotome and capsulorrhexis forceps.  Balanced salt solution was used to hydrodissect and hydrodelineate the nucleus.   Phacoemulsification was then used in stop and chop fashion to remove the lens nucleus and epinucleus.  The remaining cortex was then removed using the irrigation and aspiration handpiece. Healon was then placed into the capsular bag to distend it for lens placement.  A lens was  then injected into the capsular bag.  The remaining viscoelastic was aspirated.   Wounds were hydrated with balanced salt solution.  The anterior chamber was inflated to a physiologic pressure with balanced salt solution.   Intracameral vigamox 0.1 mL undiluted was injected into the eye and a drop placed onto the ocular surface.  No wound leaks were noted.  The patient was taken to the recovery room in stable condition without complications of anesthesia or surgery  Benay Pillow 11/29/2018, 10:24 AM

## 2018-11-29 NOTE — Anesthesia Procedure Notes (Signed)
Procedure Name: MAC Date/Time: 11/29/2018 9:56 AM Performed by: Cameron Ali, CRNA Pre-anesthesia Checklist: Patient identified, Emergency Drugs available, Suction available, Timeout performed and Patient being monitored Patient Re-evaluated:Patient Re-evaluated prior to induction Oxygen Delivery Method: Nasal cannula Placement Confirmation: positive ETCO2

## 2018-11-29 NOTE — Anesthesia Postprocedure Evaluation (Signed)
Anesthesia Post Note  Patient: Julie Rush  Procedure(s) Performed: CATARACT EXTRACTION PHACO AND INTRAOCULAR LENS PLACEMENT (IOC) RIGHT (Right Eye)  Patient location during evaluation: PACU Anesthesia Type: MAC Level of consciousness: awake and alert, oriented and patient cooperative Pain management: pain level controlled Vital Signs Assessment: post-procedure vital signs reviewed and stable Respiratory status: spontaneous breathing, nonlabored ventilation and respiratory function stable Cardiovascular status: blood pressure returned to baseline and stable Postop Assessment: adequate PO intake Anesthetic complications: no    Darrin Nipper

## 2018-11-30 ENCOUNTER — Encounter: Payer: Self-pay | Admitting: Ophthalmology

## 2019-01-04 ENCOUNTER — Other Ambulatory Visit: Payer: Self-pay | Admitting: Family

## 2019-01-04 DIAGNOSIS — Z1231 Encounter for screening mammogram for malignant neoplasm of breast: Secondary | ICD-10-CM

## 2019-01-21 ENCOUNTER — Encounter: Payer: Self-pay | Admitting: Family

## 2019-01-21 ENCOUNTER — Ambulatory Visit (INDEPENDENT_AMBULATORY_CARE_PROVIDER_SITE_OTHER): Payer: Medicare Other | Admitting: Family

## 2019-01-21 VITALS — BP 134/78 | HR 66 | Temp 97.7°F | Ht 65.25 in | Wt 259.8 lb

## 2019-01-21 DIAGNOSIS — I10 Essential (primary) hypertension: Secondary | ICD-10-CM | POA: Diagnosis not present

## 2019-01-21 DIAGNOSIS — E1121 Type 2 diabetes mellitus with diabetic nephropathy: Secondary | ICD-10-CM

## 2019-01-21 DIAGNOSIS — Z23 Encounter for immunization: Secondary | ICD-10-CM

## 2019-01-21 DIAGNOSIS — F411 Generalized anxiety disorder: Secondary | ICD-10-CM

## 2019-01-21 DIAGNOSIS — Z13820 Encounter for screening for osteoporosis: Secondary | ICD-10-CM | POA: Diagnosis not present

## 2019-01-21 DIAGNOSIS — Z0000 Encounter for general adult medical examination without abnormal findings: Secondary | ICD-10-CM

## 2019-01-21 LAB — COMPREHENSIVE METABOLIC PANEL
ALT: 13 U/L (ref 0–35)
AST: 16 U/L (ref 0–37)
Albumin: 3.8 g/dL (ref 3.5–5.2)
Alkaline Phosphatase: 50 U/L (ref 39–117)
BUN: 21 mg/dL (ref 6–23)
CO2: 28 mEq/L (ref 19–32)
Calcium: 9.5 mg/dL (ref 8.4–10.5)
Chloride: 103 mEq/L (ref 96–112)
Creatinine, Ser: 1.04 mg/dL (ref 0.40–1.20)
GFR: 52.47 mL/min — ABNORMAL LOW (ref >60.00)
Glucose, Bld: 109 mg/dL — ABNORMAL HIGH (ref 70–99)
Potassium: 4.1 mEq/L (ref 3.5–5.1)
Sodium: 137 mEq/L (ref 135–145)
Total Bilirubin: 0.5 mg/dL (ref 0.2–1.2)
Total Protein: 6.6 g/dL (ref 6.0–8.3)

## 2019-01-21 LAB — CBC WITH DIFFERENTIAL/PLATELET
Basophils Absolute: 0.1 10*3/uL (ref 0.0–0.1)
Basophils Relative: 1.1 % (ref 0.0–3.0)
Eosinophils Absolute: 0.2 10*3/uL (ref 0.0–0.7)
Eosinophils Relative: 2.3 % (ref 0.0–5.0)
HCT: 37.1 % (ref 36.0–46.0)
Hemoglobin: 12.1 g/dL (ref 12.0–15.0)
Lymphocytes Relative: 25.3 % (ref 12.0–46.0)
Lymphs Abs: 1.9 10*3/uL (ref 0.7–4.0)
MCHC: 32.6 g/dL (ref 30.0–36.0)
MCV: 94.3 fl (ref 78.0–100.0)
Monocytes Absolute: 0.5 10*3/uL (ref 0.1–1.0)
Monocytes Relative: 6.4 % (ref 3.0–12.0)
Neutro Abs: 5 10*3/uL (ref 1.4–7.7)
Neutrophils Relative %: 64.9 % (ref 43.0–77.0)
Platelets: 266 10*3/uL (ref 150.0–400.0)
RBC: 3.94 Mil/uL (ref 3.87–5.11)
RDW: 13.1 % (ref 11.5–15.5)
WBC: 7.7 10*3/uL (ref 4.0–10.5)

## 2019-01-21 LAB — LIPID PANEL
Cholesterol: 192 mg/dL (ref 0–200)
HDL: 51.2 mg/dL (ref >39.00)
LDL Cholesterol: 121 mg/dL — ABNORMAL HIGH (ref 0–99)
NonHDL: 140.37
Total CHOL/HDL Ratio: 4
Triglycerides: 97 mg/dL (ref 0.0–149.0)
VLDL: 19.4 mg/dL (ref 0.0–40.0)

## 2019-01-21 LAB — HEMOGLOBIN A1C: Hgb A1c MFr Bld: 5.8 % (ref 4.6–6.5)

## 2019-01-21 LAB — MICROALBUMIN / CREATININE URINE RATIO
Creatinine,U: 155.6 mg/dL
Microalb Creat Ratio: 1.2 mg/g (ref 0.0–30.0)
Microalb, Ur: 1.9 mg/dL (ref 0.0–1.9)

## 2019-01-21 LAB — VITAMIN D 25 HYDROXY (VIT D DEFICIENCY, FRACTURES): VITD: 22.62 ng/mL — ABNORMAL LOW (ref 30.00–100.00)

## 2019-01-21 LAB — TSH: TSH: 2.32 u[IU]/mL (ref 0.35–4.50)

## 2019-01-21 NOTE — Addendum Note (Signed)
Addended by: Dorna Leitz on: 01/21/2019 09:53 AM   Modules accepted: Orders

## 2019-01-21 NOTE — Assessment & Plan Note (Signed)
Continues to be diet controlled.  Pending A1c.  She declines starting lisinopril, statin therapy, 81 mg aspirin due to her elevated cardiovascular risk.  She may consider this in the future .she understands her elevated risk of stroke, heart attack.

## 2019-01-21 NOTE — Assessment & Plan Note (Addendum)
Advised pelvic exam determined patient has cervix, as appears Pap was done after hysterectomy.  She politely declines pelvic exam to evaluate for cervical cancer.  Clinical breast exam performed.  Patient will schedule DEXA.

## 2019-01-21 NOTE — Assessment & Plan Note (Signed)
Doing well Wellbutrin, will continue 

## 2019-01-21 NOTE — Assessment & Plan Note (Signed)
Pending lipid panel, I have already discussed statin therapy with patient in office today due to her elevated risk, she may consider his medication to future date

## 2019-01-21 NOTE — Assessment & Plan Note (Addendum)
Slightly elevated today, she apparently declined starting lisinopril.  She prefers to keep a blood pressure log at home, she will do this and send me readings.  Of note, requested medical records from cardiology.

## 2019-01-21 NOTE — Patient Instructions (Addendum)
Monitor blood pressure,  Goal is less than 120/80, based on newest guidelines; if persistently higher, please make sooner follow up appointment so we can recheck you blood pressure and manage medications  Would advise to consider lisinopril, low dose aspirin 81 mg, and also a cholesterol medication again.   Please schedule bone density scan with mammogram; let me know if any issues in doing so  Nice to see you!      Health Maintenance for Postmenopausal Women Menopause is a normal process in which your reproductive ability comes to an end. This process happens gradually over a span of months to years, usually between the ages of 30 and 41. Menopause is complete when you have missed 12 consecutive menstrual periods. It is important to talk with your health care provider about some of the most common conditions that affect postmenopausal women, such as heart disease, cancer, and bone loss (osteoporosis). Adopting a healthy lifestyle and getting preventive care can help to promote your health and wellness. Those actions can also lower your chances of developing some of these common conditions. What should I know about menopause? During menopause, you may experience a number of symptoms, such as:  Moderate-to-severe hot flashes.  Night sweats.  Decrease in sex drive.  Mood swings.  Headaches.  Tiredness.  Irritability.  Memory problems.  Insomnia. Choosing to treat or not to treat menopausal changes is an individual decision that you make with your health care provider. What should I know about hormone replacement therapy and supplements? Hormone therapy products are effective for treating symptoms that are associated with menopause, such as hot flashes and night sweats. Hormone replacement carries certain risks, especially as you become older. If you are thinking about using estrogen or estrogen with progestin treatments, discuss the benefits and risks with your health care  provider. What should I know about heart disease and stroke? Heart disease, heart attack, and stroke become more likely as you age. This may be due, in part, to the hormonal changes that your body experiences during menopause. These can affect how your body processes dietary fats, triglycerides, and cholesterol. Heart attack and stroke are both medical emergencies. There are many things that you can do to help prevent heart disease and stroke:  Have your blood pressure checked at least every 1-2 years. High blood pressure causes heart disease and increases the risk of stroke.  If you are 28-61 years old, ask your health care provider if you should take aspirin to prevent a heart attack or a stroke.  Do not use any tobacco products, including cigarettes, chewing tobacco, or electronic cigarettes. If you need help quitting, ask your health care provider.  It is important to eat a healthy diet and maintain a healthy weight. ? Be sure to include plenty of vegetables, fruits, low-fat dairy products, and lean protein. ? Avoid eating foods that are high in solid fats, added sugars, or salt (sodium).  Get regular exercise. This is one of the most important things that you can do for your health. ? Try to exercise for at least 150 minutes each week. The type of exercise that you do should increase your heart rate and make you sweat. This is known as moderate-intensity exercise. ? Try to do strengthening exercises at least twice each week. Do these in addition to the moderate-intensity exercise.  Know your numbers.Ask your health care provider to check your cholesterol and your blood glucose. Continue to have your blood tested as directed by your health care  provider.  What should I know about cancer screening? There are several types of cancer. Take the following steps to reduce your risk and to catch any cancer development as early as possible. Breast Cancer  Practice breast self-awareness. ? This  means understanding how your breasts normally appear and feel. ? It also means doing regular breast self-exams. Let your health care provider know about any changes, no matter how small.  If you are 1 or older, have a clinician do a breast exam (clinical breast exam or CBE) every year. Depending on your age, family history, and medical history, it may be recommended that you also have a yearly breast X-ray (mammogram).  If you have a family history of breast cancer, talk with your health care provider about genetic screening.  If you are at high risk for breast cancer, talk with your health care provider about having an MRI and a mammogram every year.  Breast cancer (BRCA) gene test is recommended for women who have family members with BRCA-related cancers. Results of the assessment will determine the need for genetic counseling and BRCA1 and for BRCA2 testing. BRCA-related cancers include these types: ? Breast. This occurs in males or females. ? Ovarian. ? Tubal. This may also be called fallopian tube cancer. ? Cancer of the abdominal or pelvic lining (peritoneal cancer). ? Prostate. ? Pancreatic. Cervical, Uterine, and Ovarian Cancer Your health care provider may recommend that you be screened regularly for cancer of the pelvic organs. These include your ovaries, uterus, and vagina. This screening involves a pelvic exam, which includes checking for microscopic changes to the surface of your cervix (Pap test).  For women ages 21-65, health care providers may recommend a pelvic exam and a Pap test every three years. For women ages 86-65, they may recommend the Pap test and pelvic exam, combined with testing for human papilloma virus (HPV), every five years. Some types of HPV increase your risk of cervical cancer. Testing for HPV may also be done on women of any age who have unclear Pap test results.  Other health care providers may not recommend any screening for nonpregnant women who are  considered low risk for pelvic cancer and have no symptoms. Ask your health care provider if a screening pelvic exam is right for you.  If you have had past treatment for cervical cancer or a condition that could lead to cancer, you need Pap tests and screening for cancer for at least 20 years after your treatment. If Pap tests have been discontinued for you, your risk factors (such as having a new sexual partner) need to be reassessed to determine if you should start having screenings again. Some women have medical problems that increase the chance of getting cervical cancer. In these cases, your health care provider may recommend that you have screening and Pap tests more often.  If you have a family history of uterine cancer or ovarian cancer, talk with your health care provider about genetic screening.  If you have vaginal bleeding after reaching menopause, tell your health care provider.  There are currently no reliable tests available to screen for ovarian cancer. Lung Cancer Lung cancer screening is recommended for adults 82-70 years old who are at high risk for lung cancer because of a history of smoking. A yearly low-dose CT scan of the lungs is recommended if you:  Currently smoke.  Have a history of at least 30 pack-years of smoking and you currently smoke or have quit within the past  15 years. A pack-year is smoking an average of one pack of cigarettes per day for one year. Yearly screening should:  Continue until it has been 15 years since you quit.  Stop if you develop a health problem that would prevent you from having lung cancer treatment. Colorectal Cancer  This type of cancer can be detected and can often be prevented.  Routine colorectal cancer screening usually begins at age 77 and continues through age 71.  If you have risk factors for colon cancer, your health care provider may recommend that you be screened at an earlier age.  If you have a family history of  colorectal cancer, talk with your health care provider about genetic screening.  Your health care provider may also recommend using home test kits to check for hidden blood in your stool.  A small camera at the end of a tube can be used to examine your colon directly (sigmoidoscopy or colonoscopy). This is done to check for the earliest forms of colorectal cancer.  Direct examination of the colon should be repeated every 5-10 years until age 4. However, if early forms of precancerous polyps or small growths are found or if you have a family history or genetic risk for colorectal cancer, you may need to be screened more often. Skin Cancer  Check your skin from head to toe regularly.  Monitor any moles. Be sure to tell your health care provider: ? About any new moles or changes in moles, especially if there is a change in a mole's shape or color. ? If you have a mole that is larger than the size of a pencil eraser.  If any of your family members has a history of skin cancer, especially at a young age, talk with your health care provider about genetic screening.  Always use sunscreen. Apply sunscreen liberally and repeatedly throughout the day.  Whenever you are outside, protect yourself by wearing long sleeves, pants, a wide-brimmed hat, and sunglasses. What should I know about osteoporosis? Osteoporosis is a condition in which bone destruction happens more quickly than new bone creation. After menopause, you may be at an increased risk for osteoporosis. To help prevent osteoporosis or the bone fractures that can happen because of osteoporosis, the following is recommended:  If you are 46-84 years old, get at least 1,000 mg of calcium and at least 600 mg of vitamin D per day.  If you are older than age 14 but younger than age 34, get at least 1,200 mg of calcium and at least 600 mg of vitamin D per day.  If you are older than age 54, get at least 1,200 mg of calcium and at least 800 mg of  vitamin D per day. Smoking and excessive alcohol intake increase the risk of osteoporosis. Eat foods that are rich in calcium and vitamin D, and do weight-bearing exercises several times each week as directed by your health care provider. What should I know about how menopause affects my mental health? Depression may occur at any age, but it is more common as you become older. Common symptoms of depression include:  Low or sad mood.  Changes in sleep patterns.  Changes in appetite or eating patterns.  Feeling an overall lack of motivation or enjoyment of activities that you previously enjoyed.  Frequent crying spells. Talk with your health care provider if you think that you are experiencing depression. What should I know about immunizations? It is important that you get and maintain your  immunizations. These include:  Tetanus, diphtheria, and pertussis (Tdap) booster vaccine.  Influenza every year before the flu season begins.  Pneumonia vaccine.  Shingles vaccine. Your health care provider may also recommend other immunizations. This information is not intended to replace advice given to you by your health care provider. Make sure you discuss any questions you have with your health care provider. Document Released: 01/30/2006 Document Revised: 06/27/2016 Document Reviewed: 09/11/2015 Elsevier Interactive Patient Education  2019 [employer].

## 2019-01-21 NOTE — Progress Notes (Signed)
Subjective:    Patient ID: Julie Rush, female    DOB: 06-Oct-1951, 68 y.o.   MRN: [id number]  CC: Julie Rush is a 68 y.o. female who presents today for physical exam.    HPI: DM-diet controlled  HTN-  Stopped Lisinopril as didn't think needed it. Unsure of readings at home. No sob, cp.   Anxiety- doing well on wellbutrin. Sleeping welll.   Went to cardiology Dr Nadyne Coombes in 2018; had stress test and echo, per patient everthing was normal. No recurrence of cp.       Colorectal Cancer Screening: UTD  Breast Cancer Screening: Mammogram UTD; scheduled for 01/2019 Cervical Cancer Screening: hysterectomy. Had pap smear after hysterectomy; no vaginal bleeding; declines repeat pap smear today.  Bone Health screening/DEXA for 65+: due Lung Cancer Screening: Doesn't have 30 year pack year history and age > 66 years Immunizations       Tetanus - utd        Pneumococcal - Candidate for.  Labs: Screening labs today. Exercise: Gets regular exercise.  Alcohol use: none Smoking/tobacco use: former smoker.  Regular dental exams: In need of dental exam. Wears seat belt: Yes. Skin: No new skin lesions  HISTORY:  Past Medical History:  Diagnosis Date  . Anginal pain ([place])   . Arthritis    "knees, back, fingers, shoulders, neck"  . Chronic lower back pain   . Depression 06/08/12   "related to not getting definitive dx of what's wrong"  . Exertional dyspnea   . GERD (gastroesophageal reflux disease)   . H/O hiatal hernia   . Headache(784.0)   . History of stomach ulcers 1980's  . Hypertension   . Migraines    4-5x/yr  . Pneumonia   . PONV (postoperative nausea and vomiting)   . Sleep apnea 2013   "dx'd borderline"  . Type II diabetes mellitus ([place]) 2013   "borderline"  . Vertigo    last episode over 6 mos ago    Past Surgical History:  Procedure Laterality Date  . APPENDECTOMY  1960's  . BREAST BIOPSY     right  . CATARACT EXTRACTION W/PHACO Left 11/01/2018   Procedure: CATARACT EXTRACTION PHACO AND INTRAOCULAR LENS PLACEMENT ([place]) LEFT;  Surgeon: Eulogio Bear, MD;  Location: [facility];  Service: Ophthalmology;  Laterality: Left;  . CATARACT EXTRACTION W/PHACO Right 11/29/2018   Procedure: CATARACT EXTRACTION PHACO AND INTRAOCULAR LENS PLACEMENT ([place]) RIGHT;  Surgeon: Eulogio Bear, MD;  Location: [facility];  Service: Ophthalmology;  Laterality: Right;  Diabetes - diet conrolled sleep apnea  . CHOLECYSTECTOMY  2006  . EXPLORATORY LAPAROTOMY  late 1990's   "blocked colon S/P hernia repair"  . HERNIA REPAIR     "@ least 3 abdominal hernia repairs"  . [place]  . VAGINAL HYSTERECTOMY  1981   Family History  Problem Relation Age of Onset  . Arthritis Mother   . Cancer Father        prostate  . Hyperlipidemia Father   . Alcohol abuse Father   . Arthritis Father   . Heart disease Father   . Stroke Father   . Hypertension Father   . Diabetes Father   . Cancer Sister        ovary  . Arthritis Sister   . Cancer Brother        thyroid  . Arthritis Brother   . Cancer Sister        breast  .  Arthritis Sister   . Breast cancer Sister 55  . Arthritis Maternal Grandmother   . Arthritis Maternal Grandfather       ALLERGIES: Codeine; Prednisone; Sulfa antibiotics; and Trimethoprim  Current Outpatient Medications on File Prior to Visit  Medication Sig Dispense Refill  . acetaminophen (TYLENOL) 500 MG tablet Take 1,000 mg by mouth every 6 (six) hours as needed. For pain    . albuterol (PROVENTIL HFA) 108 (90 Base) MCG/ACT inhaler Inhale 2 puffs into the lungs every 6 (six) hours as needed for wheezing or shortness of breath. 1 Inhaler 1  . aspirin EC 81 MG tablet Take 1 tablet (81 mg total) daily by mouth. 90 tablet 3  . buPROPion (WELLBUTRIN SR) 150 MG 12 hr tablet TAKE 1 TABLET BY MOUTH TWICE A DAY 180 tablet 1  . methocarbamol (ROBAXIN) 500 MG tablet Take 1 tablet (500 mg total) by mouth daily.  30 tablet 1  . metoprolol succinate (TOPROL-XL) 50 MG 24 hr tablet TAKE 1 TABLET BY MOUTH EVERY MORNING WITH OR IMMEDIATELY FOLLOWING A MEAL 90 tablet 0  . naproxen sodium (ALEVE) 220 MG tablet Take 220 mg by mouth 2 (two) times daily as needed.    Marland Kitchen omeprazole (PRILOSEC) 20 MG capsule Take 20 mg by mouth daily as needed.    . triamcinolone (KENALOG) 0.025 % cream Apply 1 application topically 2 (two) times daily as needed.     No current facility-administered medications on file prior to visit.     Social History   Tobacco Use  . Smoking status: Former Smoker    Packs/day: 2.00    Years: 14.00    Pack years: 28.00    Last attempt to quit: 11/08/1975    Years since quitting: 43.2  . Smokeless tobacco: Never Used  Substance Use Topics  . Alcohol use: No    Alcohol/week: 0.0 standard drinks  . Drug use: No    Review of Systems  Constitutional: Negative for chills, fever and unexpected weight change.  HENT: Negative for congestion.   Respiratory: Negative for cough.   Cardiovascular: Negative for chest pain, palpitations and leg swelling.  Gastrointestinal: Negative for nausea and vomiting.  Genitourinary: Negative for pelvic pain and vaginal bleeding.  Musculoskeletal: Negative for arthralgias and myalgias.  Skin: Negative for rash.  Neurological: Negative for headaches.  Hematological: Negative for adenopathy.  Psychiatric/Behavioral: Negative for confusion and sleep disturbance.      Objective:    BP 134/78 (BP Location: Left Arm, Patient Position: Sitting, Cuff Size: Large)   Pulse 66   Temp 97.7 F (36.5 C)   Ht 5' 5.25" (1.657 m)   Wt 259 lb 12.8 oz (117.8 kg)   SpO2 96%   BMI 42.90 kg/m   BP Readings from Last 3 Encounters:  01/21/19 134/78  11/29/18 (!) 177/79  11/01/18 (!) 147/70   Wt Readings from Last 3 Encounters:  01/21/19 259 lb 12.8 oz (117.8 kg)  11/29/18 258 lb (117 kg)  11/01/18 256 lb (116.1 kg)    Physical Exam Vitals signs reviewed.    Constitutional:      Appearance: She is well-developed.  Eyes:     Conjunctiva/sclera: Conjunctivae normal.  Neck:     Thyroid: No thyroid mass or thyromegaly.  Cardiovascular:     Rate and Rhythm: Normal rate and regular rhythm.     Pulses: Normal pulses.     Heart sounds: Normal heart sounds.  Pulmonary:     Effort: Pulmonary effort is normal.  Breath sounds: Normal breath sounds. No wheezing, rhonchi or rales.  Chest:     Breasts: Breasts are symmetrical.        Right: No inverted nipple, mass, nipple discharge, skin change or tenderness.        Left: No inverted nipple, mass, nipple discharge, skin change or tenderness.  Lymphadenopathy:     Head:     Right side of head: No submental, submandibular, tonsillar, preauricular, posterior auricular or occipital adenopathy.     Left side of head: No submental, submandibular, tonsillar, preauricular, posterior auricular or occipital adenopathy.     Cervical: No cervical adenopathy.     Right cervical: No superficial, deep or posterior cervical adenopathy.    Left cervical: No superficial, deep or posterior cervical adenopathy.  Skin:    General: Skin is warm and dry.  Neurological:     Mental Status: She is alert.  Psychiatric:        Speech: Speech normal.        Behavior: Behavior normal.        Thought Content: Thought content normal.        Assessment & Plan:   Problem List Items Addressed This Visit      Cardiovascular and Mediastinum   Essential hypertension, benign - Primary    Slightly elevated today, she apparently declined starting lisinopril.  She prefers to keep a blood pressure log at home, she will do this and send me readings.  Of note, requested medical records from cardiology.      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  Pending A1c.  She declines starting lisinopril,  statin therapy, 81 mg aspirin due to her elevated cardiovascular risk.  She may consider this in the future .she understands her elevated risk of stroke, heart attack.      Relevant Orders   Hemoglobin A1c   Lipid panel   Microalbumin / creatinine urine ratio     Other   Generalized anxiety disorder    Doing well Wellbutrin, will continue      Relevant Orders   TSH   CBC with Differential/Platelet   Routine physical examination    Advised pelvic exam determined patient has cervix, as appears Pap was done after hysterectomy.  She politely declines pelvic exam to evaluate for cervical cancer.  Clinical breast exam performed.  Patient will schedule DEXA.       Other Visit Diagnoses    Screening for osteoporosis       Relevant Orders   DG Bone Density   VITAMIN D 25 Hydroxy (Vit-D Deficiency, Fractures)       I have discontinued Letta Median B. Calamari's atorvastatin. I am also having her maintain her acetaminophen, methocarbamol, albuterol, aspirin EC, buPROPion, metoprolol succinate, omeprazole, naproxen sodium, and triamcinolone.   No orders of the defined types were placed in this encounter.   Return precautions given.   Risks, benefits, and alternatives of the medications and treatment plan prescribed today were discussed, and patient expressed understanding.   Education regarding symptom management and diagnosis given to patient on AVS.   Continue to follow with Burnard Hawthorne, FNP for routine health maintenance.   Linna Hoff and I agreed with plan.   Mable Paris, FNP

## 2019-01-22 ENCOUNTER — Encounter (HOSPITAL_COMMUNITY): Payer: Self-pay | Admitting: Emergency Medicine

## 2019-01-22 ENCOUNTER — Emergency Department (HOSPITAL_COMMUNITY): Payer: Medicare Other

## 2019-01-22 ENCOUNTER — Other Ambulatory Visit: Payer: Self-pay

## 2019-01-22 ENCOUNTER — Emergency Department (HOSPITAL_COMMUNITY)
Admission: EM | Admit: 2019-01-22 | Discharge: 2019-01-23 | Disposition: A | Discharge status: Home / Self Care | Payer: Medicare Other | Attending: Emergency Medicine | Admitting: Emergency Medicine

## 2019-01-22 DIAGNOSIS — E119 Type 2 diabetes mellitus without complications: Secondary | ICD-10-CM | POA: Insufficient documentation

## 2019-01-22 DIAGNOSIS — I10 Essential (primary) hypertension: Secondary | ICD-10-CM | POA: Insufficient documentation

## 2019-01-22 DIAGNOSIS — Y92008 Other place in unspecified non-institutional (private) residence as the place of occurrence of the external cause: Secondary | ICD-10-CM | POA: Insufficient documentation

## 2019-01-22 DIAGNOSIS — Y999 Unspecified external cause status: Secondary | ICD-10-CM | POA: Diagnosis not present

## 2019-01-22 DIAGNOSIS — Z87891 Personal history of nicotine dependence: Secondary | ICD-10-CM | POA: Diagnosis not present

## 2019-01-22 DIAGNOSIS — S0990XA Unspecified injury of head, initial encounter: Secondary | ICD-10-CM | POA: Diagnosis present

## 2019-01-22 DIAGNOSIS — M25511 Pain in right shoulder: Secondary | ICD-10-CM

## 2019-01-22 DIAGNOSIS — W01198A Fall on same level from slipping, tripping and stumbling with subsequent striking against other object, initial encounter: Secondary | ICD-10-CM | POA: Insufficient documentation

## 2019-01-22 DIAGNOSIS — S022XXA Fracture of nasal bones, initial encounter for closed fracture: Secondary | ICD-10-CM | POA: Diagnosis not present

## 2019-01-22 DIAGNOSIS — Y9301 Activity, walking, marching and hiking: Secondary | ICD-10-CM | POA: Insufficient documentation

## 2019-01-22 DIAGNOSIS — W19XXXA Unspecified fall, initial encounter: Secondary | ICD-10-CM

## 2019-01-22 NOTE — ED Provider Notes (Signed)
Emergency Department Provider Note   I have reviewed the triage vital signs and the nursing notes.   HISTORY  Chief Complaint Fall   HPI Julie Rush is a 68 y.o. female presents to the emergency department for evaluation after mechanical fall.  Patient was carrying groceries into the house when she stumbled on the step and fell forwards.  She struck her face on the ground but denies loss of consciousness.  She has had some pain and swelling over the nose as well as headache and neck discomfort since the fall.  She denies any numbness or tingling.  She is also having pain in the right shoulder that is moderate and worse with movement.  No pain in the elbow or bilateral wrists.  She did fall to her knees as well but has been ambulatory since that time.  She notes a history of osteoarthritis but states that her knee pain is not severe.  No pain in the hips.  She denies any presyncope symptoms prior to fall and is sure that she tripped.  Past Medical History:  Diagnosis Date  . Anginal pain ([place])   . Arthritis    "knees, back, fingers, shoulders, neck"  . Chronic lower back pain   . Depression 06/08/12   "related to not getting definitive dx of what's wrong"  . Exertional dyspnea   . GERD (gastroesophageal reflux disease)   . H/O hiatal hernia   . Headache(784.0)   . History of stomach ulcers 1980's  . Hypertension   . Migraines    4-5x/yr  . Pneumonia   . PONV (postoperative nausea and vomiting)   . Sleep apnea 2013   "dx'd borderline"  . Type II diabetes mellitus ([place]) 2013   "borderline"  . Vertigo    last episode over 6 mos ago    Patient Active Problem List   Diagnosis Date Noted  . Breast mass, right 11/03/2017  . Routine physical examination 01/12/2017  . Chest pain 01/12/2017  . Muscle cramp 11/20/2016  . Acute bronchitis 11/20/2016  . Arthritis 04/08/2015  . Generalized anxiety disorder 04/08/2015  . Sleep apnea 04/08/2015  . GERD (gastroesophageal  reflux disease) 04/08/2015  . 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 Other dyspnea and respiratory abnormality 06/08/2012  . Essential hypertension, benign 06/08/2012  . Hyperlipidemia 06/08/2012  . Severe obesity (BMI >= 40) ([place]) 06/08/2012    Past Surgical History:  Procedure Laterality Date  . APPENDECTOMY  1960's  . BREAST BIOPSY     right  . CATARACT EXTRACTION W/PHACO Left 11/01/2018   Procedure: CATARACT EXTRACTION PHACO AND INTRAOCULAR LENS PLACEMENT ([place]) LEFT;  Surgeon: Eulogio Bear, MD;  Location: [facility];  Service: Ophthalmology;  Laterality: Left;  . CATARACT EXTRACTION W/PHACO Right 11/29/2018   Procedure: CATARACT EXTRACTION PHACO AND INTRAOCULAR LENS PLACEMENT ([place]) RIGHT;  Surgeon: Eulogio Bear, MD;  Location: [facility];  Service: Ophthalmology;  Laterality: Right;  Diabetes - diet conrolled sleep apnea  . CHOLECYSTECTOMY  2006  . EXPLORATORY LAPAROTOMY  late 1990's   "blocked colon S/P hernia repair"  . HERNIA REPAIR     "@ least 3 abdominal hernia repairs"  . [place]  . VAGINAL HYSTERECTOMY  1981    Allergies Codeine; Prednisone; Sulfa antibiotics; and Trimethoprim  Family History  Problem Relation Age of Onset  . Arthritis Mother   . Cancer Father        prostate  .  Hyperlipidemia Father   . Alcohol abuse Father   . Arthritis Father   . Heart disease Father   . Stroke Father   . Hypertension Father   . Diabetes Father   . Cancer Sister        ovary  . Arthritis Sister   . Cancer Brother        thyroid  . Arthritis Brother   . Cancer Sister        breast  . Arthritis Sister   . Breast cancer Sister 80  . Arthritis Maternal Grandmother   . Arthritis Maternal Grandfather     Social History Social History   Tobacco Use  . Smoking status: Former Smoker    Packs/day: 2.00    Years: 14.00    Pack years: 28.00    Last attempt to quit: 11/08/1975     Years since quitting: 43.2  . Smokeless tobacco: Never Used  Substance Use Topics  . Alcohol use: No    Alcohol/week: 0.0 standard drinks  . Drug use: No    Review of Systems  Constitutional: No fever/chills Eyes: No visual changes. ENT: No sore throat. Positive face/nose pain.  Cardiovascular: Denies chest pain. Respiratory: Denies shortness of breath. Gastrointestinal: No abdominal pain.  No nausea, no vomiting.  No diarrhea.  No constipation. Genitourinary: Negative for dysuria. Musculoskeletal: Negative for back pain. Positive neck pain.  Skin: Negative for rash. Neurological: Negative for focal weakness or numbness. Positive HA.   10-point ROS otherwise negative.  ____________________________________________   PHYSICAL EXAM:  VITAL SIGNS: ED Triage Vitals  Enc Vitals Group     BP 01/22/19 2032 (!) 170/75     Pulse Rate 01/22/19 2032 68     Resp 01/22/19 2032 15     Temp 01/22/19 2032 97.6 F (36.4 C)     Temp Source 01/22/19 2032 Oral     SpO2 01/22/19 2032 100 %     Weight 01/22/19 2033 259 lb (117.5 kg)     Height 01/22/19 2033 5\' 5"  (1.651 m)     Pain Score 01/22/19 2033 8   Constitutional: Alert and oriented. Well appearing and in no acute distress. Eyes: Conjunctivae are normal. PERRL. EOMI. Head: Atraumatic. Nose: Bruising and mild edema over the bridge of the nose. No septal hematomas.  Mouth/Throat: Mucous membranes are moist.  Oropharynx non-erythematous. Neck: No stridor. No cervical spine tenderness to palpation but some paraspinal tenderness on exam.  Cardiovascular: Normal rate, regular rhythm. Good peripheral circulation. Grossly normal heart sounds.   Respiratory: Normal respiratory effort.  No retractions. Lungs CTAB. Gastrointestinal: Soft and nontender. No distention.  Musculoskeletal: No lower extremity tenderness nor edema. No gross deformities of extremities. Limited ROM of the right shoulder 2/2 pain. No deformity or laceration. Normal  ROM of the right elbow and wrist. No bony tenderness.  Neurologic:  Normal speech and language. No gross focal neurologic deficits are appreciated.  Skin:  Skin is warm, dry and intact. No rash noted.  ____________________________________________  [id number]  Dg Shoulder Right  Result Date: 01/22/2019 CLINICAL DATA:  Tripped carrying groceries. EXAM: RIGHT SHOULDER - 2+ VIEW COMPARISON:  None. FINDINGS: The humeral head is well-formed and located. The subacromial, glenohumeral and acromioclavicular joint spaces are intact. Mild acromioclavicular osteoarthrosis. No destructive bony lesions. Soft tissue planes are non-suspicious. IMPRESSION: No acute osseous process. Electronically Signed   By: Elon Alas M.D.   On: 01/22/2019 23:08   Ct Head Wo Contrast  Result Date: 01/23/2019 CLINICAL DATA:  Tripped carrying groceries into house. No loss of consciousness. History of diabetes, hypertension and migraines. EXAM: CT HEAD WITHOUT CONTRAST CT MAXILLOFACIAL WITHOUT CONTRAST CT CERVICAL SPINE WITHOUT CONTRAST TECHNIQUE: Multidetector CT imaging of the head, cervical spine, and maxillofacial structures were performed using the standard protocol without intravenous contrast. Multiplanar CT image reconstructions of the cervical spine and maxillofacial structures were also generated. COMPARISON:  CT HEAD August 13, 2009. FINDINGS: CT HEAD FINDINGS BRAIN: The ventricles and sulci are normal. No intraparenchymal hemorrhage, mass effect nor midline shift. Patchy supratentorial white matter hypodensities compatible with mild chronic small vessel ischemic changes, less than expected for age. No acute large vascular territory infarcts. No abnormal extra-axial fluid collections. Basal cisterns are patent. VASCULAR: Mild calcific atherosclerosis carotid siphon. SKULL/SOFT TISSUES: Soft tissue planes are unremarkable. No skull fracture. OTHER: None. CT MAXILLOFACIAL FINDINGS OSSEOUS: Acute slightly depressed bilateral  distal nasal bone fractures. No extension to the nasal process of the maxilla or nasal spine. The mandible is intact, the condyles are located. No destructive bony lesions. ORBITS: Non suspicious.  Status post bilateral ocular lens implants. SINUSES: Paranasal sinuses are well aerated. Intact nasal septum is midline. Mastoid aircells are well aerated. SOFT TISSUES: Mild mid face soft tissue swelling without subcutaneous gas or radiopaque foreign bodies. CT CERVICAL SPINE FINDINGS ALIGNMENT: Maintenance of cervical lordosis. Minimal grade 1 C4-5 anterolisthesis. SKULL BASE AND VERTEBRAE: Cervical vertebral bodies and posterior elements are intact. Moderate C5-6 and C6-7 disc height loss with endplate spurring compatible with degenerative discs. Severe RIGHT C2-3 and C3-4 facet arthropathy. LEFT C4-5 facets fused on degenerative basis. C1-2 articulation maintained with mild arthropathy, calcified longus coli insertion. SOFT TISSUES AND SPINAL CANAL: Nonacute. Moderate RIGHT and mild LEFT calcific atherosclerosis carotid bifurcations. DISC LEVELS: Mild canal stenosis C5-6. Moderate RIGHT C3-4 and LEFT C5-6 neural foraminal narrowing. UPPER CHEST: Lung apices are clear. OTHER: None. IMPRESSION: CT HEAD: 1. Normal CT HEAD without contrast for age. CT MAXILLOFACIAL: 1. Acute displaced bilateral nasal bone fractures. CT CERVICAL SPINE: 1. No fracture. Grade 1 C4-5 anterolisthesis on a degenerative basis. Electronically Signed   By: Elon Alas M.D.   On: 01/23/2019 00:12   Ct Cervical Spine Wo Contrast  Result Date: 01/23/2019 CLINICAL DATA:  Tripped carrying groceries into house. No loss of consciousness. History of diabetes, hypertension and migraines. EXAM: CT HEAD WITHOUT CONTRAST CT MAXILLOFACIAL WITHOUT CONTRAST CT CERVICAL SPINE WITHOUT CONTRAST TECHNIQUE: Multidetector CT imaging of the head, cervical spine, and maxillofacial structures were performed using the standard protocol without intravenous  contrast. Multiplanar CT image reconstructions of the cervical spine and maxillofacial structures were also generated. COMPARISON:  CT HEAD August 13, 2009. FINDINGS: CT HEAD FINDINGS BRAIN: The ventricles and sulci are normal. No intraparenchymal hemorrhage, mass effect nor midline shift. Patchy supratentorial white matter hypodensities compatible with mild chronic small vessel ischemic changes, less than expected for age. No acute large vascular territory infarcts. No abnormal extra-axial fluid collections. Basal cisterns are patent. VASCULAR: Mild calcific atherosclerosis carotid siphon. SKULL/SOFT TISSUES: Soft tissue planes are unremarkable. No skull fracture. OTHER: None. CT MAXILLOFACIAL FINDINGS OSSEOUS: Acute slightly depressed bilateral distal nasal bone fractures. No extension to the nasal process of the maxilla or nasal spine. The mandible is intact, the condyles are located. No destructive bony lesions. ORBITS: Non suspicious.  Status post bilateral ocular lens implants. SINUSES: Paranasal sinuses are well aerated. Intact nasal septum is midline. Mastoid aircells are well aerated. SOFT TISSUES: Mild mid face soft tissue swelling without subcutaneous gas or radiopaque  foreign bodies. CT CERVICAL SPINE FINDINGS ALIGNMENT: Maintenance of cervical lordosis. Minimal grade 1 C4-5 anterolisthesis. SKULL BASE AND VERTEBRAE: Cervical vertebral bodies and posterior elements are intact. Moderate C5-6 and C6-7 disc height loss with endplate spurring compatible with degenerative discs. Severe RIGHT C2-3 and C3-4 facet arthropathy. LEFT C4-5 facets fused on degenerative basis. C1-2 articulation maintained with mild arthropathy, calcified longus coli insertion. SOFT TISSUES AND SPINAL CANAL: Nonacute. Moderate RIGHT and mild LEFT calcific atherosclerosis carotid bifurcations. DISC LEVELS: Mild canal stenosis C5-6. Moderate RIGHT C3-4 and LEFT C5-6 neural foraminal narrowing. UPPER CHEST: Lung apices are clear. OTHER:  None. IMPRESSION: CT HEAD: 1. Normal CT HEAD without contrast for age. CT MAXILLOFACIAL: 1. Acute displaced bilateral nasal bone fractures. CT CERVICAL SPINE: 1. No fracture. Grade 1 C4-5 anterolisthesis on a degenerative basis. Electronically Signed   By: Elon Alas M.D.   On: 01/23/2019 00:12   Ct Maxillofacial Wo Contrast  Result Date: 01/23/2019 CLINICAL DATA:  Tripped carrying groceries into house. No loss of consciousness. History of diabetes, hypertension and migraines. EXAM: CT HEAD WITHOUT CONTRAST CT MAXILLOFACIAL WITHOUT CONTRAST CT CERVICAL SPINE WITHOUT CONTRAST TECHNIQUE: Multidetector CT imaging of the head, cervical spine, and maxillofacial structures were performed using the standard protocol without intravenous contrast. Multiplanar CT image reconstructions of the cervical spine and maxillofacial structures were also generated. COMPARISON:  CT HEAD August 13, 2009. FINDINGS: CT HEAD FINDINGS BRAIN: The ventricles and sulci are normal. No intraparenchymal hemorrhage, mass effect nor midline shift. Patchy supratentorial white matter hypodensities compatible with mild chronic small vessel ischemic changes, less than expected for age. No acute large vascular territory infarcts. No abnormal extra-axial fluid collections. Basal cisterns are patent. VASCULAR: Mild calcific atherosclerosis carotid siphon. SKULL/SOFT TISSUES: Soft tissue planes are unremarkable. No skull fracture. OTHER: None. CT MAXILLOFACIAL FINDINGS OSSEOUS: Acute slightly depressed bilateral distal nasal bone fractures. No extension to the nasal process of the maxilla or nasal spine. The mandible is intact, the condyles are located. No destructive bony lesions. ORBITS: Non suspicious.  Status post bilateral ocular lens implants. SINUSES: Paranasal sinuses are well aerated. Intact nasal septum is midline. Mastoid aircells are well aerated. SOFT TISSUES: Mild mid face soft tissue swelling without subcutaneous gas or radiopaque  foreign bodies. CT CERVICAL SPINE FINDINGS ALIGNMENT: Maintenance of cervical lordosis. Minimal grade 1 C4-5 anterolisthesis. SKULL BASE AND VERTEBRAE: Cervical vertebral bodies and posterior elements are intact. Moderate C5-6 and C6-7 disc height loss with endplate spurring compatible with degenerative discs. Severe RIGHT C2-3 and C3-4 facet arthropathy. LEFT C4-5 facets fused on degenerative basis. C1-2 articulation maintained with mild arthropathy, calcified longus coli insertion. SOFT TISSUES AND SPINAL CANAL: Nonacute. Moderate RIGHT and mild LEFT calcific atherosclerosis carotid bifurcations. DISC LEVELS: Mild canal stenosis C5-6. Moderate RIGHT C3-4 and LEFT C5-6 neural foraminal narrowing. UPPER CHEST: Lung apices are clear. OTHER: None. IMPRESSION: CT HEAD: 1. Normal CT HEAD without contrast for age. CT MAXILLOFACIAL: 1. Acute displaced bilateral nasal bone fractures. CT CERVICAL SPINE: 1. No fracture. Grade 1 C4-5 anterolisthesis on a degenerative basis. Electronically Signed   By: Elon Alas M.D.   On: 01/23/2019 00:12    ____________________________________________   PROCEDURES  Procedure(s) performed:   Procedures  None ____________________________________________   INITIAL IMPRESSION / ASSESSMENT AND PLAN / ED COURSE  Pertinent labs & imaging results that were available during my care of the patient were reviewed by me and considered in my medical decision making (see chart for details).  Patient presents to the emergency department for  evaluation after mechanical fall.  The plain film the right shoulder has been reviewed which shows no acute bony abnormality.  CT imaging of the head, face, neck are pending.   CT imaging and plain films reviewed. Patient with nasal bone fx. Advised ice, OTC pain medication, and ENT follow up. Discussed f/u with ortho if shoulder remains very painful in 1-2 weeks to evaluate for rotator cuff injury.    ____________________________________________  FINAL CLINICAL IMPRESSION(S) / ED DIAGNOSES  Final diagnoses:  Injury of head, initial encounter  Fall, initial encounter  Closed fracture of nasal bone, initial encounter  Acute pain of right shoulder    Note:  This document was prepared using Dragon voice recognition software and may include unintentional dictation errors.  Nanda Quinton, MD Emergency Medicine    Long, Wonda [facility], MD 01/23/19 [phone]

## 2019-01-22 NOTE — ED Triage Notes (Signed)
Pt tripped carrying groceries into house. Pt denies LOC. Hurting to right shoulder and nose.

## 2019-01-23 NOTE — Discharge Instructions (Signed)
You were seen in the ED today after a fall. You have a fracture/break of your nose. This will heal on its own. Do not blow your nose and sneeze with your mouth open. Call the ENT doctor on Monday to schedule a follow up appointment. Place ice on your nose and shoulder as needed over the next 12-16 hours. Take Tylenol for pain. Call your PCP for a follow up appointment. Call the Orthopedic surgeon listed if you shoulder pain does not improve without 1-2 weeks. Your PCP may also help you with this issue and decide when an orthopedist is needed. Return to the ED with any new or worsening symptoms.

## 2019-01-25 ENCOUNTER — Ambulatory Visit
Admission: RE | Admit: 2019-01-25 | Discharge: 2019-01-25 | Disposition: A | Discharge status: Home / Self Care | Payer: Medicare Other | Source: Ambulatory Visit | Attending: Family | Admitting: Family

## 2019-01-25 DIAGNOSIS — Z1231 Encounter for screening mammogram for malignant neoplasm of breast: Secondary | ICD-10-CM

## 2019-02-01 ENCOUNTER — Other Ambulatory Visit: Payer: Self-pay | Admitting: Orthopedic Surgery

## 2019-02-01 ENCOUNTER — Ambulatory Visit: Payer: Medicare Other

## 2019-02-01 DIAGNOSIS — M25511 Pain in right shoulder: Secondary | ICD-10-CM

## 2019-02-04 ENCOUNTER — Ambulatory Visit
Admission: RE | Admit: 2019-02-04 | Discharge: 2019-02-04 | Disposition: A | Discharge status: Home / Self Care | Payer: Medicare Other | Source: Ambulatory Visit | Attending: Orthopedic Surgery | Admitting: Orthopedic Surgery

## 2019-02-04 DIAGNOSIS — M25511 Pain in right shoulder: Secondary | ICD-10-CM

## 2019-02-08 ENCOUNTER — Telehealth: Payer: Self-pay | Admitting: Family

## 2019-02-08 NOTE — Telephone Encounter (Signed)
Pt dropped off a form from [facility], paper needs to be completed by Arnett and faxed. Paper is up front in Arnett's color folder.

## 2019-02-10 NOTE — Telephone Encounter (Signed)
Patient made appointment for tomorrow for surgical clearance.

## 2019-02-11 ENCOUNTER — Ambulatory Visit (INDEPENDENT_AMBULATORY_CARE_PROVIDER_SITE_OTHER): Payer: Medicare Other | Admitting: Family

## 2019-02-11 ENCOUNTER — Encounter: Payer: Self-pay | Admitting: Family

## 2019-02-11 VITALS — BP 122/80 | HR 70 | Wt 266.6 lb

## 2019-02-11 DIAGNOSIS — I10 Essential (primary) hypertension: Secondary | ICD-10-CM

## 2019-02-11 DIAGNOSIS — E785 Hyperlipidemia, unspecified: Secondary | ICD-10-CM | POA: Insufficient documentation

## 2019-02-11 DIAGNOSIS — Z01818 Encounter for other preprocedural examination: Secondary | ICD-10-CM | POA: Diagnosis not present

## 2019-02-11 NOTE — Assessment & Plan Note (Signed)
Declines medication treatment at this time.  Advised importance to repeat serially.  Will follow

## 2019-02-11 NOTE — Progress Notes (Signed)
Subjective:    Patient ID: Julie Rush, female    DOB: 03-Jun-1951, 68 y.o.   MRN: [id number]  CC: Julie Rush is a 68 y.o. female who presents today for follow up.   HPI: Here for surgical clearance for right shoulder rotator cuff repair date to be determined.  [facility], Dr Tonita Cong.   will receive general anesthesia  Feels well aside from right shoulder pain.  Has stopped aspirin last week.   Told doesn't need a cipap machine years ago.  HTN- on metoprolol. Denies orthopnea, exertional chest pain or pressure, numbness or tingling radiating to left arm or jaw, palpitations, dizziness, frequent headaches, changes in vision, or shortness of breath.  Anxiety - doing on wellbutrin.   GERD-doing well on prn prilosec, no trouble swallowing  HLD - doesn't desire medication.      HISTORY:  Past Medical History:  Diagnosis Date  . Anginal pain ([place])   . Arthritis    "knees, back, fingers, shoulders, neck"  . Chronic lower back pain   . Depression 06/08/12   "related to not getting definitive dx of what's wrong"  . Exertional dyspnea   . GERD (gastroesophageal reflux disease)   . H/O hiatal hernia   . Headache(784.0)   . History of stomach ulcers 1980's  . Hypertension   . Migraines    4-5x/yr  . Pneumonia   . PONV (postoperative nausea and vomiting)   . Sleep apnea 2013   "dx'd borderline"  . Type II diabetes mellitus ([place]) 2013   "borderline"  . Vertigo    last episode over 6 mos ago   Past Surgical History:  Procedure Laterality Date  . APPENDECTOMY  1960's  . BREAST BIOPSY     right  . CATARACT EXTRACTION W/PHACO Left 11/01/2018   Procedure: CATARACT EXTRACTION PHACO AND INTRAOCULAR LENS PLACEMENT ([place]) LEFT;  Surgeon: Eulogio Bear, MD;  Location: [facility];  Service: Ophthalmology;  Laterality: Left;  . CATARACT EXTRACTION W/PHACO Right 11/29/2018   Procedure: CATARACT EXTRACTION PHACO AND INTRAOCULAR LENS PLACEMENT ([place]) RIGHT;   Surgeon: Eulogio Bear, MD;  Location: [facility];  Service: Ophthalmology;  Laterality: Right;  Diabetes - diet conrolled sleep apnea  . CHOLECYSTECTOMY  2006  . EXPLORATORY LAPAROTOMY  late 1990's   "blocked colon S/P hernia repair"  . HERNIA REPAIR     "@ least 3 abdominal hernia repairs"  . [place]  . VAGINAL HYSTERECTOMY  1981   Family History  Problem Relation Age of Onset  . Arthritis Mother   . Cancer Father        prostate  . Hyperlipidemia Father   . Alcohol abuse Father   . Arthritis Father   . Heart disease Father   . Stroke Father   . Hypertension Father   . Diabetes Father   . Cancer Sister        ovary  . Arthritis Sister   . Cancer Brother        thyroid  . Arthritis Brother   . Cancer Sister        breast  . Arthritis Sister   . Breast cancer Sister 53  . Arthritis Maternal Grandmother   . Arthritis Maternal Grandfather     Allergies: Codeine; Prednisone; Sulfa antibiotics; and Trimethoprim Current Outpatient Medications on File Prior to Visit  Medication Sig Dispense Refill  . acetaminophen (TYLENOL) 500 MG tablet Take 1,000 mg by mouth every 6 (six) hours  as needed. For pain    . albuterol (PROVENTIL HFA) 108 (90 Base) MCG/ACT inhaler Inhale 2 puffs into the lungs every 6 (six) hours as needed for wheezing or shortness of breath. 1 Inhaler 1  . aspirin EC 81 MG tablet Take 1 tablet (81 mg total) daily by mouth. 90 tablet 3  . buPROPion (WELLBUTRIN SR) 150 MG 12 hr tablet TAKE 1 TABLET BY MOUTH TWICE A DAY 180 tablet 1  . methocarbamol (ROBAXIN) 500 MG tablet Take 1 tablet (500 mg total) by mouth daily. 30 tablet 1  . metoprolol succinate (TOPROL-XL) 50 MG 24 hr tablet TAKE 1 TABLET BY MOUTH EVERY MORNING WITH OR IMMEDIATELY FOLLOWING A MEAL 90 tablet 0  . naproxen sodium (ALEVE) 220 MG tablet Take 220 mg by mouth 2 (two) times daily as needed.    Marland Kitchen omeprazole (PRILOSEC) 20 MG capsule Take 20 mg by mouth daily as needed.     . triamcinolone (KENALOG) 0.025 % cream Apply 1 application topically 2 (two) times daily as needed.     No current facility-administered medications on file prior to visit.     Social History   Tobacco Use  . Smoking status: Former Smoker    Packs/day: 2.00    Years: 14.00    Pack years: 28.00    Last attempt to quit: 11/08/1975    Years since quitting: 43.2  . Smokeless tobacco: Never Used  Substance Use Topics  . Alcohol use: No    Alcohol/week: 0.0 standard drinks  . Drug use: No    Review of Systems  Constitutional: Negative for chills and fever.  Respiratory: Negative for cough.   Cardiovascular: Negative for chest pain and palpitations.  Gastrointestinal: Negative for nausea and vomiting.  Musculoskeletal: Positive for arthralgias (right shoulder).      Objective:    BP 122/80 (BP Location: Left Arm, Patient Position: Sitting, Cuff Size: Large)   Pulse 70   Wt 266 lb 9.6 oz (120.9 kg)   SpO2 98%   BMI 44.36 kg/m  BP Readings from Last 3 Encounters:  02/11/19 122/80  01/23/19 (!) 147/87  01/21/19 134/78   Wt Readings from Last 3 Encounters:  02/11/19 266 lb 9.6 oz (120.9 kg)  01/22/19 259 lb (117.5 kg)  01/21/19 259 lb 12.8 oz (117.8 kg)    Physical Exam Vitals signs reviewed.  Constitutional:      Appearance: She is well-developed.  Eyes:     Conjunctiva/sclera: Conjunctivae normal.  Cardiovascular:     Rate and Rhythm: Normal rate and regular rhythm.     Pulses: Normal pulses.     Heart sounds: Normal heart sounds.  Pulmonary:     Effort: Pulmonary effort is normal.     Breath sounds: Normal breath sounds. No wheezing, rhonchi or rales.  Skin:    General: Skin is warm and dry.  Neurological:     Mental Status: She is alert.  Psychiatric:        Speech: Speech normal.        Behavior: Behavior normal.        Thought Content: Thought content normal.        Assessment & Plan:   Problem List Items Addressed This Visit       Cardiovascular and Mediastinum   Essential hypertension, benign    Well-controlled.  Continue current regimen.        Other   Pre-operative clearance - Primary    She is asymptomatic. EKG doesn't show acute changes.  No significant changes from 2013. Concern for possible LVH. Advised patient that we should pursue cardiology referral prior to surgical clearance which she was in agreement.Pending PT/INR       Relevant Orders   EKG 12-Lead (Completed)   Protime-INR   Ambulatory referral to Cardiology   HLD (hyperlipidemia)    Declines medication treatment at this time.  Advised importance to repeat serially.  Will follow          I am having Jennfier B. Eberle maintain her acetaminophen, methocarbamol, albuterol, aspirin EC, buPROPion, metoprolol succinate, omeprazole, naproxen sodium, and triamcinolone.   No orders of the defined types were placed in this encounter.   Return precautions given.   Risks, benefits, and alternatives of the medications and treatment plan prescribed today were discussed, and patient expressed understanding.   Education regarding symptom management and diagnosis given to patient on AVS.  Continue to follow with Burnard Hawthorne, FNP for routine health maintenance.   Linna Hoff and I agreed with plan.   Mable Paris, FNP

## 2019-02-11 NOTE — Assessment & Plan Note (Signed)
Well-controlled.  Continue current regimen. 

## 2019-02-11 NOTE — Assessment & Plan Note (Addendum)
She is asymptomatic. EKG doesn't show acute changes. No significant changes from 2013. Concern for possible LVH. Advised patient that we should pursue cardiology referral prior to surgical clearance which she was in agreement.Pending PT/INR

## 2019-02-11 NOTE — Patient Instructions (Signed)
Let us be in close touch on Monday in regards to cardiology, will expedite this as best we can   today we discussed referrals, orders. cardiology   I have placed these orders in the system for you.  Please be sure to give [place] a call if you have not heard from our office regarding this. We should hear from [place] within ONE week with information regarding your appointment. If not, please let me know immediately.   Nice to see you today

## 2019-02-12 LAB — PROTIME-INR
INR: 0.9
Prothrombin Time: 9.9 s (ref 9.0–11.5)

## 2019-02-14 ENCOUNTER — Encounter: Payer: Self-pay | Admitting: Family

## 2019-02-14 NOTE — Telephone Encounter (Signed)
Copied from [place] [id number]. Topic: Quick Communication - See Telephone Encounter >> Feb 14, 2019  3:26 PM Vernona Rieger wrote: CRM for notification. See Telephone encounter for: 02/14/19.  Pt states she has not heard from [facility].

## 2019-02-28 ENCOUNTER — Encounter: Payer: Self-pay | Admitting: Family

## 2019-03-01 ENCOUNTER — Other Ambulatory Visit: Payer: Self-pay | Admitting: Family

## 2019-03-01 DIAGNOSIS — F411 Generalized anxiety disorder: Secondary | ICD-10-CM

## 2019-03-02 ENCOUNTER — Telehealth: Payer: Self-pay

## 2019-03-02 ENCOUNTER — Encounter: Payer: Self-pay | Admitting: Family

## 2019-03-02 NOTE — Telephone Encounter (Signed)
Copied from [place] [id number]. Topic: General - Other >> Mar 02, 2019  3:05 PM Sheran Luz wrote: Reason for CRM: Patient calling back regarding mychart message from today- Requesting a call back from [place] or CMA, if possible. Please advise.

## 2019-03-02 NOTE — Telephone Encounter (Signed)
I called & spoke with [facility] & asked that send surgical clearance form to Dr. Bethanne Ginger office. Patient has stress test on 02/28/2019 & received cardiac clearance from them. We have already faxed Margaret's form prior. I called patient to let her know this & ask that she stay diligent with making sure that [facility] as well as Dr. Bethanne Ginger office that form was sent.

## 2019-03-08 ENCOUNTER — Ambulatory Visit: Payer: Self-pay | Admitting: Orthopedic Surgery

## 2019-03-08 NOTE — H&P (Signed)
Julie Rush is an 68 y.o. female.   Chief Complaint: Right shoulder pain HPI: Reason for Visit: Diagnositc Results (right shoulder MRI) Context: The patient is 2 1/1 weeks out from a fall Location (Upper Extremity): shoulder pain on the right Severity: pain level 8/10 Timing: come and go Aggravating Factors: ROM Medications: The patient is taking Aleve and Tylenol She is right-hand dominant difficult raising her arm up.  Past Medical History:  Diagnosis Date  . Anginal pain ([place])   . Arthritis    "knees, back, fingers, shoulders, neck"  . Chronic lower back pain   . Depression 06/08/12   "related to not getting definitive dx of what's wrong"  . Exertional dyspnea   . GERD (gastroesophageal reflux disease)   . H/O hiatal hernia   . Headache(784.0)   . History of stomach ulcers 1980's  . Hypertension   . Migraines    4-5x/yr  . Pneumonia   . PONV (postoperative nausea and vomiting)   . Sleep apnea 2013   "dx'd borderline"  . Type II diabetes mellitus ([place]) 2013   "borderline"  . Vertigo    last episode over 6 mos ago    Past Surgical History:  Procedure Laterality Date  . APPENDECTOMY  1960's  . BREAST BIOPSY     right  . CATARACT EXTRACTION W/PHACO Left 11/01/2018   Procedure: CATARACT EXTRACTION PHACO AND INTRAOCULAR LENS PLACEMENT ([place]) LEFT;  Surgeon: Eulogio Bear, MD;  Location: [place];  Service: Ophthalmology;  Laterality: Left;  . CATARACT EXTRACTION W/PHACO Right 11/29/2018   Procedure: CATARACT EXTRACTION PHACO AND INTRAOCULAR LENS PLACEMENT ([place]) RIGHT;  Surgeon: Eulogio Bear, MD;  Location: [place];  Service: Ophthalmology;  Laterality: Right;  Diabetes - diet conrolled sleep apnea  . CHOLECYSTECTOMY  2006  . EXPLORATORY LAPAROTOMY  late 1990's   "blocked colon S/P hernia repair"  . HERNIA REPAIR     "@ least 3 abdominal hernia repairs"  . [place]  . VAGINAL HYSTERECTOMY  1981    Family History   Problem Relation Age of Onset  . Arthritis Mother   . Cancer Father        prostate  . Hyperlipidemia Father   . Alcohol abuse Father   . Arthritis Father   . Heart disease Father   . Stroke Father   . Hypertension Father   . Diabetes Father   . Cancer Sister        ovary  . Arthritis Sister   . Cancer Brother        thyroid  . Arthritis Brother   . Cancer Sister        breast  . Arthritis Sister   . Breast cancer Sister 48  . Arthritis Maternal Grandmother   . Arthritis Maternal Grandfather    Social History:  reports that she quit smoking about 43 years ago. She has a 28.00 pack-year smoking history. She has never used smokeless tobacco. She reports that she does not drink alcohol or use drugs.  Allergies:  Allergies  Allergen Reactions  . Codeine Nausea And Vomiting  . Prednisone Palpitations  . Sulfa Antibiotics Itching and Rash  . Trimethoprim Hives   Medications: Aleve 220 mg capsule buPROPion HCL SR 150 mg tablet,12 hr sustained-release methocarbamoL 500 mg tablet metoprolol succinate ER 50 mg tablet,extended release 24 hr PriLOSEC triamcinolone acetonide 0.1 % topical cream Tylenol Extra Strength Ventolin HFA 90 mcg/actuation aerosol inhaler  Review of Systems  Constitutional: Negative.   HENT: Negative.   Eyes: Negative.   Respiratory: Negative.   Cardiovascular: Negative.   Gastrointestinal: Negative.   Genitourinary: Negative.   Musculoskeletal: Positive for joint pain.  Skin: Negative.   Neurological: Negative.   Psychiatric/Behavioral: Negative.     There were no vitals taken for this visit. Physical Exam  Constitutional: She is oriented to person, place, and time. She appears well-developed and well-nourished.  HENT:  Head: Normocephalic.  Eyes: Pupils are equal, round, and reactive to light.  Neck: Normal range of motion.  Cardiovascular: Normal rate.  Respiratory: Effort normal.  GI: Soft. Bowel sounds are normal.  Musculoskeletal:      Comments: Patient is a 68 year old female.  Constitutional: General Appearance: healthy-appearing and NAD.  Psychiatric: Mood and Affect: normal mood and affect.  Cardiovascular System: Arterial Pulses Right: radial normal and brachial normal. Varicosities Right: no varicosities.  C-Spine/Neck: Active Range of Motion: flexion normal, extension normal, and no pain elicited on motion.  Shoulders: Inspection Right: no misalignment, atrophy, erythema, swelling, or scapular winging. Bony Palpation Right: no tenderness of the sternoclavicular joint, the coracoid process, the acromioclavicular joint, the bicipital groove, or the scapula. Soft Tissue Palpation Right: tenderness of the supraspinatus and the subacromial bursa. Active Range of Motion Right: limited. Special Tests Right: Speed's test negative and Neer's test positive. Stability Right: no laxity, sulcus sign negative, and anterior apprehension test negative. Strength Right: abduction 5/5, adduction 5/5, flexion 5/5, and extension 5/5.  Skin: Right Upper Extremity: normal.  Neurological System: Biceps Reflex Right: normal (2). Brachioradialis Reflex Right: normal (2). Triceps Reflex Right: normal (2). Sensation on the Right: C5 normal, C6 normal, and C7 normal.  Normal cervical lordosis. No pain with range of motion. No palpable tenderness. Motor is 5/5 in all groups in the upper extremities. Upper extremity sensory exam normal. Patient is normoreflexic in the upper extremities. No Hoffmann sign.  Neurological: She is alert and oriented to person, place, and time.  Skin: Skin is warm and dry.    MRI of the right shoulder: Full-thickness retroactive tears of the rotator cuff of the supraspinatus infraspinatus. Mild glenohumeral arthrosis. Minimal muscular atrophy. No fat atrophy..   Assessment/Plan Patient demonstrates an acute full-thickness tear retracted of the rotator cuff including infraspinatus and supraspinatus.  Given the  absence of any significant atrophy discussed rotator cuff repair  An extensive discussion concerning the pathology relevant anatomy and treatment options. After that discussion we mutually agreed to proceed with repair of the rotator cuff utilizing arthroscopic assistance if possible. The risks and benefits of that procedure were discussed including bleeding, infection, suboptimal range of motion, deep venous thrombosis, pulmonary embolism, anesthetic complications etc. in addition we discussed the postoperative course to include approximately 4 weeks of passive range of motion followed by 4 weeks of active range of motion followed by 4-12 weeks of progressive strengthening exercises. In addition we discussed protective activities to reduce the risk of a reinjury including impingement activities with elbow above the shoulder as well as reaching and repetitive circular motion activities. The hospital stay will either be as a outpatient with a regional block versus overnight depending upon the extent of the procedure and any challenging health issues with a first postoperative visit 2 weeks following the surgery.  Would suggest a mini open a possible patch graft.  She is otherwise healthy. Hypertension.  We will procedure as a outpatient evaluation  Plan right shoulder mini-open RCR, SAD, possible patch graft  Cecilie Kicks, PA-C for  Dr. Tonita Cong 03/08/2019, 9:55 AM

## 2019-03-23 ENCOUNTER — Ambulatory Visit: Admit: 2019-03-23 | Payer: Medicare Other | Admitting: Specialist

## 2019-03-23 SURGERY — SHOULDER ARTHROSCOPY WITH ROTATOR CUFF REPAIR AND SUBACROMIAL DECOMPRESSION
Anesthesia: General | Laterality: Right

## 2019-05-05 ENCOUNTER — Ambulatory Visit: Payer: Self-pay | Admitting: Orthopedic Surgery

## 2019-05-05 NOTE — H&P (View-Only) (Signed)
Julie Rush is an 68 y.o. female.   Chief Complaint: Right shoulder pain HPI: Reason for Visit: Diagnositc Results (right shoulder MRI) Context: The patient is 4 months out from a fall Location (Upper Extremity): shoulder pain on the right Severity: pain level 8/10 Timing: come and go Aggravating Factors: ROM Medications: The patient is taking Aleve and Tylenol She is right-hand dominant difficult raising her arm up.      Past Medical History:  Diagnosis Date  . Anginal pain ([place])   . Arthritis    "knees, back, fingers, shoulders, neck"  . Chronic lower back pain   . Depression 06/08/12   "related to not getting definitive dx of what's wrong"  . Exertional dyspnea   . GERD (gastroesophageal reflux disease)   . H/O hiatal hernia   . Headache(784.0)   . History of stomach ulcers 1980's  . Hypertension   . Migraines    4-5x/yr  . Pneumonia   . PONV (postoperative nausea and vomiting)   . Sleep apnea 2013   "dx'd borderline"  . Type II diabetes mellitus ([place]) 2013   "borderline"  . Vertigo    last episode over 6 mos ago         Past Surgical History:  Procedure Laterality Date  . APPENDECTOMY  1960's  . BREAST BIOPSY     right  . CATARACT EXTRACTION W/PHACO Left 11/01/2018   Procedure: CATARACT EXTRACTION PHACO AND INTRAOCULAR LENS PLACEMENT ([place]) LEFT;  Surgeon: Eulogio Bear, MD;  Location: [place];  Service: Ophthalmology;  Laterality: Left;  . CATARACT EXTRACTION W/PHACO Right 11/29/2018   Procedure: CATARACT EXTRACTION PHACO AND INTRAOCULAR LENS PLACEMENT ([place]) RIGHT;  Surgeon: Eulogio Bear, MD;  Location: [place];  Service: Ophthalmology;  Laterality: Right;  Diabetes - diet conrolled sleep apnea  . CHOLECYSTECTOMY  2006  . EXPLORATORY LAPAROTOMY  late 1990's   "blocked colon S/P hernia repair"  . HERNIA REPAIR     "@ least 3 abdominal hernia repairs"  . [place]  .  VAGINAL HYSTERECTOMY  1981         Family History  Problem Relation Age of Onset  . Arthritis Mother   . Cancer Father        prostate  . Hyperlipidemia Father   . Alcohol abuse Father   . Arthritis Father   . Heart disease Father   . Stroke Father   . Hypertension Father   . Diabetes Father   . Cancer Sister        ovary  . Arthritis Sister   . Cancer Brother        thyroid  . Arthritis Brother   . Cancer Sister        breast  . Arthritis Sister   . Breast cancer Sister 63  . Arthritis Maternal Grandmother   . Arthritis Maternal Grandfather    Social History:  reports that she quit smoking about 43 years ago. She has a 28.00 pack-year smoking history. She has never used smokeless tobacco. She reports that she does not drink alcohol or use drugs.  Allergies:      Allergies  Allergen Reactions  . Codeine Nausea And Vomiting  . Prednisone Palpitations  . Sulfa Antibiotics Itching and Rash  . Trimethoprim Hives   Medications: Aleve 220 mg capsule buPROPion HCL SR 150 mg tablet,12 hr sustained-release methocarbamoL 500 mg tablet metoprolol succinate ER 50 mg tablet,extended release 24 hr PriLOSEC triamcinolone acetonide 0.1 %  topical cream Tylenol Extra Strength Ventolin HFA 90 mcg/actuation aerosol inhaler  Review of Systems  Constitutional: Negative.   HENT: Negative.   Eyes: Negative.   Respiratory: Negative.   Cardiovascular: Negative.   Gastrointestinal: Negative.   Genitourinary: Negative.   Musculoskeletal: Positive for joint pain.  Skin: Negative.   Neurological: Negative.   Psychiatric/Behavioral: Negative.     There were no vitals taken for this visit. Physical Exam  Constitutional: She is oriented to person, place, and time. She appears well-developed and well-nourished.  HENT:  Head: Normocephalic.  Eyes: Pupils are equal, round, and reactive to light.  Neck: Normal range of motion.  Cardiovascular: Normal  rate.  Respiratory: Effort normal.  GI: Soft. Bowel sounds are normal.  Musculoskeletal:     Comments: Patient is a 68 year old female.  Constitutional: General Appearance: healthy-appearing and NAD.  Psychiatric: Mood and Affect: normal mood and affect.  Cardiovascular System: Arterial Pulses Right: radial normal and brachial normal. Varicosities Right: no varicosities.  C-Spine/Neck: Active Range of Motion: flexion normal, extension normal, and no pain elicited on motion.  Shoulders: Inspection Right: no misalignment, atrophy, erythema, swelling, or scapular winging. Bony Palpation Right: no tenderness of the sternoclavicular joint, the coracoid process, the acromioclavicular joint, the bicipital groove, or the scapula. Soft Tissue Palpation Right: tenderness of the supraspinatus and the subacromial bursa. Active Range of Motion Right: limited. Special Tests Right: Speed's test negative and Neer's test positive. Stability Right: no laxity, sulcus sign negative, and anterior apprehension test negative. Strength Right: abduction 5/5, adduction 5/5, flexion 5/5, and extension 5/5.  Skin: Right Upper Extremity: normal.  Neurological System: Biceps Reflex Right: normal (2). Brachioradialis Reflex Right: normal (2). Triceps Reflex Right: normal (2). Sensation on the Right: C5 normal, C6 normal, and C7 normal.  Normal cervical lordosis. No pain with range of motion. No palpable tenderness. Motor is 5/5 in all groups in the upper extremities. Upper extremity sensory exam normal. Patient is normoreflexic in the upper extremities. No Hoffmann sign.  Neurological: She is alert and oriented to person, place, and time.  Skin: Skin is warm and dry.    MRI of the right shoulder: Full-thickness retroactive tears of the rotator cuff of the supraspinatus infraspinatus. Mild glenohumeral arthrosis. Minimal muscular atrophy. No fat atrophy..   Assessment/Plan Patient demonstrates an acute full-thickness  tear retracted of the rotator cuff including infraspinatus and supraspinatus.  Given the absence of any significant atrophy discussed rotator cuff repair  An extensive discussion concerning the pathology relevant anatomy and treatment options. After that discussion we mutually agreed to proceed with repair of the rotator cuff utilizing arthroscopic assistance if possible. The risks and benefits of that procedure were discussed including bleeding, infection, suboptimal range of motion, deep venous thrombosis, pulmonary embolism, anesthetic complications etc. in addition we discussed the postoperative course to include approximately 4 weeks of passive range of motion followed by 4 weeks of active range of motion followed by 4-12 weeks of progressive strengthening exercises. In addition we discussed protective activities to reduce the risk of a reinjury including impingement activities with elbow above the shoulder as well as reaching and repetitive circular motion activities. The hospital stay will either be as a outpatient with a regional block versus overnight depending upon the extent of the procedure and any challenging health issues with a first postoperative visit 2 weeks following the surgery.  Would suggest a mini open a possible patch graft.  She is otherwise healthy. Hypertension.  We will procedure as a outpatient evaluation  Plan right shoulder mini-open RCR, SAD, possible patch graft  Cecilie Kicks, PA-C for Dr. Tonita Cong 05/05/2019 4:32PM

## 2019-05-05 NOTE — Progress Notes (Signed)
Please place orders in Epic as patient is being scheduled for a pre-op appointment! Thank you! 

## 2019-05-05 NOTE — H&P (Signed)
Julie Rush is an 68 y.o. female.   Chief Complaint: Right shoulder pain HPI: Reason for Visit: Diagnositc Results (right shoulder MRI) Context: The patient is 4 months out from a fall Location (Upper Extremity): shoulder pain on the right Severity: pain level 8/10 Timing: come and go Aggravating Factors: ROM Medications: The patient is taking Aleve and Tylenol She is right-hand dominant difficult raising her arm up.      Past Medical History:  Diagnosis Date  . Anginal pain ([place])   . Arthritis    "knees, back, fingers, shoulders, neck"  . Chronic lower back pain   . Depression 06/08/12   "related to not getting definitive dx of what's wrong"  . Exertional dyspnea   . GERD (gastroesophageal reflux disease)   . H/O hiatal hernia   . Headache(784.0)   . History of stomach ulcers 1980's  . Hypertension   . Migraines    4-5x/yr  . Pneumonia   . PONV (postoperative nausea and vomiting)   . Sleep apnea 2013   "dx'd borderline"  . Type II diabetes mellitus ([place]) 2013   "borderline"  . Vertigo    last episode over 6 mos ago         Past Surgical History:  Procedure Laterality Date  . APPENDECTOMY  1960's  . BREAST BIOPSY     right  . CATARACT EXTRACTION W/PHACO Left 11/01/2018   Procedure: CATARACT EXTRACTION PHACO AND INTRAOCULAR LENS PLACEMENT ([place]) LEFT;  Surgeon: Eulogio Bear, MD;  Location: [place];  Service: Ophthalmology;  Laterality: Left;  . CATARACT EXTRACTION W/PHACO Right 11/29/2018   Procedure: CATARACT EXTRACTION PHACO AND INTRAOCULAR LENS PLACEMENT ([place]) RIGHT;  Surgeon: Eulogio Bear, MD;  Location: [place];  Service: Ophthalmology;  Laterality: Right;  Diabetes - diet conrolled sleep apnea  . CHOLECYSTECTOMY  2006  . EXPLORATORY LAPAROTOMY  late 1990's   "blocked colon S/P hernia repair"  . HERNIA REPAIR     "@ least 3 abdominal hernia repairs"  . [place]  .  VAGINAL HYSTERECTOMY  1981         Family History  Problem Relation Age of Onset  . Arthritis Mother   . Cancer Father        prostate  . Hyperlipidemia Father   . Alcohol abuse Father   . Arthritis Father   . Heart disease Father   . Stroke Father   . Hypertension Father   . Diabetes Father   . Cancer Sister        ovary  . Arthritis Sister   . Cancer Brother        thyroid  . Arthritis Brother   . Cancer Sister        breast  . Arthritis Sister   . Breast cancer Sister 49  . Arthritis Maternal Grandmother   . Arthritis Maternal Grandfather    Social History:  reports that she quit smoking about 43 years ago. She has a 28.00 pack-year smoking history. She has never used smokeless tobacco. She reports that she does not drink alcohol or use drugs.  Allergies:      Allergies  Allergen Reactions  . Codeine Nausea And Vomiting  . Prednisone Palpitations  . Sulfa Antibiotics Itching and Rash  . Trimethoprim Hives   Medications: Aleve 220 mg capsule buPROPion HCL SR 150 mg tablet,12 hr sustained-release methocarbamoL 500 mg tablet metoprolol succinate ER 50 mg tablet,extended release 24 hr PriLOSEC triamcinolone acetonide 0.1 %  topical cream Tylenol Extra Strength Ventolin HFA 90 mcg/actuation aerosol inhaler  Review of Systems  Constitutional: Negative.   HENT: Negative.   Eyes: Negative.   Respiratory: Negative.   Cardiovascular: Negative.   Gastrointestinal: Negative.   Genitourinary: Negative.   Musculoskeletal: Positive for joint pain.  Skin: Negative.   Neurological: Negative.   Psychiatric/Behavioral: Negative.     There were no vitals taken for this visit. Physical Exam  Constitutional: She is oriented to person, place, and time. She appears well-developed and well-nourished.  HENT:  Head: Normocephalic.  Eyes: Pupils are equal, round, and reactive to light.  Neck: Normal range of motion.  Cardiovascular: Normal  rate.  Respiratory: Effort normal.  GI: Soft. Bowel sounds are normal.  Musculoskeletal:     Comments: Patient is a 68 year old female.  Constitutional: General Appearance: healthy-appearing and NAD.  Psychiatric: Mood and Affect: normal mood and affect.  Cardiovascular System: Arterial Pulses Right: radial normal and brachial normal. Varicosities Right: no varicosities.  C-Spine/Neck: Active Range of Motion: flexion normal, extension normal, and no pain elicited on motion.  Shoulders: Inspection Right: no misalignment, atrophy, erythema, swelling, or scapular winging. Bony Palpation Right: no tenderness of the sternoclavicular joint, the coracoid process, the acromioclavicular joint, the bicipital groove, or the scapula. Soft Tissue Palpation Right: tenderness of the supraspinatus and the subacromial bursa. Active Range of Motion Right: limited. Special Tests Right: Speed's test negative and Neer's test positive. Stability Right: no laxity, sulcus sign negative, and anterior apprehension test negative. Strength Right: abduction 5/5, adduction 5/5, flexion 5/5, and extension 5/5.  Skin: Right Upper Extremity: normal.  Neurological System: Biceps Reflex Right: normal (2). Brachioradialis Reflex Right: normal (2). Triceps Reflex Right: normal (2). Sensation on the Right: C5 normal, C6 normal, and C7 normal.  Normal cervical lordosis. No pain with range of motion. No palpable tenderness. Motor is 5/5 in all groups in the upper extremities. Upper extremity sensory exam normal. Patient is normoreflexic in the upper extremities. No Hoffmann sign.  Neurological: She is alert and oriented to person, place, and time.  Skin: Skin is warm and dry.    MRI of the right shoulder: Full-thickness retroactive tears of the rotator cuff of the supraspinatus infraspinatus. Mild glenohumeral arthrosis. Minimal muscular atrophy. No fat atrophy..   Assessment/Plan Patient demonstrates an acute full-thickness  tear retracted of the rotator cuff including infraspinatus and supraspinatus.  Given the absence of any significant atrophy discussed rotator cuff repair  An extensive discussion concerning the pathology relevant anatomy and treatment options. After that discussion we mutually agreed to proceed with repair of the rotator cuff utilizing arthroscopic assistance if possible. The risks and benefits of that procedure were discussed including bleeding, infection, suboptimal range of motion, deep venous thrombosis, pulmonary embolism, anesthetic complications etc. in addition we discussed the postoperative course to include approximately 4 weeks of passive range of motion followed by 4 weeks of active range of motion followed by 4-12 weeks of progressive strengthening exercises. In addition we discussed protective activities to reduce the risk of a reinjury including impingement activities with elbow above the shoulder as well as reaching and repetitive circular motion activities. The hospital stay will either be as a outpatient with a regional block versus overnight depending upon the extent of the procedure and any challenging health issues with a first postoperative visit 2 weeks following the surgery.  Would suggest a mini open a possible patch graft.  She is otherwise healthy. Hypertension.  We will procedure as a outpatient evaluation  Plan right shoulder mini-open RCR, SAD, possible patch graft  Cecilie Kicks, PA-C for Dr. Tonita Cong 05/05/2019 4:32PM

## 2019-05-18 ENCOUNTER — Ambulatory Visit: Payer: Self-pay | Admitting: Physician Assistant

## 2019-05-20 ENCOUNTER — Encounter (HOSPITAL_COMMUNITY): Payer: Self-pay

## 2019-05-20 ENCOUNTER — Encounter (HOSPITAL_COMMUNITY)
Admission: RE | Admit: 2019-05-20 | Discharge: 2019-05-20 | Disposition: A | Discharge status: Home / Self Care | Payer: Medicare Other | Source: Ambulatory Visit | Attending: Specialist | Admitting: Specialist

## 2019-05-20 ENCOUNTER — Other Ambulatory Visit: Payer: Self-pay

## 2019-05-20 DIAGNOSIS — Z1159 Encounter for screening for other viral diseases: Secondary | ICD-10-CM | POA: Diagnosis not present

## 2019-05-20 DIAGNOSIS — M75101 Unspecified rotator cuff tear or rupture of right shoulder, not specified as traumatic: Secondary | ICD-10-CM | POA: Insufficient documentation

## 2019-05-20 DIAGNOSIS — Z01812 Encounter for preprocedural laboratory examination: Secondary | ICD-10-CM | POA: Diagnosis present

## 2019-05-20 HISTORY — DX: Other asthma: J45.998

## 2019-05-20 LAB — BASIC METABOLIC PANEL
Anion gap: 6 (ref 5–15)
BUN: 29 mg/dL — ABNORMAL HIGH (ref 8–23)
CO2: 25 mmol/L (ref 22–32)
Calcium: 9.2 mg/dL (ref 8.9–10.3)
Chloride: 105 mmol/L (ref 98–111)
Creatinine, Ser: 1.15 mg/dL — ABNORMAL HIGH (ref 0.44–1.00)
GFR calc Af Amer: 56 mL/min — ABNORMAL LOW (ref >60)
GFR calc non Af Amer: 49 mL/min — ABNORMAL LOW (ref >60)
Glucose, Bld: 98 mg/dL (ref 70–99)
Potassium: 4.6 mmol/L (ref 3.5–5.1)
Sodium: 136 mmol/L (ref 135–145)

## 2019-05-20 LAB — CBC
HCT: 37.4 % (ref 36.0–46.0)
Hemoglobin: 11.4 g/dL — ABNORMAL LOW (ref 12.0–15.0)
MCH: 29.5 pg (ref 26.0–34.0)
MCHC: 30.5 g/dL (ref 30.0–36.0)
MCV: 96.9 fL (ref 80.0–100.0)
Platelets: 307 10*3/uL (ref 150–400)
RBC: 3.86 MIL/uL — ABNORMAL LOW (ref 3.87–5.11)
RDW: 13 % (ref 11.5–15.5)
WBC: 8.4 10*3/uL (ref 4.0–10.5)
nRBC: 0 % (ref 0.0–0.2)

## 2019-05-20 LAB — HEMOGLOBIN A1C
Hgb A1c MFr Bld: 5.6 % (ref 4.8–5.6)
Mean Plasma Glucose: 114.02 mg/dL

## 2019-05-20 LAB — GLUCOSE, CAPILLARY: Glucose-Capillary: 92 mg/dL (ref 70–99)

## 2019-05-20 NOTE — Progress Notes (Addendum)
Cardiac clearance requested from [facility] DR.FATH 03-04-2019 ON CHART    LOV DR. FATH 02-22-2019  STRESS TEST 02-28-2019  PATIENT DISPOSITION AT PRE-OP: VSS. MILDLY SOB ---ONGOING HX OF EXERTIONAL DYSPNEA. DENIES ACUTE CARDIAC SYMPTOMS TODAY.

## 2019-05-20 NOTE — Patient Instructions (Signed)
YOU ARE REQUIRED TO BE TESTED FOR COVID-19 PRIOR TO YOUR SURGERY . YOUR TEST MUST BE COMPLETED ON Monday June 1st,  2020. TESTING IS LOCATED AT [facility] FROM 9:00AM - 3:00PM. FAILURE TO COMPLETE TESTING MAY RESULT IN CANCELLATION OF YOUR SURGERY.                 Julie Rush   Your procedure is scheduled on: 05-25-2019   Report to [facility] Main  Entrance     Report to admitting at 6:30AM     Call this number if you have problems the morning of surgery [phone]      Remember: Do not eat food After Midnight. YOU MAY HAVE CLEAR LIQUIDS FROM MIDNIGHT UNTIL 4:30AM. At 4:30AM Please finish the prescribed Pre-Surgery Gatorade drink. Nothing by mouth after you finish the Gatorade drink !     CLEAR LIQUID DIET   Foods Allowed                                                                     Foods Excluded  Coffee and tea, regular and decaf                             liquids that you cannot  Plain Jell-O in any flavor                                             see through such as: Fruit ices (not with fruit pulp)                                     milk, soups, orange juice  Iced Popsicles                                    All solid food Carbonated beverages, regular and diet                                    Cranberry, grape and apple juices Sports drinks like Gatorade Lightly seasoned clear broth or consume(fat free) Sugar, honey syrup  Sample Menu Breakfast                                Lunch                                     Supper Cranberry juice                    Beef broth                            Chicken broth Jell-O  Grape juice                           Apple juice Coffee or tea                        Jell-O                                      Popsicle                                                Coffee or tea                        Coffee or  tea  _____________________________________________________________________    BRUSH YOUR TEETH MORNING OF SURGERY AND RINSE YOUR MOUTH OUT, NO CHEWING GUM CANDY OR MINTS.     Take these medicines the morning of surgery with A SIP OF WATER: METOPROLOL, BUPROPION, ALBUTEROL INHALER IF NEEDED                                 You may not have any metal on your body including hair pins and              piercings  Do not wear jewelry, make-up, lotions, powders or perfumes, deodorant             Do not wear nail polish.  Do not shave  48 hours prior to surgery.              Do not bring valuables to the hospital. [place].  Contacts, dentures or bridgework may not be worn into surgery.                Please read over the following fact sheets you were given: _____________________________________________________________________   [facility] - Preparing for Surgery Before surgery, you can play an important role.  Because skin is not sterile, your skin needs to be as free of germs as possible.  You can reduce the number of germs on your skin by washing with CHG (chlorahexidine gluconate) soap before surgery.  CHG is an antiseptic cleaner which kills germs and bonds with the skin to continue killing germs even after washing. Please DO NOT use if you have an allergy to CHG or antibacterial soaps.  If your skin becomes reddened/irritated stop using the CHG and inform your nurse when you arrive at Short Stay. Do not shave (including legs and underarms) for at least 48 hours prior to the first CHG shower.  You may shave your face/neck. Please follow these instructions carefully:  1.  Shower with CHG Soap the night before surgery and the  morning of Surgery.  2.  If you choose to wash your hair, wash your hair first as usual with your  normal  shampoo.  3.  After you shampoo, rinse your hair and body thoroughly to remove the  shampoo.  4.  Use CHG as you would any other liquid soap.  You can apply chg directly  to the skin and wash                       Gently with a scrungie or clean washcloth.  5.  Apply the CHG Soap to your body ONLY FROM THE NECK DOWN.   Do not use on face/ open                           Wound or open sores. Avoid contact with eyes, ears mouth and genitals (private parts).                       Wash face,  Genitals (private parts) with your normal soap.             6.  Wash thoroughly, paying special attention to the area where your surgery  will be performed.  7.  Thoroughly rinse your body with warm water from the neck down.  8.  DO NOT shower/wash with your normal soap after using and rinsing off  the CHG Soap.                9.  Pat yourself dry with a clean towel.            10.  Wear clean pajamas.            11.  Place clean sheets on your bed the night of your first shower and do not  sleep with pets. Day of Surgery : Do not apply any lotions/deodorants the morning of surgery.  Please wear clean clothes to the hospital/surgery center.  FAILURE TO FOLLOW THESE INSTRUCTIONS MAY RESULT IN THE CANCELLATION OF YOUR SURGERY PATIENT SIGNATURE_________________________________  NURSE SIGNATURE__________________________________  ________________________________________________________________________   Julie Rush  An incentive spirometer is a tool that can help keep your lungs clear and active. This tool measures how well you are filling your lungs with each breath. Taking long deep breaths may help reverse or decrease the chance of developing breathing (pulmonary) problems (especially infection) following:  A long period of time when you are unable to move or be active. BEFORE THE PROCEDURE   If the spirometer includes an indicator to show your best effort, your nurse or respiratory therapist will set it to a desired goal.  If possible, sit up straight or lean slightly  forward. Try not to slouch.  Hold the incentive spirometer in an upright position. INSTRUCTIONS FOR USE  1. Sit on the edge of your bed if possible, or sit up as far as you can in bed or on a chair. 2. Hold the incentive spirometer in an upright position. 3. Breathe out normally. 4. Place the mouthpiece in your mouth and seal your lips tightly around it. 5. Breathe in slowly and as deeply as possible, raising the piston or the ball toward the top of the column. 6. Hold your breath for 3-5 seconds or for as long as possible. Allow the piston or ball to fall to the bottom of the column. 7. Remove the mouthpiece from your mouth and breathe out normally. 8. Rest for a few seconds and repeat Steps 1 through 7 at least 10 times every 1-2 hours when you are awake. Take your time and take a few normal breaths between deep breaths. 9. The spirometer may include an indicator to show  your best effort. Use the indicator as a goal to work toward during each repetition. 10. After each set of 10 deep breaths, practice coughing to be sure your lungs are clear. If you have an incision (the cut made at the time of surgery), support your incision when coughing by placing a pillow or rolled up towels firmly against it. Once you are able to get out of bed, walk around indoors and cough well. You may stop using the incentive spirometer when instructed by your caregiver.  RISKS AND COMPLICATIONS  Take your time so you do not get dizzy or light-headed.  If you are in pain, you may need to take or ask for pain medication before doing incentive spirometry. It is harder to take a deep breath if you are having pain. AFTER USE  Rest and breathe slowly and easily.  It can be helpful to keep track of a log of your progress. Your caregiver can provide you with a simple table to help with this. If you are using the spirometer at home, follow these instructions: [place] IF:   You are having difficultly using the  spirometer.  You have trouble using the spirometer as often as instructed.  Your pain medication is not giving enough relief while using the spirometer.  You develop fever of 100.5 F (38.1 C) or higher. SEEK IMMEDIATE MEDICAL CARE IF:   You cough up bloody sputum that had not been present before.  You develop fever of 102 F (38.9 C) or greater.  You develop worsening pain at or near the incision site. MAKE SURE YOU:   Understand these instructions.  Will watch your condition.  Will get help right away if you are not doing well or get worse. Document Released: 04/20/2007 Document Revised: 03/01/2012 Document Reviewed: 06/21/2007 [facility] Patient Information 2014 [place], [place].   ________________________________________________________________________

## 2019-05-23 ENCOUNTER — Other Ambulatory Visit: Payer: Self-pay

## 2019-05-23 ENCOUNTER — Other Ambulatory Visit (HOSPITAL_COMMUNITY)
Admission: RE | Admit: 2019-05-23 | Discharge: 2019-05-23 | Disposition: A | Discharge status: Home / Self Care | Payer: Medicare Other | Source: Ambulatory Visit | Attending: Specialist | Admitting: Specialist

## 2019-05-23 DIAGNOSIS — Z01812 Encounter for preprocedural laboratory examination: Secondary | ICD-10-CM | POA: Diagnosis not present

## 2019-05-23 LAB — SARS CORONAVIRUS 2 BY RT PCR (HOSPITAL ORDER, PERFORMED IN ~~LOC~~ HOSPITAL LAB): SARS Coronavirus 2: NEGATIVE

## 2019-05-23 NOTE — Anesthesia Preprocedure Evaluation (Addendum)
Anesthesia Evaluation  Patient identified by MRN, date of birth, ID band Patient awake    Reviewed: Allergy & Precautions, NPO status , Patient's Chart, lab work & pertinent test results, reviewed documented beta blocker date and time   History of Anesthesia Complications (+) PONV  Airway Mallampati: II  TM Distance: >3 FB Neck ROM: Full  Mouth opening: Limited Mouth Opening  Dental no notable dental hx. (+) Chipped, Dental Advisory Given,    Pulmonary sleep apnea , former smoker,    Pulmonary exam normal breath sounds clear to auscultation       Cardiovascular hypertension, Pt. on home beta blockers and Pt. on medications Normal cardiovascular exam Rhythm:Regular Rate:Normal  Echo 02/09/2017 Conclusion:  1. Left ventricle cavity is normal in size.  Mild concentric hypertrophy of the left ventricle.  Mild decrease in global wall motion.  Doppler evidence of grade 1 (impaired) diastolic dysfunction, normal LAP.  Left ventricle regional wall motion findings: No wall motion abnormalities.   Calculated EF 53% 2. Mild (Grade I) mitral regurgitation 3. Trace tricuspid regurgitation.  Unable to estimate PA pressure due to absence/minimal TR signal.  4. No significant change since 07/28/2011  Myocardial Perfusion 02/09/17 Impression:  1. The resting electrocardiogram demonstrated normal sinus rhythm, IVCD LAD, LVH and no resting arrhythmias.  Stress EKG is non-diagnostic for ischemia as it is a pharmacologic stress using Lexiscan.  Stress symptoms included dyspnea.   2. Review of the raw data in a rotational format reveals breast attenuation in the inferior wall (scanned sitting).  There is no demonstrable ischemia or scar.  LV systolic function was normal at 68% with normal endocardial thickening in all vascular territories   Neuro/Psych PSYCHIATRIC DISORDERS Anxiety Depression negative neurological ROS     GI/Hepatic Neg liver ROS,  hiatal hernia, GERD  ,  Endo/Other  diabetes, Type 2Morbid obesity  Renal/GU negative Renal ROS  negative genitourinary   Musculoskeletal  (+) Arthritis ,   Abdominal   Peds negative pediatric ROS (+)  Hematology negative hematology ROS (+)   Anesthesia Other Findings   Reproductive/Obstetrics negative OB ROS                           Anesthesia Physical Anesthesia Plan  ASA: III  Anesthesia Plan: General and Regional   Post-op Pain Management:  Regional for Post-op pain   Induction: Intravenous  PONV Risk Score and Plan: 4 or greater and Ondansetron, Dexamethasone, Treatment may vary due to age or medical condition, Midazolam and Scopolamine patch - Pre-op  Airway Management Planned: Oral ETT  Additional Equipment:   Intra-op Plan:   Post-operative Plan: Extubation in OR  Informed Consent: I have reviewed the patients History and Physical, chart, labs and discussed the procedure including the risks, benefits and alternatives for the proposed anesthesia with the patient or authorized representative who has indicated his/her understanding and acceptance.     Dental advisory given  Plan Discussed with: CRNA  Anesthesia Plan Comments: (See PAT note 05/20/2019, Konrad Felix, PA-C)      Anesthesia Quick Evaluation

## 2019-05-23 NOTE — Progress Notes (Signed)
Anesthesia Chart Review   Case:  [id number] Date/Time:  05/25/19 0815   Procedure:  Right shoulder mini open rotator cuff repair, subacromial decompression, poss patch graft (Right ) - 90 mins   Anesthesia type:  General   Pre-op diagnosis:  Right shoulder rotator cuff tear   Location:  [facility] ROOM 05 / [facility]   Surgeon:  Susa Day, MD      DISCUSSION:67 yo former smoker (28 pack years, quit 11/08/75) with h/o PONV, GERD, sleep apnea, depression, migraines, asthma, HTN, DM II, vertigo, right shoulder rotator cuff tear scheduled for above procedure 05/25/2019 with Dr. Susa Day.   Pt last seen by cardiologist, Dr. Bartholome Bill, 02/22/2019.  Clearance on chart which states that pt is low risk from a cardiac standpoint.   Anticipate pt can proceed with planned procedure barring acute status change.  VS: BP (!) 141/79   Pulse 77   Temp 36.7 C (Oral)   Resp 18   Ht 5\' 5"  (1.651 m)   Wt 119 kg   SpO2 100%   BMI 43.64 kg/m   PROVIDERS: Burnard Hawthorne, FNP is PCP   Bartholome Bill, MD is Cardiologist  LABS: Labs reviewed: Acceptable for surgery. (all labs ordered are listed, but only abnormal results are displayed)  Labs Reviewed  BASIC METABOLIC PANEL - Abnormal; Notable for the following components:      Result Value   BUN 29 (*)    Creatinine, Ser 1.15 (*)    GFR calc non Af Amer 49 (*)    GFR calc Af Amer 56 (*)    All other components within normal limits  CBC - Abnormal; Notable for the following components:   RBC 3.86 (*)    Hemoglobin 11.4 (*)    All other components within normal limits  GLUCOSE, CAPILLARY  HEMOGLOBIN A1C     IMAGES:   EKG: 02/11/2019 Rate 65 bpm Sinus rhythm  Left axis anterior fascicular block  Voltage criteria for lVH Poor R wave progression-nonspecific-consider old anterior infarct.   CV: Echo 02/09/2017 Conclusion:  1. Left ventricle cavity is normal in size.  Mild concentric hypertrophy of the left ventricle.  Mild decrease in  global wall motion.  Doppler evidence of grade 1 (impaired) diastolic dysfunction, normal LAP.  Left ventricle regional wall motion findings: No wall motion abnormalities.   Calculated EF 53% 2. Mild (Grade I) mitral regurgitation 3. Trace tricuspid regurgitation.  Unable to estimate PA pressure due to absence/minimal TR signal.  4. No significant change since 07/28/2011  Myocardial Perfusion 02/09/17 Impression:  1. The resting electrocardiogram demonstrated normal sinus rhythm, IVCD LAD, LVH and no resting arrhythmias.  Stress EKG is non-diagnostic for ischemia as it is a pharmacologic stress using Lexiscan.  Stress symptoms included dyspnea.   2. Review of the raw data in a rotational format reveals breast attenuation in the inferior wall (scanned sitting).  There is no demonstrable ischemia or scar.  LV systolic function was normal at 68% with normal endocardial thickening in all vascular territories.   Past Medical History:  Diagnosis Date  . Anginal pain ([place])   . Arthritis    "knees, back, fingers, shoulders, neck"  . Chronic lower back pain   . Depression 06/08/12   "related to not getting definitive dx of what's wrong"  . Exertional dyspnea   . GERD (gastroesophageal reflux disease)   . H/O hiatal hernia   . Headache(784.0)   . History of stomach ulcers 1980's  . Hypertension   .  Migraines    4-5x/yr  . Pneumonia   . PONV (postoperative nausea and vomiting)   . Seasonal asthma   . Sleep apnea 2013   "dx'd borderline"  . Type II diabetes mellitus ([place]) 2013   "borderline"  . Vertigo    last episode over 6 mos ago    Past Surgical History:  Procedure Laterality Date  . APPENDECTOMY  1960's  . BREAST BIOPSY     right  . CATARACT EXTRACTION W/PHACO Left 11/01/2018   Procedure: CATARACT EXTRACTION PHACO AND INTRAOCULAR LENS PLACEMENT ([place]) LEFT;  Surgeon: Eulogio Bear, MD;  Location: [facility];  Service: Ophthalmology;  Laterality: Left;  . CATARACT  EXTRACTION W/PHACO Right 11/29/2018   Procedure: CATARACT EXTRACTION PHACO AND INTRAOCULAR LENS PLACEMENT ([place]) RIGHT;  Surgeon: Eulogio Bear, MD;  Location: [facility];  Service: Ophthalmology;  Laterality: Right;  Diabetes - diet conrolled sleep apnea  . CHOLECYSTECTOMY  2006  . EXPLORATORY LAPAROTOMY  late 1990's   "blocked colon S/P hernia repair"  . HERNIA REPAIR     "@ least 3 abdominal hernia repairs"  . [place]  . VAGINAL HYSTERECTOMY  1981    MEDICATIONS: . acetaminophen (TYLENOL) 500 MG tablet  . albuterol (PROVENTIL HFA) 108 (90 Base) MCG/ACT inhaler  . aspirin EC 81 MG tablet  . b complex vitamins tablet  . buPROPion (WELLBUTRIN SR) 150 MG 12 hr tablet  . ELDERBERRY PO  . methocarbamol (ROBAXIN) 500 MG tablet  . metoprolol succinate (TOPROL-XL) 50 MG 24 hr tablet  . naproxen sodium (ALEVE) 220 MG tablet  . omeprazole (PRILOSEC) 20 MG capsule  . triamcinolone cream (KENALOG) 0.1 %   No current facility-administered medications for this encounter.     Konrad Felix, PA-C  Pre-Surgical Testing [phone] 05/23/19 1:29 PM

## 2019-05-24 NOTE — Progress Notes (Signed)
SPOKE WITH Carnelia Maisano    SCREENING SYMPTOMS OF COVID 19:   COUGH--NO  RUNNY NOSE--- NO  SORE THROAT---NO  NASAL CONGESTION----NO  SNEEZING----NO  SHORTNESS OF BREATH---NO  DIFFICULTY BREATHING---NO  TEMP >100.0 -----NO  UNEXPLAINED BODY ACHES------NO  CHILLS -------- NO  HEADACHES ---------NO  LOSS OF SMELL/ TASTE --------NO    HAVE YOU OR ANY FAMILY MEMBER TRAVELLED PAST 14 DAYS OUT OF THE   COUNTY---NO STATE----NO COUNTRY----NO  HAVE YOU OR ANY FAMILY MEMBER BEEN EXPOSED TO ANYONE WITH COVID 19? NO

## 2019-05-25 ENCOUNTER — Encounter (HOSPITAL_COMMUNITY)
Admission: RE | Disposition: A | Discharge status: Home / Self Care | Payer: Self-pay | Source: Other Acute Inpatient Hospital | Attending: Specialist

## 2019-05-25 ENCOUNTER — Ambulatory Visit (HOSPITAL_COMMUNITY): Payer: Medicare Other | Admitting: Physician Assistant

## 2019-05-25 ENCOUNTER — Ambulatory Visit (HOSPITAL_COMMUNITY): Payer: Medicare Other | Admitting: Anesthesiology

## 2019-05-25 ENCOUNTER — Ambulatory Visit (HOSPITAL_COMMUNITY)
Admission: RE | Admit: 2019-05-25 | Discharge: 2019-05-26 | Disposition: A | Discharge status: Home / Self Care | Payer: Medicare Other | Source: Other Acute Inpatient Hospital | Attending: Specialist | Admitting: Specialist

## 2019-05-25 ENCOUNTER — Other Ambulatory Visit: Payer: Self-pay

## 2019-05-25 ENCOUNTER — Encounter (HOSPITAL_COMMUNITY): Payer: Self-pay | Admitting: General Practice

## 2019-05-25 DIAGNOSIS — M19041 Primary osteoarthritis, right hand: Secondary | ICD-10-CM | POA: Diagnosis not present

## 2019-05-25 DIAGNOSIS — M170 Bilateral primary osteoarthritis of knee: Secondary | ICD-10-CM | POA: Insufficient documentation

## 2019-05-25 DIAGNOSIS — M19042 Primary osteoarthritis, left hand: Secondary | ICD-10-CM | POA: Diagnosis not present

## 2019-05-25 DIAGNOSIS — G4730 Sleep apnea, unspecified: Secondary | ICD-10-CM | POA: Diagnosis not present

## 2019-05-25 DIAGNOSIS — M19011 Primary osteoarthritis, right shoulder: Secondary | ICD-10-CM | POA: Diagnosis not present

## 2019-05-25 DIAGNOSIS — M549 Dorsalgia, unspecified: Secondary | ICD-10-CM | POA: Diagnosis not present

## 2019-05-25 DIAGNOSIS — Z79899 Other long term (current) drug therapy: Secondary | ICD-10-CM | POA: Insufficient documentation

## 2019-05-25 DIAGNOSIS — F329 Major depressive disorder, single episode, unspecified: Secondary | ICD-10-CM | POA: Insufficient documentation

## 2019-05-25 DIAGNOSIS — G43909 Migraine, unspecified, not intractable, without status migrainosus: Secondary | ICD-10-CM | POA: Insufficient documentation

## 2019-05-25 DIAGNOSIS — E669 Obesity, unspecified: Secondary | ICD-10-CM | POA: Insufficient documentation

## 2019-05-25 DIAGNOSIS — I10 Essential (primary) hypertension: Secondary | ICD-10-CM | POA: Insufficient documentation

## 2019-05-25 DIAGNOSIS — M75120 Complete rotator cuff tear or rupture of unspecified shoulder, not specified as traumatic: Secondary | ICD-10-CM | POA: Diagnosis present

## 2019-05-25 DIAGNOSIS — E119 Type 2 diabetes mellitus without complications: Secondary | ICD-10-CM | POA: Insufficient documentation

## 2019-05-25 DIAGNOSIS — M19012 Primary osteoarthritis, left shoulder: Secondary | ICD-10-CM | POA: Diagnosis not present

## 2019-05-25 DIAGNOSIS — Z87891 Personal history of nicotine dependence: Secondary | ICD-10-CM | POA: Diagnosis not present

## 2019-05-25 DIAGNOSIS — K219 Gastro-esophageal reflux disease without esophagitis: Secondary | ICD-10-CM | POA: Diagnosis not present

## 2019-05-25 DIAGNOSIS — Z6841 Body Mass Index (BMI) 40.0 and over, adult: Secondary | ICD-10-CM | POA: Insufficient documentation

## 2019-05-25 DIAGNOSIS — M75121 Complete rotator cuff tear or rupture of right shoulder, not specified as traumatic: Secondary | ICD-10-CM | POA: Diagnosis present

## 2019-05-25 DIAGNOSIS — M47819 Spondylosis without myelopathy or radiculopathy, site unspecified: Secondary | ICD-10-CM | POA: Insufficient documentation

## 2019-05-25 HISTORY — PX: SHOULDER ARTHROSCOPY WITH ROTATOR CUFF REPAIR AND SUBACROMIAL DECOMPRESSION: SHX5686

## 2019-05-25 LAB — GLUCOSE, CAPILLARY: Glucose-Capillary: 89 mg/dL (ref 70–99)

## 2019-05-25 SURGERY — SHOULDER ARTHROSCOPY WITH ROTATOR CUFF REPAIR AND SUBACROMIAL DECOMPRESSION
Anesthesia: Regional | Site: Shoulder | Laterality: Right

## 2019-05-25 MED ORDER — SODIUM CHLORIDE 0.9 % IR SOLN
Status: DC | PRN
  Administered 2019-05-25: 1000 mL

## 2019-05-25 MED ORDER — KCL IN DEXTROSE-NACL 20-5-0.45 MEQ/L-%-% IV SOLN
INTRAVENOUS | Status: DC
  Administered 2019-05-25: 13:00:00 via INTRAVENOUS
  Filled 2019-05-25 (×2): qty 1000

## 2019-05-25 MED ORDER — SODIUM CHLORIDE 0.9 % IV SOLN
INTRAVENOUS | Status: DC | PRN
  Administered 2019-05-25: 40 ug/min via INTRAVENOUS

## 2019-05-25 MED ORDER — LIDOCAINE-EPINEPHRINE 1 %-1:100000 IJ SOLN
INTRAMUSCULAR | Status: AC
  Filled 2019-05-25: qty 1

## 2019-05-25 MED ORDER — POLYETHYLENE GLYCOL 3350 17 G PO PACK: 17.0000 g | PACK | Freq: Every day | ORAL | Status: DC | PRN

## 2019-05-25 MED ORDER — BUPIVACAINE-EPINEPHRINE (PF) 0.25% -1:200000 IJ SOLN
INTRAMUSCULAR | Status: AC
  Filled 2019-05-25: qty 30

## 2019-05-25 MED ORDER — ACETAMINOPHEN 325 MG PO TABS: 325.0000 mg | ORAL_TABLET | Freq: Four times a day (QID) | ORAL | Status: DC | PRN

## 2019-05-25 MED ORDER — ONDANSETRON HCL 4 MG/2ML IJ SOLN
INTRAMUSCULAR | Status: AC
  Filled 2019-05-25: qty 2

## 2019-05-25 MED ORDER — LIDOCAINE 2% (20 MG/ML) 5 ML SYRINGE
INTRAMUSCULAR | Status: DC | PRN
  Administered 2019-05-25: 100 mg via INTRAVENOUS

## 2019-05-25 MED ORDER — ONDANSETRON HCL 4 MG/2ML IJ SOLN
INTRAMUSCULAR | Status: DC | PRN
  Administered 2019-05-25: 4 mg via INTRAVENOUS

## 2019-05-25 MED ORDER — MIDAZOLAM HCL 2 MG/2ML IJ SOLN
INTRAMUSCULAR | Status: AC
  Filled 2019-05-25: qty 2

## 2019-05-25 MED ORDER — FENTANYL CITRATE (PF) 100 MCG/2ML IJ SOLN
INTRAMUSCULAR | Status: AC
  Filled 2019-05-25: qty 2

## 2019-05-25 MED ORDER — CEFAZOLIN SODIUM-DEXTROSE 2-4 GM/100ML-% IV SOLN
2.0000 g | Freq: Four times a day (QID) | INTRAVENOUS | Status: AC
  Administered 2019-05-25 (×2): 2 g via INTRAVENOUS
  Filled 2019-05-25 (×3): qty 100

## 2019-05-25 MED ORDER — DEXAMETHASONE SODIUM PHOSPHATE 10 MG/ML IJ SOLN
INTRAMUSCULAR | Status: AC
  Filled 2019-05-25: qty 1

## 2019-05-25 MED ORDER — OXYCODONE-ACETAMINOPHEN 5-325 MG PO TABS: 1.0000 | ORAL_TABLET | ORAL | 0 refills | Status: AC | PRN

## 2019-05-25 MED ORDER — CHLORHEXIDINE GLUCONATE 4 % EX LIQD: 60.0000 mL | Freq: Once | CUTANEOUS | Status: DC

## 2019-05-25 MED ORDER — DIPHENHYDRAMINE HCL 50 MG/ML IJ SOLN
INTRAMUSCULAR | Status: AC
  Filled 2019-05-25: qty 1

## 2019-05-25 MED ORDER — PROPOFOL 10 MG/ML IV BOLUS
INTRAVENOUS | Status: DC | PRN
  Administered 2019-05-25: 150 mg via INTRAVENOUS

## 2019-05-25 MED ORDER — ONDANSETRON HCL 4 MG PO TABS: 4.0000 mg | ORAL_TABLET | Freq: Four times a day (QID) | ORAL | Status: DC | PRN

## 2019-05-25 MED ORDER — PHENYLEPHRINE 40 MCG/ML (10ML) SYRINGE FOR IV PUSH (FOR BLOOD PRESSURE SUPPORT)
PREFILLED_SYRINGE | INTRAVENOUS | Status: DC | PRN
  Administered 2019-05-25: 80 ug via INTRAVENOUS

## 2019-05-25 MED ORDER — HYDROMORPHONE HCL 1 MG/ML IJ SOLN: 0.5000 mg | INTRAMUSCULAR | Status: DC | PRN

## 2019-05-25 MED ORDER — PHENOL 1.4 % MT LIQD
1.0000 | OROMUCOSAL | Status: DC | PRN
  Filled 2019-05-25: qty 177

## 2019-05-25 MED ORDER — FENTANYL CITRATE (PF) 100 MCG/2ML IJ SOLN: 25.0000 ug | INTRAMUSCULAR | Status: DC | PRN

## 2019-05-25 MED ORDER — BUPIVACAINE-EPINEPHRINE 0.25% -1:200000 IJ SOLN
INTRAMUSCULAR | Status: DC | PRN
  Administered 2019-05-25: 6 mL

## 2019-05-25 MED ORDER — OXYCODONE HCL 5 MG PO TABS
5.0000 mg | ORAL_TABLET | ORAL | Status: DC | PRN
  Administered 2019-05-26: 5 mg via ORAL
  Filled 2019-05-25: qty 1

## 2019-05-25 MED ORDER — METOCLOPRAMIDE HCL 5 MG PO TABS: 5.0000 mg | ORAL_TABLET | Freq: Three times a day (TID) | ORAL | Status: DC | PRN

## 2019-05-25 MED ORDER — DOCUSATE SODIUM 100 MG PO CAPS
100.0000 mg | ORAL_CAPSULE | Freq: Two times a day (BID) | ORAL | Status: DC
  Administered 2019-05-25 – 2019-05-26 (×2): 100 mg via ORAL
  Filled 2019-05-25 (×2): qty 1

## 2019-05-25 MED ORDER — BISACODYL 5 MG PO TBEC: 5.0000 mg | DELAYED_RELEASE_TABLET | Freq: Every day | ORAL | Status: DC | PRN

## 2019-05-25 MED ORDER — ONDANSETRON HCL 4 MG/2ML IJ SOLN: 4.0000 mg | Freq: Four times a day (QID) | INTRAMUSCULAR | Status: DC | PRN

## 2019-05-25 MED ORDER — SUCCINYLCHOLINE CHLORIDE 200 MG/10ML IV SOSY
PREFILLED_SYRINGE | INTRAVENOUS | Status: DC | PRN
  Administered 2019-05-25: 200 mg via INTRAVENOUS

## 2019-05-25 MED ORDER — SUGAMMADEX SODIUM 200 MG/2ML IV SOLN
INTRAVENOUS | Status: DC | PRN
  Administered 2019-05-25: 300 mg via INTRAVENOUS

## 2019-05-25 MED ORDER — PANTOPRAZOLE SODIUM 40 MG PO TBEC
40.0000 mg | DELAYED_RELEASE_TABLET | Freq: Every day | ORAL | Status: DC
  Administered 2019-05-25 – 2019-05-26 (×2): 40 mg via ORAL
  Filled 2019-05-25 (×2): qty 1

## 2019-05-25 MED ORDER — CEFAZOLIN SODIUM-DEXTROSE 2-4 GM/100ML-% IV SOLN
2.0000 g | INTRAVENOUS | Status: AC
  Administered 2019-05-25: 2 g via INTRAVENOUS
  Filled 2019-05-25: qty 100

## 2019-05-25 MED ORDER — SCOPOLAMINE 1 MG/3DAYS TD PT72
MEDICATED_PATCH | TRANSDERMAL | Status: DC | PRN
  Administered 2019-05-25: 1 via TRANSDERMAL

## 2019-05-25 MED ORDER — ACETAMINOPHEN 500 MG PO TABS
500.0000 mg | ORAL_TABLET | Freq: Four times a day (QID) | ORAL | Status: AC
  Administered 2019-05-25 – 2019-05-26 (×4): 500 mg via ORAL
  Filled 2019-05-25 (×4): qty 1

## 2019-05-25 MED ORDER — BUPIVACAINE LIPOSOME 1.3 % IJ SUSP
INTRAMUSCULAR | Status: DC | PRN
  Administered 2019-05-25: 10 mL via PERINEURAL

## 2019-05-25 MED ORDER — SCOPOLAMINE 1 MG/3DAYS TD PT72
MEDICATED_PATCH | TRANSDERMAL | Status: AC
  Filled 2019-05-25: qty 1

## 2019-05-25 MED ORDER — SUCCINYLCHOLINE CHLORIDE 200 MG/10ML IV SOSY
PREFILLED_SYRINGE | INTRAVENOUS | Status: AC
  Filled 2019-05-25: qty 10

## 2019-05-25 MED ORDER — ACETAMINOPHEN 500 MG PO TABS: 1000.0000 mg | ORAL_TABLET | Freq: Four times a day (QID) | ORAL | Status: DC | PRN

## 2019-05-25 MED ORDER — METHOCARBAMOL 500 MG PO TABS: 500.0000 mg | ORAL_TABLET | Freq: Three times a day (TID) | ORAL | 1 refills | Status: DC | PRN

## 2019-05-25 MED ORDER — BUPIVACAINE HCL (PF) 0.5 % IJ SOLN
INTRAMUSCULAR | Status: DC | PRN
  Administered 2019-05-25: 15 mL via PERINEURAL

## 2019-05-25 MED ORDER — METHOCARBAMOL 500 MG PO TABS
500.0000 mg | ORAL_TABLET | Freq: Four times a day (QID) | ORAL | Status: DC | PRN
  Administered 2019-05-25 – 2019-05-26 (×2): 500 mg via ORAL
  Filled 2019-05-25 (×2): qty 1

## 2019-05-25 MED ORDER — METOCLOPRAMIDE HCL 5 MG/ML IJ SOLN: 5.0000 mg | Freq: Three times a day (TID) | INTRAMUSCULAR | Status: DC | PRN

## 2019-05-25 MED ORDER — FENTANYL CITRATE (PF) 100 MCG/2ML IJ SOLN
INTRAMUSCULAR | Status: DC | PRN
  Administered 2019-05-25: 50 ug via INTRAVENOUS
  Administered 2019-05-25 (×2): 25 ug via INTRAVENOUS
  Administered 2019-05-25 (×2): 50 ug via INTRAVENOUS

## 2019-05-25 MED ORDER — ALBUTEROL SULFATE (2.5 MG/3ML) 0.083% IN NEBU: 2.5000 mg | INHALATION_SOLUTION | Freq: Four times a day (QID) | RESPIRATORY_TRACT | Status: DC | PRN

## 2019-05-25 MED ORDER — LIDOCAINE-EPINEPHRINE 1 %-1:100000 IJ SOLN
INTRAMUSCULAR | Status: DC | PRN
  Administered 2019-05-25: 1 mL

## 2019-05-25 MED ORDER — DIPHENHYDRAMINE HCL 12.5 MG/5ML PO ELIX: 12.5000 mg | ORAL_SOLUTION | ORAL | Status: DC | PRN

## 2019-05-25 MED ORDER — LIDOCAINE 2% (20 MG/ML) 5 ML SYRINGE
INTRAMUSCULAR | Status: AC
  Filled 2019-05-25: qty 5

## 2019-05-25 MED ORDER — LACTATED RINGERS IV SOLN
INTRAVENOUS | Status: DC
  Administered 2019-05-25 (×2): via INTRAVENOUS

## 2019-05-25 MED ORDER — PROPOFOL 10 MG/ML IV BOLUS
INTRAVENOUS | Status: AC
  Filled 2019-05-25: qty 20

## 2019-05-25 MED ORDER — EPHEDRINE 5 MG/ML INJ
INTRAVENOUS | Status: AC
  Filled 2019-05-25: qty 10

## 2019-05-25 MED ORDER — BUPROPION HCL ER (SR) 150 MG PO TB12
150.0000 mg | ORAL_TABLET | Freq: Two times a day (BID) | ORAL | Status: DC
  Administered 2019-05-25 – 2019-05-26 (×2): 150 mg via ORAL
  Filled 2019-05-25 (×2): qty 1

## 2019-05-25 MED ORDER — ROCURONIUM BROMIDE 10 MG/ML (PF) SYRINGE
PREFILLED_SYRINGE | INTRAVENOUS | Status: DC | PRN
  Administered 2019-05-25: 40 mg via INTRAVENOUS
  Administered 2019-05-25: 10 mg via INTRAVENOUS

## 2019-05-25 MED ORDER — METHOCARBAMOL 500 MG IVPB - SIMPLE MED
500.0000 mg | Freq: Four times a day (QID) | INTRAVENOUS | Status: DC | PRN
  Filled 2019-05-25: qty 50

## 2019-05-25 MED ORDER — ROCURONIUM BROMIDE 10 MG/ML (PF) SYRINGE
PREFILLED_SYRINGE | INTRAVENOUS | Status: AC
  Filled 2019-05-25: qty 10

## 2019-05-25 MED ORDER — METOPROLOL SUCCINATE ER 50 MG PO TB24
50.0000 mg | ORAL_TABLET | Freq: Every day | ORAL | Status: DC
  Administered 2019-05-26: 50 mg via ORAL
  Filled 2019-05-25: qty 1

## 2019-05-25 MED ORDER — EPHEDRINE SULFATE-NACL 50-0.9 MG/10ML-% IV SOSY
PREFILLED_SYRINGE | INTRAVENOUS | Status: DC | PRN
  Administered 2019-05-25: 10 mg via INTRAVENOUS
  Administered 2019-05-25 (×3): 5 mg via INTRAVENOUS

## 2019-05-25 MED ORDER — MENTHOL 3 MG MT LOZG: 1.0000 | LOZENGE | OROMUCOSAL | Status: DC | PRN

## 2019-05-25 MED ORDER — ALUM & MAG HYDROXIDE-SIMETH 200-200-20 MG/5ML PO SUSP: 30.0000 mL | ORAL | Status: DC | PRN

## 2019-05-25 MED ORDER — MAGNESIUM CITRATE PO SOLN: 1.0000 | Freq: Once | ORAL | Status: DC | PRN

## 2019-05-25 MED ORDER — PHENYLEPHRINE 40 MCG/ML (10ML) SYRINGE FOR IV PUSH (FOR BLOOD PRESSURE SUPPORT)
PREFILLED_SYRINGE | INTRAVENOUS | Status: AC
  Filled 2019-05-25: qty 10

## 2019-05-25 MED ORDER — DIPHENHYDRAMINE HCL 50 MG/ML IJ SOLN
INTRAMUSCULAR | Status: DC | PRN
  Administered 2019-05-25: 12.5 mg via INTRAVENOUS

## 2019-05-25 MED ORDER — ACETAMINOPHEN 500 MG PO TABS
1000.0000 mg | ORAL_TABLET | Freq: Once | ORAL | Status: AC
  Administered 2019-05-25: 1000 mg via ORAL
  Filled 2019-05-25: qty 2

## 2019-05-25 MED ORDER — ASPIRIN EC 81 MG PO TBEC
81.0000 mg | DELAYED_RELEASE_TABLET | Freq: Every day | ORAL | Status: DC
  Administered 2019-05-26: 81 mg via ORAL
  Filled 2019-05-25: qty 1

## 2019-05-25 MED ORDER — PHENYLEPHRINE HCL (PRESSORS) 10 MG/ML IV SOLN
INTRAVENOUS | Status: AC
  Filled 2019-05-25: qty 2

## 2019-05-25 MED ORDER — DEXAMETHASONE SODIUM PHOSPHATE 10 MG/ML IJ SOLN
INTRAMUSCULAR | Status: DC | PRN
  Administered 2019-05-25: 10 mg via INTRAVENOUS

## 2019-05-25 MED ORDER — MIDAZOLAM HCL 5 MG/5ML IJ SOLN
INTRAMUSCULAR | Status: DC | PRN
  Administered 2019-05-25: 2 mg via INTRAVENOUS

## 2019-05-25 SURGICAL SUPPLY — 35 items
ANCHOR NEEDLE 9/16 CIR SZ 8 (NEEDLE) IMPLANT
ANCHOR PEEK 4.75X19.1 SWLK C (Anchor) ×12 IMPLANT
CLOSURE WOUND 1/2 X4 (GAUZE/BANDAGES/DRESSINGS) ×1
COVER SURGICAL LIGHT HANDLE (MISCELLANEOUS) ×3 IMPLANT
DRAPE ORTHO SPLIT 77X108 STRL (DRAPES) ×2
DRAPE SURG ORHT 6 SPLT 77X108 (DRAPES) ×1 IMPLANT
DRSG AQUACEL AG ADV 3.5X 4 (GAUZE/BANDAGES/DRESSINGS) ×3 IMPLANT
DRSG AQUACEL AG ADV 3.5X 6 (GAUZE/BANDAGES/DRESSINGS) IMPLANT
DURAPREP 26ML APPLICATOR (WOUND CARE) ×3 IMPLANT
ELECT NEEDLE TIP 2.8 STRL (NEEDLE) IMPLANT
ELECT REM PT RETURN 15FT ADLT (MISCELLANEOUS) ×3 IMPLANT
FIBER TAPE 2MM (SUTURE) ×3 IMPLANT
GLOVE SURG SS PI 8.0 STRL IVOR (GLOVE) ×6 IMPLANT
GOWN STRL REUS W/TWL XL LVL3 (GOWN DISPOSABLE) ×6 IMPLANT
KIT BASIN OR (CUSTOM PROCEDURE TRAY) ×3 IMPLANT
KIT TURNOVER KIT A (KITS) IMPLANT
MANIFOLD NEPTUNE II (INSTRUMENTS) ×3 IMPLANT
NEEDLE SCORPION MULTI FIRE (NEEDLE) ×3 IMPLANT
PACK SHOULDER (CUSTOM PROCEDURE TRAY) ×3 IMPLANT
PROBE BIPOLAR ATHRO 135MM 90D (MISCELLANEOUS) IMPLANT
SLING ULTRA II L (ORTHOPEDIC SUPPLIES) ×3 IMPLANT
SPONGE LAP 4X18 RFD (DISPOSABLE) ×3 IMPLANT
STRIP CLOSURE SKIN 1/2X4 (GAUZE/BANDAGES/DRESSINGS) ×2 IMPLANT
SUT ETHIBOND NAB CT1 #1 30IN (SUTURE) IMPLANT
SUT FIBERWIRE #2 38 T-5 BLUE (SUTURE)
SUT PROLENE 3 0 PS 2 (SUTURE) ×3 IMPLANT
SUT TIGER TAPE 7 IN WHITE (SUTURE) ×6 IMPLANT
SUT VIC AB 0 CT2 27 (SUTURE) ×3 IMPLANT
SUT VIC AB 1-0 CT2 27 (SUTURE) ×3 IMPLANT
SUT VIC AB 2-0 CT2 27 (SUTURE) ×3 IMPLANT
SUTURE FIBERWR #2 38 T-5 BLUE (SUTURE) IMPLANT
TAPE FIBER 2MM 7IN #2 BLUE (SUTURE) IMPLANT
TOWEL OR 17X26 10 PK STRL BLUE (TOWEL DISPOSABLE) ×3 IMPLANT
TUBING ARTHRO INFLOW-ONLY STRL (TUBING) ×3 IMPLANT
WIPE CHG CHLORHEXIDINE 2% (PERSONAL CARE ITEMS) ×3 IMPLANT

## 2019-05-25 NOTE — Op Note (Signed)
NAME: Julie Rush, Julie Rush MEDICAL RECORD [id number] ACCOUNT [id number] DATE OF BIRTH:[birth year] FACILITY: [facility] LOCATION: [facility] PHYSICIAN:Channah Godeaux Windy Kalata, MD  OPERATIVE REPORT  DATE OF PROCEDURE:  05/25/2019  PREOPERATIVE DIAGNOSIS:  Massive tear of the rotator cuff of the right shoulder, retracted.  POSTOPERATIVE DIAGNOSIS:  Massive tear of the rotator cuff of the right shoulder, retracted, with retraction of the infraspinatus, supraspinatus and a small portion of the subscapularis.  PROCEDURE PERFORMED:  Mini open rotator cuff repair utilizing SwiveLock suture anchors x4 and TigerTape.  ANESTHESIA:  Regional and general.  ASSISTANT:  Brad Dixon  HISTORY:  This is a 68 year old with retracted tear of the rotator cuff.  Traumatic, no atrophy or fatty replacement.  She was indicated for straight repair of the rotator cuff.  The risks and benefits including bleeding, infection, damage to  neurovascular structures, no change in symptoms, worsening symptoms, DVT, PE, anesthetic complications, need for revision, suboptimal range of motion, need for hemiarthroplasty in the future as well as inability to repair.  TECHNIQUE:  With the patient in supine beach-chair position after induction of adequate general anesthesia and 2 g Kefzol, the right shoulder and upper extremity were prepped and draped in the usual sterile fashion.  A surgical marker was utilized to  delineate the acromion, AC joint and coracoid.  A standard 2.5 cm incision was made over the anterolateral aspect of the acromion after infiltration with 0.25% Marcaine with epinephrine.  Subcutaneous tissue was dissected with Cardiolite to achieve  hemostasis.  The rhaphe between anterolateral heads were identified and divided in line with the skin incision.  It was infiltrated with 1% lidocaine for hemostasis.  Electrocautery was utilized to achieve hemostasis.  We had a couple of intramuscular  bleeders.  Following this and incising  the raphae, a small spur off the anterolateral aspect of the acromion was removed with a 3 mm Kerrison.  I digitally lysed subacromial adhesions.  I copiously irrigated the wound.  Inspection revealed massive tear  of the rotator cuff, retracted to the glenohumeral joint.  The glenohumeral joint was lavaged.  There were copious portions of clear synovial fluid that were evacuated from the joint.  I then proceeded to identify and mobilize the rotator cuff, both on  its bursal and articular surface, with a Cobb elevator.  With mobilization, the supraspinatus and a small portion of the subscap as well as the infraspinatus in its entirety were advanced 1.5 cm.  There was a missing portion of the infraspinatus  posteriorly but felt that there was adequate remaining portion of both tendons for coverage.  I then decorticated a footprint lateral to the articular surface with a bare rongeur to good bleeding tissue.  We excised the bursa.  I then put 2 suture  anchors just lateral to the articular surface in the bone, 1 posteriorly and 1 anteriorly, separated by 2 cm and just lateral to the bicipital groove into bone.  The awl was deployed.  I then placed 2 SwiveLocks in these positions with TigerTape  threaded.  We had excellent resistance to pullout after insertion of the anchors.  They were then delivered into the infraspinatus posteriorly with 2 separate passes and the supraspinatus more anteriorly.  They were then advanced.  With the arm at the  side in the neutral position, we put a second row of SwiveLocks, utilizing the awl just over the lateral aspect of the greater tuberosity, again separated 2 awls.  The ends of the TigerTape were then inserted into the SwiveLocks  and without undue tension  secured in a second row fashion.  The tendon pulled down to the bed with full coverage without undue advancement.  Next, redundant suture was removed.  I inspected the entirety of the cuff.  We had excellent coverage  again in a small portion  posteriorly, but the infraspinatus was absent due to the deficiency of the tendon.  I did not feel a graft was necessary.  Next, copiously irrigated with antibiotic irrigation.  Strict hemostasis was achieved.  I repaired the raphe with 1 Vicryl and 0 Vicryl interrupted figure-of-eight sutures, subcu with 2-0, and skin with subcuticular Prolene.  A sterile dressing was  applied, placed in an abduction pillow and sling, extubated and transported to the recovery room in satisfactory condition.  The patient tolerated the procedure well.  No complications.  LN/NUANCE  D:05/25/2019 T:05/25/2019 JOB:[id number]

## 2019-05-25 NOTE — Anesthesia Procedure Notes (Signed)
Procedure Name: Intubation Date/Time: 05/25/2019 8:42 AM Performed by: Silas Sacramento, CRNA Pre-anesthesia Checklist: Patient identified, Emergency Drugs available, Suction available and Patient being monitored Patient Re-evaluated:Patient Re-evaluated prior to induction Oxygen Delivery Method: Circle system utilized Preoxygenation: Pre-oxygenation with 100% oxygen Induction Type: IV induction and Rapid sequence Laryngoscope Size: Mac and 4 Grade View: Grade I Tube type: Oral Tube size: 7.0 mm Number of attempts: 1 Airway Equipment and Method: Stylet Placement Confirmation: ETT inserted through vocal cords under direct vision,  positive ETCO2 and breath sounds checked- equal and bilateral Secured at: 24 cm Tube secured with: Tape Dental Injury: Teeth and Oropharynx as per pre-operative assessment

## 2019-05-25 NOTE — Anesthesia Procedure Notes (Signed)
Anesthesia Regional Block: Interscalene brachial plexus block   Pre-Anesthetic Checklist: ,, timeout performed, Correct Patient, Correct Site, Correct Laterality, Correct Procedure, Correct Position, site marked, Risks and benefits discussed,  Surgical consent,  Pre-op evaluation,  At surgeon's request and post-op pain management  Laterality: Right  Prep: Maximum Sterile Barrier Precautions used, chloraprep       Needles:  Injection technique: Single-shot  Needle Type: Echogenic Stimulator Needle     Needle Length: 5cm  Needle Gauge: 22     Additional Needles:   Procedures:,,,, ultrasound used (permanent image in chart),,,,  Narrative:  Start time: 05/25/2019 8:00 AM End time: 05/25/2019 8:10 AM Injection made incrementally with aspirations every 5 mL.  Performed by: Personally  Anesthesiologist: Freddrick March, MD  Additional Notes: Monitors applied. No increased pain on injection. No increased resistance to injection. Injection made in 5cc increments. Good needle visualization. Patient tolerated procedure well.

## 2019-05-25 NOTE — Anesthesia Postprocedure Evaluation (Signed)
Anesthesia Post Note  Patient: Julie Rush  Procedure(s) Performed: Right shoulder mini open rotator cuff repair, subacromial decompression (Right Shoulder)     Patient location during evaluation: PACU Anesthesia Type: Regional and General Level of consciousness: awake and alert Pain management: pain level controlled Vital Signs Assessment: post-procedure vital signs reviewed and stable Respiratory status: spontaneous breathing, nonlabored ventilation, respiratory function stable and patient connected to nasal cannula oxygen Cardiovascular status: blood pressure returned to baseline and stable Postop Assessment: no apparent nausea or vomiting Anesthetic complications: no    Last Vitals:  Vitals:   05/25/19 1442 05/25/19 1730  BP: 134/61 (!) 149/86  Pulse: 66 67  Resp: 14 18  Temp: 36.4 C 36.4 C  SpO2: 99% 96%    Last Pain:  Vitals:   05/25/19 1800  TempSrc:   PainSc: 3                  Damiah Mcdonald L Alailah Safley

## 2019-05-25 NOTE — Interval H&P Note (Signed)
History and Physical Interval Note:  05/25/2019 8:21 AM  Julie Rush  has presented today for surgery, with the diagnosis of Right shoulder rotator cuff tear.  The various methods of treatment have been discussed with the patient and family. After consideration of risks, benefits and other options for treatment, the patient has consented to  Procedure(s) with comments: Right shoulder mini open rotator cuff repair, subacromial decompression, poss patch graft (Right) - 90 mins as a surgical intervention.  The patient's history has been reviewed, patient examined, no change in status, stable for surgery.  I have reviewed the patient's chart and labs.  Questions were answered to the patient's satisfaction.     Julie Rush

## 2019-05-25 NOTE — Transfer of Care (Signed)
Immediate Anesthesia Transfer of Care Note  Patient: Julie Rush  Procedure(s) Performed: Right shoulder mini open rotator cuff repair, subacromial decompression (Right Shoulder)  Patient Location: PACU  Anesthesia Type:General  Level of Consciousness: awake, oriented and patient cooperative  Airway & Oxygen Therapy: Patient Spontanous Breathing and Patient connected to face mask oxygen  Post-op Assessment: Report given to RN and Post -op Vital signs reviewed and stable  Post vital signs: Reviewed and stable  Last Vitals:  Vitals Value Taken Time  BP 142/80 05/25/2019 10:07 AM  Temp    Pulse 67 05/25/2019 10:12 AM  Resp 16 05/25/2019 10:12 AM  SpO2 100 % 05/25/2019 10:12 AM  Vitals shown include unvalidated device data.  Last Pain:  Vitals:   05/25/19 5726  TempSrc:   PainSc: 0-No pain         Complications: No apparent anesthesia complications

## 2019-05-25 NOTE — Discharge Instructions (Signed)
Aquacel dressing may remain in place until follow up. May shower with aquacel dressing in place. If the dressing becomes saturated or peels off, you may remove it and place a new dressing with gauze and tape which should be kept clean and dry and changed daily. °Use sling at times except when exercising or showering °No driving for 4-6 weeks °No lifting for 6 weeks operative arm °Pendulum exercises as instructed. °Ok to move wrist, elbow, and hand. °See Dr. Candela Krul in 10-14 days. Take one aspirin per day with a meal if not on a blood thinner or allergic to aspirin. °

## 2019-05-25 NOTE — Brief Op Note (Signed)
05/25/2019  9:50 AM  PATIENT:  Julie Rush  68 y.o. female  PRE-OPERATIVE DIAGNOSIS:  Right shoulder rotator cuff tear  POST-OPERATIVE DIAGNOSIS:  Right shoulder rotator cuff tear  PROCEDURE:  Procedure(s) with comments: Right shoulder mini open rotator cuff repair, subacromial decompression (Right) - 90 mins  SURGEON:  Surgeon(s) and Role:    Susa Day, MD - Primary  PHYSICIAN ASSISTANT:   ASSISTANTS: [place]   ANESTHESIA:   general  EBL:  40 mL   BLOOD ADMINISTERED:none  DRAINS: none   LOCAL MEDICATIONS USED:  LIDOCAINE   SPECIMEN:  No Specimen  DISPOSITION OF SPECIMEN:  PATHOLOGY  COUNTS:  YES  TOURNIQUET:  * No tourniquets in log *  DICTATION: .Other Dictation: Dictation Number  [id number]  PLAN OF CARE: Admit for overnight observation  PATIENT DISPOSITION:  PACU - hemodynamically stable.   Delay start of Pharmacological VTE agent (>24hrs) due to surgical blood loss or risk of bleeding: no

## 2019-05-25 NOTE — Interval H&P Note (Signed)
History and Physical Interval Note:  05/25/2019 8:16 AM  Julie Rush  has presented today for surgery, with the diagnosis of Right shoulder rotator cuff tear.  The various methods of treatment have been discussed with the patient and family. After consideration of risks, benefits and other options for treatment, the patient has consented to  Procedure(s) with comments: Right shoulder mini open rotator cuff repair, subacromial decompression, poss patch graft (Right) - 90 mins as a surgical intervention.  The patient's history has been reviewed, patient examined, no change in status, stable for surgery.  I have reviewed the patient's chart and labs.  Questions were answered to the patient's satisfaction.     Johnn Hai

## 2019-05-26 ENCOUNTER — Encounter (HOSPITAL_COMMUNITY): Payer: Self-pay | Admitting: Specialist

## 2019-05-26 DIAGNOSIS — M75121 Complete rotator cuff tear or rupture of right shoulder, not specified as traumatic: Secondary | ICD-10-CM | POA: Diagnosis not present

## 2019-05-26 LAB — BASIC METABOLIC PANEL
Anion gap: 8 (ref 5–15)
BUN: 21 mg/dL (ref 8–23)
CO2: 21 mmol/L — ABNORMAL LOW (ref 22–32)
Calcium: 8.9 mg/dL (ref 8.9–10.3)
Chloride: 108 mmol/L (ref 98–111)
Creatinine, Ser: 1.03 mg/dL — ABNORMAL HIGH (ref 0.44–1.00)
GFR calc Af Amer: >60 mL/min (ref >60)
GFR calc non Af Amer: 55 mL/min — ABNORMAL LOW (ref >60)
Glucose, Bld: 189 mg/dL — ABNORMAL HIGH (ref 70–99)
Potassium: 4.4 mmol/L (ref 3.5–5.1)
Sodium: 137 mmol/L (ref 135–145)

## 2019-05-26 NOTE — Progress Notes (Signed)
   05/26/19 1205  OT Visit Information  Last OT Received On 05/26/19  Assistance Needed +1  History of Present Illness S/P R RCR, SAD  Precautions  Precautions Shoulder  Type of Shoulder Precautions Sling on at all times except for bathing/dressing/exercises.  Pendulums OK, AROM fingers to elbow  Shoulder Interventions Shoulder abduction pillow  Pain Assessment  Pain Assessment Faces  Faces Pain Scale 4  Pain Location R shoulder  Pain Descriptors / Indicators Aching  Pain Intervention(s) Limited activity within patient's tolerance;Repositioned;Premedicated before session  Cognition  Arousal/Alertness Awake/alert  Behavior During Therapy WFL for tasks assessed/performed  Overall Cognitive Status Within Functional Limits for tasks assessed  ADL  General ADL Comments daughter present for family education.  Donned sling with supervision/cues.  Reviewed protocol, precautions and how to assist her mother with ADLs.  Daughter verbalized understanding  Restrictions  RUE Weight Bearing NWB  OT - End of Session  Activity Tolerance Patient tolerated treatment well  Patient left in chair;with call bell/phone within reach  OT Assessment/Plan  OT Visit Diagnosis Pain  Pain - Right/Left Right  Pain - part of body Shoulder  Follow Up Recommendations Follow surgeon's recommendation for DC plan and follow-up therapies  OT Equipment None recommended by OT  AM-PAC OT "6 Clicks" Daily Activity Outcome Measure (Version 2)  Help from another person eating meals? 3  Help from another person taking care of personal grooming? 3  Help from another person toileting, which includes using toliet, bedpan, or urinal? 2  Help from another person bathing (including washing, rinsing, drying)? 2  Help from another person to put on and taking off regular upper body clothing? 2  Help from another person to put on and taking off regular lower body clothing? 2  6 Click Score 14  ADL Goals  Additional ADL Goal #1  daughter will don sling with supervision and verbalize precautions and how to assist for adls  OT Time Calculation  OT Start Time (ACUTE ONLY) 1010  OT Stop Time (ACUTE ONLY) 1031  OT Time Calculation (min) 21 min  OT General Charges  $OT Visit 1 Visit  OT Treatments  $Self Care/Home Management  8-22 mins  Lesle Chris, OTR/L Acute Rehabilitation Services [phone] pager [phone] office 05/26/2019

## 2019-05-26 NOTE — Progress Notes (Signed)
Patient discharged to home w/ family. Given all belongings, instructions, equipment. Dtr present for therapy teaching. All questions answered. Escorted to pov via w/c.

## 2019-05-26 NOTE — Progress Notes (Signed)
Subjective: 1 Day Post-Op Procedure(s) (LRB): Right shoulder mini open rotator cuff repair, subacromial decompression (Right) Patient reports pain as 1 on 0-10 scale.She is doing very well. We are seeeing her for DR. Beane.    Objective: Vital signs in last 24 hours: Temp:  [96.8 F (36 C)-97.6 F (36.4 C)] 97.6 F (36.4 C) (06/04 0536) Pulse Rate:  [55-67] 67 (06/04 0536) Resp:  [13-20] 18 (06/04 0536) BP: (127-154)/(61-99) 127/64 (06/04 0536) SpO2:  [96 %-100 %] 98 % (06/04 0536)  Intake/Output from previous day: 06/03 0701 - 06/04 0700 In: 2344.3 [P.O.:240; I.V.:1804.3; IV Piggyback:300] Out: 2090 [Urine:2050; Blood:40] Intake/Output this shift: No intake/output data recorded.  No results for input(s): HGB in the last 72 hours. No results for input(s): WBC, RBC, HCT, PLT in the last 72 hours. Recent Labs    05/26/19 0439  NA 137  K 4.4  CL 108  CO2 21*  BUN 21  CREATININE 1.03*  GLUCOSE 189*  CALCIUM 8.9   No results for input(s): LABPT, INR in the last 72 hours.  No cellulitis present   Assessment/Plan: 1 Day Post-Op Procedure(s) (LRB): Right shoulder mini open rotator cuff repair, subacromial decompression (Right) Up with therapy   DC Home today. See DR. Beane in two weeks.  Julie Rush 05/26/2019, 7:12 AM

## 2019-05-26 NOTE — Evaluation (Addendum)
Occupational Therapy Evaluation Patient Details Name: [name] MRN: [id number] DOB: 1951/11/26 Today's Date: 05/26/2019    History of Present Illness S/P R RCR, SAD   Clinical Impression   This 68 year old female was admitted for the above sx.  All education was completed with pt. Will see daughter prior to d/c to educate on sling and precautions.  Got an adequate fit on sling, but positioning is challenging due to arm girth.     Follow Up Recommendations  Follow surgeon's recommendation for DC plan and follow-up therapies    Equipment Recommendations  None recommended by OT(pt doesn't want BSC)    Recommendations for Other Services       Precautions / Restrictions Precautions Precautions: Shoulder Type of Shoulder Precautions: Sling on at all times except for bathing/dressing/exercises.  Pendulums OK, AROM fingers to elbow Shoulder Interventions: Shoulder abduction pillow Precaution Booklet Issued: Yes (comment) Restrictions RUE Weight Bearing: Non weight bearing      Mobility Bed Mobility               General bed mobility comments: in chair  Transfers Overall transfer level: Needs assistance Equipment used: none Transfers: Sit to/from Stand Sit to Stand: Min guard         General transfer comment: for safety    Balance                                           ADL either performed or assessed with clinical judgement   ADL Overall ADL's : Needs assistance/impaired Eating/Feeding: Set up   Grooming: Set up   Upper Body Bathing: Moderate assistance   Lower Body Bathing: Maximal assistance   Upper Body Dressing : Maximal assistance   Lower Body Dressing: Maximal assistance                 General ADL Comments: performed ADL and stood for pendulum exercises.  Pt has a h/o chronic back pain and knee pain. Educated to work within tolerance and skip them if needed.  Reviewed protocol and pt verbalizes understanding.   Able to get arm supported in abductor sling:  difficult fit secondary to short arm with wide girth     Vision         Perception     Praxis      Pertinent Vitals/Pain Pain Assessment: Faces Faces Pain Scale: Hurts little more Pain Location: R shoulder Pain Descriptors / Indicators: Aching;Sore Pain Intervention(s): Limited activity within patient's tolerance;Monitored during session;Premedicated before session;Repositioned     Hand Dominance     Extremity/Trunk Assessment Upper Extremity Assessment Upper Extremity Assessment: RUE deficits/detail(immobilized; able to move elbow to fingers)           Communication Communication Communication: No difficulties   Cognition Arousal/Alertness: Awake/alert Behavior During Therapy: WFL for tasks assessed/performed Overall Cognitive Status: Within Functional Limits for tasks assessed                                     General Comments       Exercises     Shoulder Instructions      Home Living Family/patient expects to be discharged to:: Private residence Living Arrangements: Spouse/significant other Available Help at Discharge: Family(daughter and husband)  Bathroom Shower/Tub: Occupational psychologist: Handicapped height         Additional Comments: grab bar on R.  Family is planning to get her a shower seat      Prior Functioning/Environment Level of Independence: Independent                 OT Problem List: Decreased strength;Decreased range of motion;Decreased activity tolerance;Impaired balance (sitting and/or standing);Pain;Decreased knowledge of precautions;Impaired UE functional use      OT Treatment/Interventions: Self-care/ADL training;DME and/or AE instruction;Balance training;Patient/family education;Therapeutic activities    OT Goals(Current goals can be found in the care plan section) Acute Rehab OT Goals Patient Stated Goal: return to  independence OT Goal Formulation: With patient Time For Goal Achievement: 05/27/19 Potential to Achieve Goals: Good ADL Goals Additional ADL Goal #1: daughter will don sling with supervision and verbalize precautions and how to assist for adls  OT Frequency: Min 2X/week   Barriers to D/C:            Co-evaluation              AM-PAC OT "6 Clicks" Daily Activity     Outcome Measure Help from another person eating meals?: A Little Help from another person taking care of personal grooming?: A Little Help from another person toileting, which includes using toliet, bedpan, or urinal?: A Lot Help from another person bathing (including washing, rinsing, drying)?: A Lot Help from another person to put on and taking off regular upper body clothing?: A Lot Help from another person to put on and taking off regular lower body clothing?: A Lot 6 Click Score: 14   End of Session    Activity Tolerance: Patient tolerated treatment well Patient left: in chair;with call bell/phone within reach  OT Visit Diagnosis: Pain Pain - Right/Left: Right Pain - part of body: Shoulder                Time: 8338-2505 OT Time Calculation (min): 36 min Charges:  OT General Charges $OT Visit: 1 Visit OT Evaluation $OT Eval Low Complexity: 1 Low OT Treatments $Self Care/Home Management : 8-22 mins  Julie Rush, OTR/L Acute Rehabilitation Services [phone] [facility] pager [phone] office 05/26/2019  [place] 05/26/2019, 12:05 PM

## 2019-08-09 ENCOUNTER — Other Ambulatory Visit: Payer: Self-pay

## 2019-08-09 ENCOUNTER — Ambulatory Visit (INDEPENDENT_AMBULATORY_CARE_PROVIDER_SITE_OTHER): Payer: Medicare Other

## 2019-08-09 DIAGNOSIS — Z0000 Encounter for general adult medical examination without abnormal findings: Secondary | ICD-10-CM | POA: Diagnosis not present

## 2019-08-09 NOTE — Patient Instructions (Addendum)
  Julie Rush , Thank you for taking time to come for your Medicare Wellness Visit. I appreciate your ongoing commitment to your health goals. Please review the following plan we discussed and let me know if I can assist you in the future.   These are the goals we discussed: Goals      Patient Stated   . Do not gain any weight (pt-stated)     Maintain current weight while less active with recovery of R shoulder due to torn rotator cuff       This is a list of the screening recommended for you and due dates:  Health Maintenance  Topic Date Due  . Flu Shot  07/23/2019  . Hemoglobin A1C  11/20/2019  . Eye exam for diabetics  01/14/2020  . Complete foot exam   01/22/2020  . Urine Protein Check  01/22/2020  . Mammogram  01/25/2021  . Colon Cancer Screening  04/05/2022  . Tetanus Vaccine  09/21/2024  . DEXA scan (bone density measurement)  Completed  .  Hepatitis C: One time screening is recommended by Center for Disease Control  (CDC) for  adults born from [birth year] through 1967.   Completed  . Pneumonia vaccines  Completed

## 2019-08-09 NOTE — Progress Notes (Signed)
Subjective:   Julie Rush is a 68 y.o. female who presents for an Initial Medicare Annual Wellness Visit.  Review of Systems    No ROS.  Medicare Wellness Virtual Visit.  Visual/audio telehealth visit, UTA vital signs.   See social history for additional risk factors.    Cardiac Risk Factors include: advanced age (>18men, >31 women);hypertension     Objective:    Today's Vitals   There is no height or weight on file to calculate BMI.  Advanced Directives 08/09/2019 05/25/2019 05/20/2019 01/22/2019 11/29/2018 11/01/2018 06/08/2012  Does Patient Have a Medical Advance Directive? No No No No No No Patient does not have advance directive;Patient would like information  Would patient like information on creating a medical advance directive? No - Patient declined No - Patient declined No - Patient declined No - Patient declined No - Patient declined No - Patient declined Referral made to social work    Current Medications (verified) Outpatient Encounter Medications as of 08/09/2019  Medication Sig  . acetaminophen (TYLENOL) 500 MG tablet Take 1,000 mg by mouth every 6 (six) hours as needed for moderate pain or headache.   . albuterol (PROVENTIL HFA) 108 (90 Base) MCG/ACT inhaler Inhale 2 puffs into the lungs every 6 (six) hours as needed for wheezing or shortness of breath.  Marland Kitchen aspirin EC 81 MG tablet Take 1 tablet (81 mg total) daily by mouth.  Marland Kitchen b complex vitamins tablet Take 2 tablets by mouth daily.  Marland Kitchen buPROPion (WELLBUTRIN SR) 150 MG 12 hr tablet TAKE 1 TABLET BY MOUTH TWICE A DAY (Patient taking differently: Take 150 mg by mouth 2 (two) times daily. )  . methocarbamol (ROBAXIN) 500 MG tablet Take 1 tablet (500 mg total) by mouth every 8 (eight) hours as needed for muscle spasms.  . metoprolol succinate (TOPROL-XL) 50 MG 24 hr tablet TAKE 1 TABLET BY MOUTH EVERY MORNING WITH OR IMMEDIATELY FOLLOWING A MEAL (Patient taking differently: Take 50 mg by mouth daily. )  . omeprazole  (PRILOSEC) 20 MG capsule Take 20 mg by mouth daily as needed (heartburn).   . triamcinolone cream (KENALOG) 0.1 % Apply 1 application topically 2 (two) times daily as needed for itching.   No facility-administered encounter medications on file as of 08/09/2019.     Allergies (verified) Codeine, Prednisone, Sulfa antibiotics, and Trimethoprim   History: Past Medical History:  Diagnosis Date  . Anginal pain ([place])   . Arthritis    "knees, back, fingers, shoulders, neck"  . Chronic lower back pain   . Depression 06/08/12   "related to not getting definitive dx of what's wrong"  . Exertional dyspnea   . GERD (gastroesophageal reflux disease)   . H/O hiatal hernia   . Headache(784.0)   . History of stomach ulcers 1980's  . Hypertension   . Migraines    4-5x/yr  . Pneumonia   . PONV (postoperative nausea and vomiting)   . Seasonal asthma   . Sleep apnea 2013   "dx'd borderline"  . Type II diabetes mellitus ([place]) 2013   "borderline"  . Vertigo    last episode over 6 mos ago   Past Surgical History:  Procedure Laterality Date  . APPENDECTOMY  1960's  . BREAST BIOPSY     right  . CATARACT EXTRACTION W/PHACO Left 11/01/2018   Procedure: CATARACT EXTRACTION PHACO AND INTRAOCULAR LENS PLACEMENT ([place]) LEFT;  Surgeon: Eulogio Bear, MD;  Location: [facility];  Service: Ophthalmology;  Laterality: Left;  .  CATARACT EXTRACTION W/PHACO Right 11/29/2018   Procedure: CATARACT EXTRACTION PHACO AND INTRAOCULAR LENS PLACEMENT ([place]) RIGHT;  Surgeon: Eulogio Bear, MD;  Location: [facility];  Service: Ophthalmology;  Laterality: Right;  Diabetes - diet conrolled sleep apnea  . CHOLECYSTECTOMY  2006  . EXPLORATORY LAPAROTOMY  late 1990's   "blocked colon S/P hernia repair"  . HERNIA REPAIR     "@ least 3 abdominal hernia repairs"  . [place]  . SHOULDER ARTHROSCOPY WITH ROTATOR CUFF REPAIR AND SUBACROMIAL DECOMPRESSION Right 05/25/2019   Procedure:  Right shoulder mini open rotator cuff repair, subacromial decompression;  Surgeon: Susa Day, MD;  Location: [facility];  Service: Orthopedics;  Laterality: Right;  90 mins  . VAGINAL HYSTERECTOMY  1981   Family History  Problem Relation Age of Onset  . Arthritis Mother   . Cancer Father        prostate  . Hyperlipidemia Father   . Alcohol abuse Father   . Arthritis Father   . Heart disease Father   . Stroke Father   . Hypertension Father   . Diabetes Father   . Cancer Sister        ovary  . Arthritis Sister   . Cancer Brother        thyroid  . Arthritis Brother   . Cancer Sister        breast  . Arthritis Sister   . Breast cancer Sister 34  . Arthritis Maternal Grandmother   . Arthritis Maternal Grandfather    Social History   Socioeconomic History  . Marital status: Married    Spouse name: Not on file  . Number of children: Not on file  . Years of education: Not on file  . Highest education level: Not on file  Occupational History  . Not on file  Social Needs  . Financial resource strain: Not hard at all  . Food insecurity    Worry: Never true    Inability: Never true  . Transportation needs    Medical: No    Non-medical: No  Tobacco Use  . Smoking status: Former Smoker    Packs/day: 2.00    Years: 14.00    Pack years: 28.00    Quit date: 11/08/1975    Years since quitting: 43.7  . Smokeless tobacco: Never Used  Substance and Sexual Activity  . Alcohol use: No    Alcohol/week: 0.0 standard drinks  . Drug use: No  . Sexual activity: Yes    Partners: Male    Comment: Husband  Lifestyle  . Physical activity    Days per week: Not on file    Minutes per session: Not on file  . Stress: Not at all  Relationships  . Social Herbalist on phone: Not on file    Gets together: Not on file    Attends religious service: Not on file    Active member of club or organization: Not on file    Attends meetings of clubs or organizations: Not on file     Relationship status: Not on file  Other Topics Concern  . Not on file  Social History Narrative   Retired.   Lives with husband    Grown children- 2 daughters    2 grandsons    Pets- goats and dogs    Right handed    Caffeine- 1 soda a day, decaff coffee and tea other times    Enjoys reading  Tobacco Counseling Counseling given: Not Answered   Clinical Intake:  Pre-visit preparation completed: Yes        Diabetes: No  How often do you need to have someone help you when you read instructions, pamphlets, or other written materials from your doctor or pharmacy?: 1 - Never  Interpreter Needed?: No      Activities of Daily Living In your present state of health, do you have any difficulty performing the following activities: 08/09/2019 05/25/2019  Hearing? Y -  Comment R ear hearing impairment. -  Vision? N -  Difficulty concentrating or making decisions? N -  Walking or climbing stairs? Y -  Comment Unsteady gait; cane in use when ambulating. -  Dressing or bathing? N -  Doing errands, shopping? N N  Preparing Food and eating ? Y -  Using the Toilet? N -  In the past six months, have you accidently leaked urine? Y -  Comment Stress inct. Followed by pcp. Managed with daily pad. -  Do you have problems with loss of bowel control? N -  Managing your Medications? N -  Managing your Finances? N -  Housekeeping or managing your Housekeeping? Y -  Comment Husband assist -  Some recent data might be hidden     Immunizations and Health Maintenance Immunization History  Administered Date(s) Administered  . Pneumococcal Conjugate-13 11/02/2017  . Pneumococcal Polysaccharide-23 01/21/2019  . Pneumococcal-Unspecified 09/21/2014  . Tdap 09/21/2014   Health Maintenance Due  Topic Date Due  . INFLUENZA VACCINE  07/23/2019    Patient Care Team: Burnard Hawthorne, FNP as PCP - General (Family Medicine)  Indicate any recent Medical Services you may have received  from other than [facility] providers in the past year (date may be approximate).     Assessment:   This is a routine wellness examination for Julie Rush.  I connected with patient 08/09/19 at  1:00 PM EDT by an audio enabled telemedicine application and verified that I am speaking with the correct person using two identifiers. Patient stated full name and DOB. Patient gave permission to continue with virtual visit. Patient's location was at home and Nurse's location was at [place] office.   Health Maintenance Due: Influenza vaccine 2020- discontinued per patient preference. States she has a history of getting sick after receiving the vaccine and will no longer receive.   See completed HM at the end of note.   Eye: Visual acuity not assessed. Virtual visit. Wears corrective lenses. Followed by their ophthalmologist every 12 months.   Dental: Visits every 6 months.    Hearing: R ear hearing impairment. She chooses not to wear a hearing aid at this time. Followed by ENT.  Safety:  Patient feels safe at home- yes Patient does have smoke detectors at home- yes Patient does wear sunscreen or protective clothing when in direct sunlight - yes Patient does wear seat belt when in a moving vehicle - yes Patient drives- yes Adequate lighting in walkways free from debris- yes Grab bars and handrails used as appropriate- yes Ambulates with an assistive device as needed- cane Cell phone or lifeline/life alert/medic alert on person when ambulating outside of the home- yes  Social: Alcohol intake - no  Smoking history- former Smokers in home? none Illicit drug use? none  Depression: PHQ 2 &9 complete. See screening below. Denies irritability, anhedonia, sadness/tearfullness.  Stable with regimen.   Falls: See screening below.    Immunizations: The following Immunizations were discussed: Influenza, shingles, pneumonia, and  tetanus. Influenza discontinued. Shingles- she will think about it and notify  her doctor.   Medication: Taking as directed and without issues.   Covid-19: Precautions and sickness symptoms discussed. Wears mask, social distancing, hand hygiene as appropriate.   Activities of Daily Living Patient denies needing assistance with: feeding themselves, getting from bed to chair, getting to the toilet, managing money.  Assisted by husband with household chores, cooking, bathing/dressing as needed while recovering from rotator cuff surgery.   Memory: Patient is alert. Patient denies difficulty focusing or concentrating. Correctly identified the president of the [place], season and recall. Patient likes to read, enjoy word find and complete puzzles for brain stimulation.  BMI- discussed the importance of a healthy diet, water intake and the benefits of aerobic exercise.  Educational material provided.  Physical activity- physical therapy for R shoulder one day weekly, 60 minutes. Encouraged to exercise legs while seated at home with chair exercises.   Diet: regular Water: good intake  Advanced Directive: End of life planning; Advanced aging; Advanced directives discussed.  No HCPOA/Living Will.  Additional information declined at this time.  Other Providers Patient Care Team: Burnard Hawthorne, FNP as PCP - General (Family Medicine) Hearing/Vision screen  Hearing Screening   125Hz  250Hz  500Hz  1000Hz  2000Hz  3000Hz  4000Hz  6000Hz  8000Hz   Right ear:           Left ear:           Comments: R ear hearing impairment. She chooses not to wear a hearing aid at this time. Followed by ENT.   Vision Screening Comments: Wears corrective lenses Cataract extraction, bilateral Visual acuity not assessed, virtual visit.  They have seen their ophthalmologist in the last 12 months.     Dietary issues and exercise activities discussed: Current Exercise Habits: Structured exercise class, Time (Minutes): 60, Frequency (Times/Week): 1, Weekly Exercise (Minutes/Week): 60, Intensity:  Mild, Exercise limited by: orthopedic condition(s)  Goals      Patient Stated   . Do not gain any weight (pt-stated)     Maintain current weight while less active with recovery of R shoulder due to torn rotator cuff      Depression Screen PHQ 2/9 Scores 08/09/2019 11/20/2016 04/02/2015  PHQ - 2 Score 0 0 0    Fall Risk Fall Risk  08/09/2019 07/23/2018 11/20/2016 04/02/2015  Falls in the past year? 1 No No No  Comment - [place] Telephone Survey: data to providers prior to load - -  Number falls in past yr: 0 - - -  Injury with Fall? 1 - - -  Comment Followed by ortho and pcp. Physical therapy currently in session. - - -  Follow up Falls prevention discussed;Education provided - - -   Timed Get Up and Go Performed no, virtual visit.   Cognitive Function:     6CIT Screen 08/09/2019  What Year? 0 points  What month? 0 points  What time? 0 points  Count back from 20 0 points  Months in reverse 0 points  Repeat phrase 0 points  Total Score 0    Screening Tests Health Maintenance  Topic Date Due  . INFLUENZA VACCINE  07/23/2019  . HEMOGLOBIN A1C  11/20/2019  . OPHTHALMOLOGY EXAM  01/14/2020  . FOOT EXAM  01/22/2020  . URINE MICROALBUMIN  01/22/2020  . MAMMOGRAM  01/25/2021  . COLONOSCOPY  04/05/2022  . TETANUS/TDAP  09/21/2024  . DEXA SCAN  Completed  . Hepatitis C Screening  Completed  . PNA vac Low Risk  Adult  Completed     Plan:    Keep all routine maintenance appointments. Followed up as needed.   Medicare Attestation I have personally reviewed: The patient's medical and social history Their use of alcohol, tobacco or illicit drugs Their current medications and supplements The patient's functional ability including ADLs,fall risks, home safety risks, cognitive, and hearing and visual impairment Diet and physical activities Evidence for depression   In addition, I have reviewed and discussed with patient certain preventive protocols, quality metrics, and best  practice recommendations. A written personalized care plan for preventive services as well as general preventive health recommendations were provided to patient.     Varney Biles, LPN   6/96/2952    Agree with plan. Mable Paris, NP

## 2019-08-22 ENCOUNTER — Other Ambulatory Visit: Payer: Self-pay | Admitting: Family

## 2019-08-22 DIAGNOSIS — F411 Generalized anxiety disorder: Secondary | ICD-10-CM

## 2019-11-05 ENCOUNTER — Encounter: Payer: Self-pay | Admitting: Family

## 2020-02-16 ENCOUNTER — Other Ambulatory Visit: Payer: Self-pay | Admitting: Family

## 2020-02-16 DIAGNOSIS — F411 Generalized anxiety disorder: Secondary | ICD-10-CM

## 2020-03-05 ENCOUNTER — Other Ambulatory Visit: Payer: Self-pay | Admitting: Family

## 2020-03-05 DIAGNOSIS — Z1231 Encounter for screening mammogram for malignant neoplasm of breast: Secondary | ICD-10-CM

## 2020-03-06 ENCOUNTER — Ambulatory Visit
Admission: RE | Admit: 2020-03-06 | Discharge: 2020-03-06 | Disposition: A | Discharge status: Home / Self Care | Payer: Medicare Other | Source: Ambulatory Visit | Attending: Family | Admitting: Family

## 2020-03-06 ENCOUNTER — Other Ambulatory Visit: Payer: Self-pay

## 2020-03-06 DIAGNOSIS — Z1231 Encounter for screening mammogram for malignant neoplasm of breast: Secondary | ICD-10-CM

## 2020-03-30 ENCOUNTER — Encounter: Payer: Self-pay | Admitting: Family

## 2020-03-30 ENCOUNTER — Other Ambulatory Visit: Payer: Self-pay

## 2020-03-30 DIAGNOSIS — I10 Essential (primary) hypertension: Secondary | ICD-10-CM

## 2020-03-30 MED ORDER — METOPROLOL SUCCINATE ER 50 MG PO TB24: ORAL_TABLET | ORAL | 0 refills | Status: DC

## 2020-05-01 IMAGING — MR MR SHOULDER*R* W/O CM
5 series · 36 of 40 positions shown · non-contrast
Comparison: Right shoulder radiographs 01/22/2019.

CLINICAL DATA: Right shoulder pain and weakness. Frequent popping
sounds. No acute injury or prior relevant surgery.

EXAM:
MRI OF THE RIGHT SHOULDER WITHOUT CONTRAST
TECHNIQUE: Multiplanar, multisequence MR imaging of the shoulder was performed.
No intravenous contrast was administered.

[Series 3: PD fat-sat · axial · 4.0mm · 0.55mm/px · z∈[+3,+86]mm · 9 of 20 slices shown (1 of 2)]
[im 1/20]
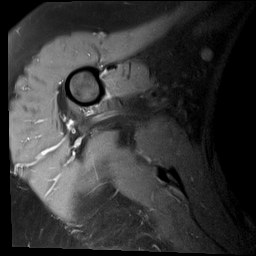
[im 3/20]
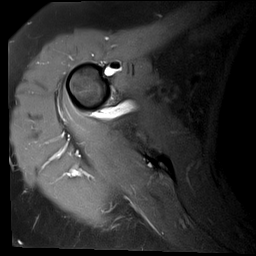
[im 5/20]
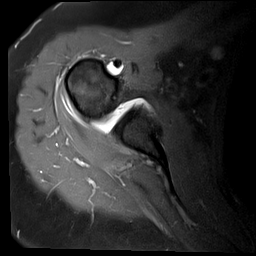
[im 8/20]
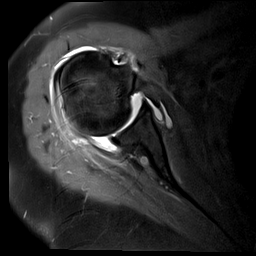
[im 10/20]
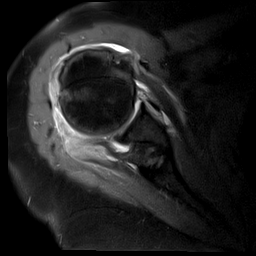
[im 12/20]
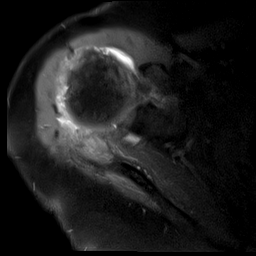
[im 15/20]
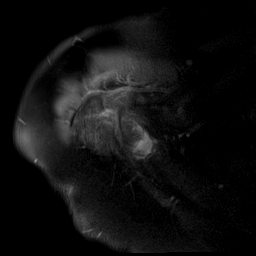
[im 17/20]
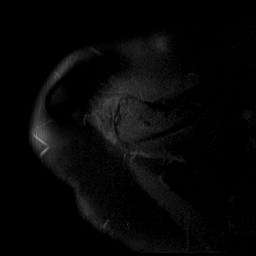
[im 20/20]
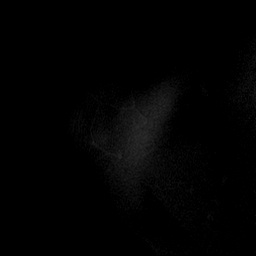

[Series 4: T2 fat-sat · oblique · 4.0mm · 0.55mm/px · 8 of 19 slices shown (1 of 2)]
[im 1/19]
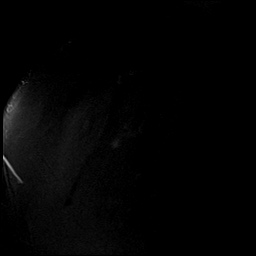
[im 3/19]
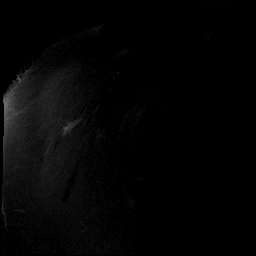
[im 6/19]
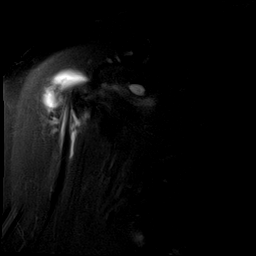
[im 8/19]
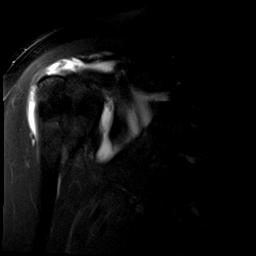
[im 11/19]
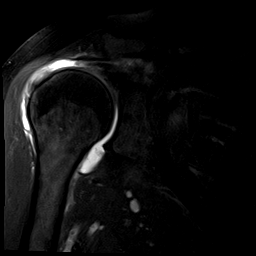
[im 13/19]
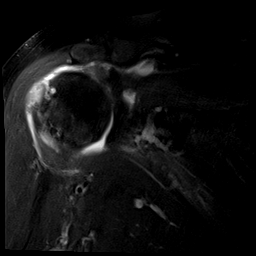
[im 16/19]
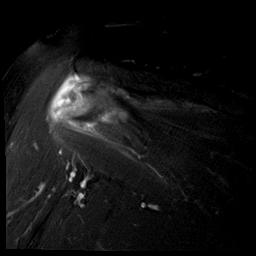
[im 19/19]
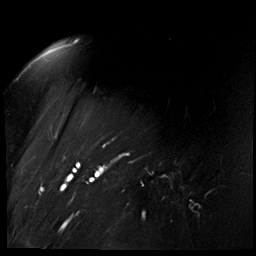

[Series 5: PD fat-sat · oblique · 4.0mm · 0.27mm/px · 7 of 18 slices shown (2 of 2)]
[im 1/18]
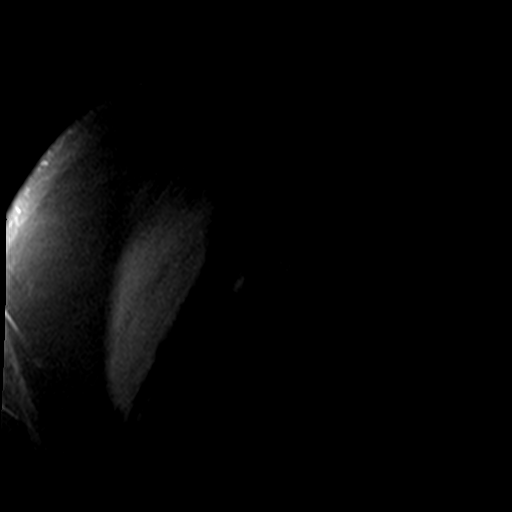
[im 3/18]
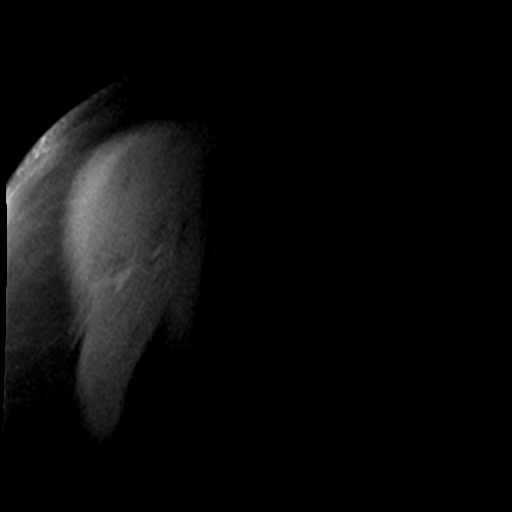
[im 6/18]
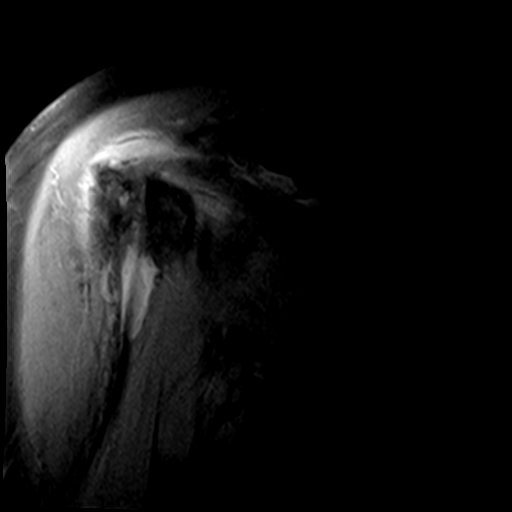
[im 9/18]
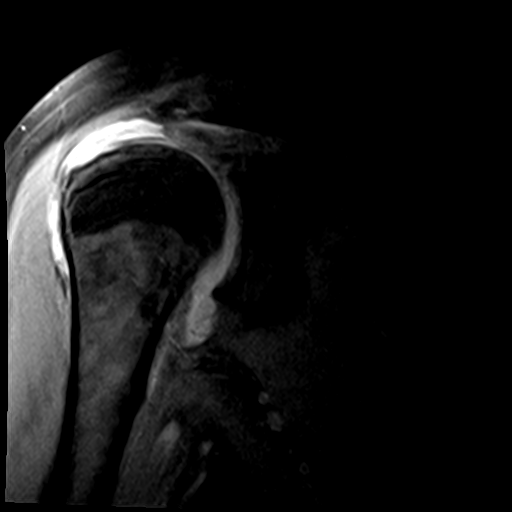
[im 12/18]
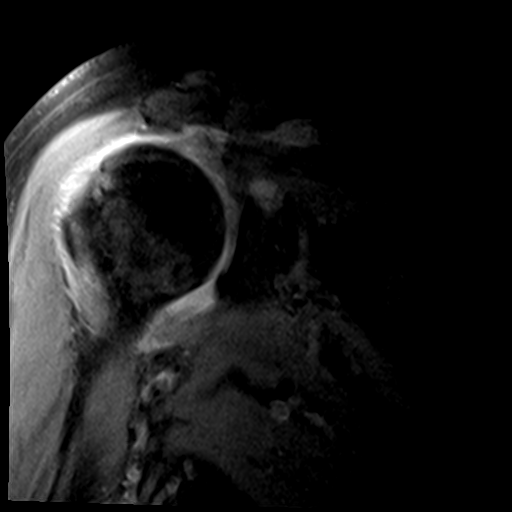
[im 15/18]
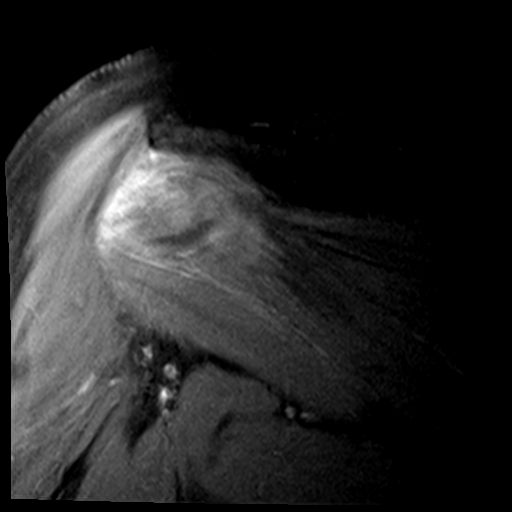
[im 18/18]
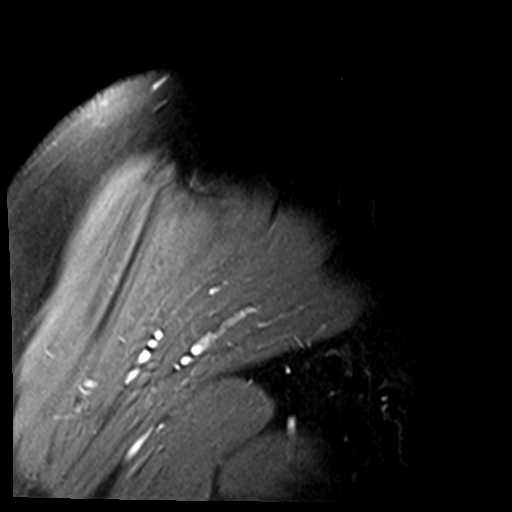

[Series 6: T1 · oblique · 4.0mm · 0.27mm/px · 4 of 21 slices shown]
[im 1/21]
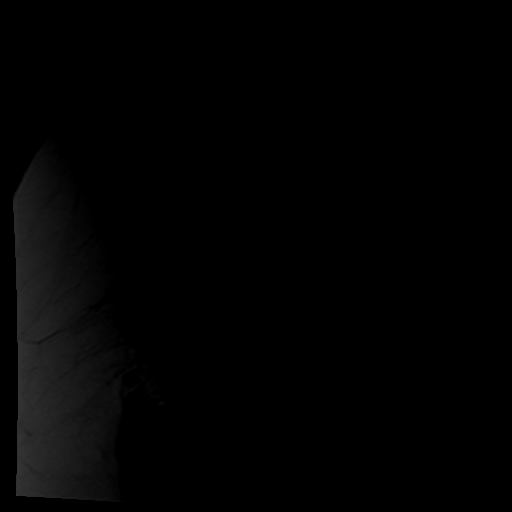
[im 3/21]
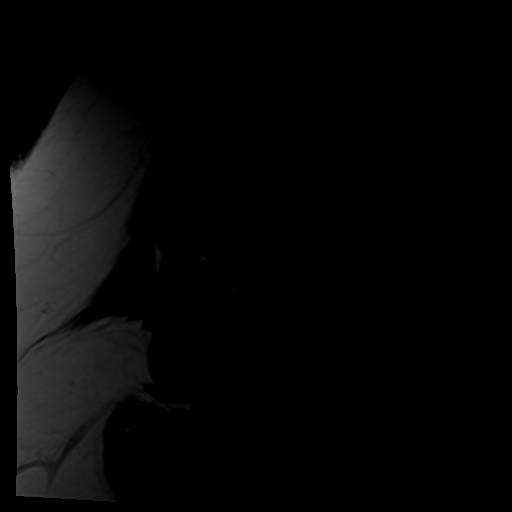
[im 6/21]
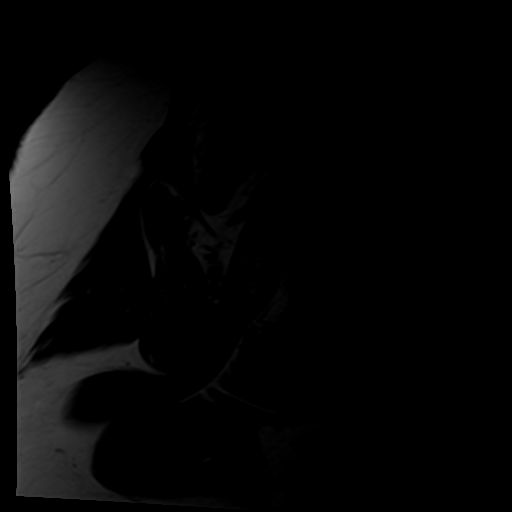
[im 9/21]
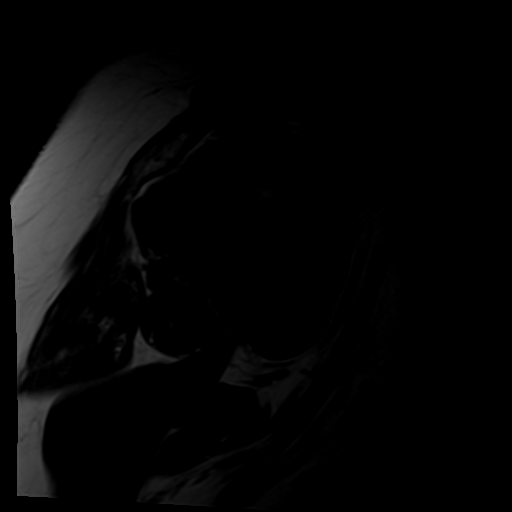

[Series 7: T2 fat-sat · oblique · 4.0mm · 0.55mm/px · 8 of 21 slices shown (2 of 2)]
[im 1/21]
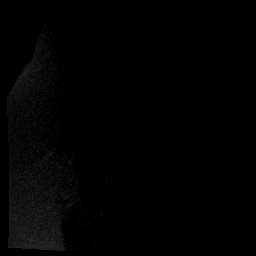
[im 3/21]
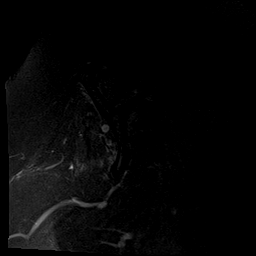
[im 6/21]
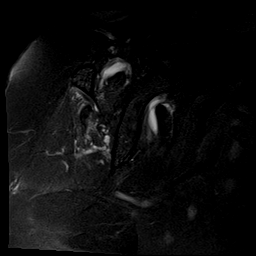
[im 9/21]
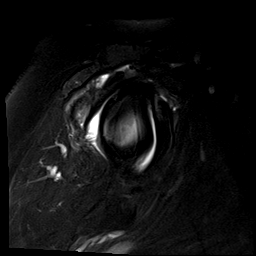
[im 12/21]
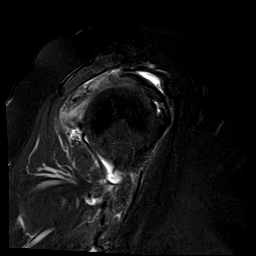
[im 15/21]
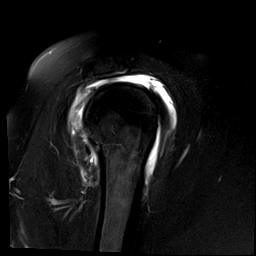
[im 18/21]
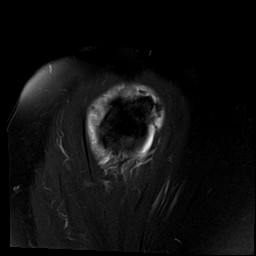
[im 21/21]
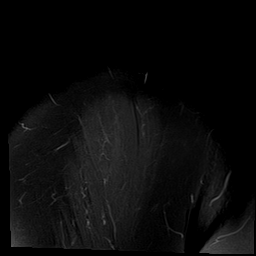

[36 of 40 positions shown; findings below may reference images not displayed]

FINDINGS: Despite efforts by the technologist and patient, mild motion
artifact is present on today's exam and could not be eliminated.
This reduces exam sensitivity and specificity.

Rotator cuff: There is diffuse rotator cuff tendinosis. There is a
large full-thickness rotator cuff tear. The supraspinatus and
infraspinatus tendons are completely torn and moderately retracted.
The subscapularis and teres minor tendons appear intact.

Muscles: Mild supraspinatus and infraspinatus muscular atrophy. Mild
infraspinatus muscular edema.

Biceps long head: Intact and normally positioned. Probable mild
tendinosis of the intra-articular portion.

Acromioclavicular Joint: The acromion is type 1. There are mild
acromioclavicular degenerative changes. A moderate amount of fluid
in the subacromial-subdeltoid bursa freely communicates with the
shoulder joint via the full-thickness rotator cuff tear.

Glenohumeral Joint: Mild glenohumeral degenerative changes. Small
shoulder joint effusion.

Labrum: Superior and posterior labral tear without discrete tear or
paralabral cyst.

Bones: No acute or significant extra-articular osseous findings.

Other: No significant soft tissue findings.
IMPRESSION: 1. Large full-thickness rotator cuff tear with retracted insertional
tears of the supraspinatus and infraspinatus tendons. Mild
associated muscular atrophy.
2. Mild glenohumeral and acromioclavicular degenerative changes with
labral degeneration.

## 2020-06-23 ENCOUNTER — Other Ambulatory Visit: Payer: Self-pay | Admitting: Family

## 2020-06-23 DIAGNOSIS — I10 Essential (primary) hypertension: Secondary | ICD-10-CM

## 2020-08-09 ENCOUNTER — Ambulatory Visit: Payer: Medicare Other

## 2020-08-29 ENCOUNTER — Ambulatory Visit: Payer: Medicare Other

## 2020-09-04 ENCOUNTER — Ambulatory Visit (INDEPENDENT_AMBULATORY_CARE_PROVIDER_SITE_OTHER): Payer: Medicare Other

## 2020-09-04 ENCOUNTER — Telehealth: Payer: Self-pay

## 2020-09-04 VITALS — Ht 65.0 in | Wt 243.0 lb

## 2020-09-04 DIAGNOSIS — I10 Essential (primary) hypertension: Secondary | ICD-10-CM

## 2020-09-04 DIAGNOSIS — E1121 Type 2 diabetes mellitus with diabetic nephropathy: Secondary | ICD-10-CM

## 2020-09-04 DIAGNOSIS — Z0000 Encounter for general adult medical examination without abnormal findings: Secondary | ICD-10-CM | POA: Diagnosis not present

## 2020-09-04 DIAGNOSIS — F411 Generalized anxiety disorder: Secondary | ICD-10-CM | POA: Diagnosis not present

## 2020-09-04 DIAGNOSIS — E559 Vitamin D deficiency, unspecified: Secondary | ICD-10-CM

## 2020-09-04 NOTE — Patient Instructions (Addendum)
Julie Rush , Thank you for taking time to come for your Medicare Wellness Visit. I appreciate your ongoing commitment to your health goals. Please review the following plan we discussed and let me know if I can assist you in the future.   These are the goals we discussed: Goals      Patient Stated   .  Continue to lose weight (pt-stated)      Portion control meals        This is a list of the screening recommended for you and due dates:  Health Maintenance  Topic Date Due  . Hemoglobin A1C  11/20/2019  . Eye exam for diabetics  01/14/2020  . Complete foot exam   01/22/2020  . Urine Protein Check  01/22/2020  . Flu Shot  Never done  . COVID-19 Vaccine (1) 09/20/2020*  . Mammogram  03/06/2022  . Colon Cancer Screening  04/05/2022  . Tetanus Vaccine  09/21/2024  . DEXA scan (bone density measurement)  Completed  .  Hepatitis C: One time screening is recommended by Center for Disease Control  (CDC) for  adults born from [birth year] through 1967.   Completed  . Pneumonia vaccines  Completed  *Topic was postponed. The date shown is not the original due date.    Immunizations Immunization History  Administered Date(s) Administered  . Pneumococcal Conjugate-13 11/02/2017  . Pneumococcal Polysaccharide-23 01/21/2019  . Pneumococcal-Unspecified 09/21/2014  . Tdap 09/21/2014   Keep all routine maintenance appointments.   Fasting labs 09/10/20 @ 10:15  Follow up 09/12/20 @ 1:30  Conditions/risks identified: none new  Follow up in one year for your annual wellness visit.   Preventive Care 66 Years and Older, Female Preventive care refers to lifestyle choices and visits with your health care provider that can promote health and wellness. What does preventive care include?  A yearly physical exam. This is also called an annual well check.  Dental exams once or twice a year.  Routine eye exams. Ask your health care provider how often you should have your eyes  checked.  Personal lifestyle choices, including:  Daily care of your teeth and gums.  Regular physical activity.  Eating a healthy diet.  Avoiding tobacco and drug use.  Limiting alcohol use.  Practicing safe sex.  Taking low-dose aspirin every day.  Taking vitamin and mineral supplements as recommended by your health care provider. What happens during an annual well check? The services and screenings done by your health care provider during your annual well check will depend on your age, overall health, lifestyle risk factors, and family history of disease. Counseling  Your health care provider may ask you questions about your:  Alcohol use.  Tobacco use.  Drug use.  Emotional well-being.  Home and relationship well-being.  Sexual activity.  Eating habits.  History of falls.  Memory and ability to understand (cognition).  Work and work Statistician.  Reproductive health. Screening  You may have the following tests or measurements:  Height, weight, and BMI.  Blood pressure.  Lipid and cholesterol levels. These may be checked every 5 years, or more frequently if you are over 60 years old.  Skin check.  Lung cancer screening. You may have this screening every year starting at age 23 if you have a 30-pack-year history of smoking and currently smoke or have quit within the past 15 years.  Fecal occult blood test (FOBT) of the stool. You may have this test every year starting at age 74.  Flexible  sigmoidoscopy or colonoscopy. You may have a sigmoidoscopy every 5 years or a colonoscopy every 10 years starting at age 78.  Hepatitis C blood test.  Hepatitis B blood test.  Sexually transmitted disease (STD) testing.  Diabetes screening. This is done by checking your blood sugar (glucose) after you have not eaten for a while (fasting). You may have this done every 1-3 years.  Bone density scan. This is done to screen for osteoporosis. You may have this done  starting at age 55.  Mammogram. This may be done every 1-2 years. Talk to your health care provider about how often you should have regular mammograms. Talk with your health care provider about your test results, treatment options, and if necessary, the need for more tests. Vaccines  Your health care provider may recommend certain vaccines, such as:  Influenza vaccine. This is recommended every year.  Tetanus, diphtheria, and acellular pertussis (Tdap, Td) vaccine. You may need a Td booster every 10 years.  Zoster vaccine. You may need this after age 39.  Pneumococcal 13-valent conjugate (PCV13) vaccine. One dose is recommended after age 73.  Pneumococcal polysaccharide (PPSV23) vaccine. One dose is recommended after age 79. Talk to your health care provider about which screenings and vaccines you need and how often you need them. This information is not intended to replace advice given to you by your health care provider. Make sure you discuss any questions you have with your health care provider. Document Released: 01/04/2016 Document Revised: 08/27/2016 Document Reviewed: 10/09/2015 Elsevier Interactive Patient Education  2017 [employer] Prevention in the Home Falls can cause injuries. They can happen to people of all ages. There are many things you can do to make your home safe and to help prevent falls. What can I do on the outside of my home?  Regularly fix the edges of walkways and driveways and fix any cracks.  Remove anything that might make you trip as you walk through a door, such as a raised step or threshold.  Trim any bushes or trees on the path to your home.  Use bright outdoor lighting.  Clear any walking paths of anything that might make someone trip, such as rocks or tools.  Regularly check to see if handrails are loose or broken. Make sure that both sides of any steps have handrails.  Any raised decks and porches should have guardrails on the  edges.  Have any leaves, snow, or ice cleared regularly.  Use sand or salt on walking paths during winter.  Clean up any spills in your garage right away. This includes oil or grease spills. What can I do in the bathroom?  Use night lights.  Install grab bars by the toilet and in the tub and shower. Do not use towel bars as grab bars.  Use non-skid mats or decals in the tub or shower.  If you need to sit down in the shower, use a plastic, non-slip stool.  Keep the floor dry. Clean up any water that spills on the floor as soon as it happens.  Remove soap buildup in the tub or shower regularly.  Attach bath mats securely with double-sided non-slip rug tape.  Do not have throw rugs and other things on the floor that can make you trip. What can I do in the bedroom?  Use night lights.  Make sure that you have a light by your bed that is easy to reach.  Do not use any sheets or blankets that  are too big for your bed. They should not hang down onto the floor.  Have a firm chair that has side arms. You can use this for support while you get dressed.  Do not have throw rugs and other things on the floor that can make you trip. What can I do in the kitchen?  Clean up any spills right away.  Avoid walking on wet floors.  Keep items that you use a lot in easy-to-reach places.  If you need to reach something above you, use a strong step stool that has a grab bar.  Keep electrical cords out of the way.  Do not use floor polish or wax that makes floors slippery. If you must use wax, use non-skid floor wax.  Do not have throw rugs and other things on the floor that can make you trip. What can I do with my stairs?  Do not leave any items on the stairs.  Make sure that there are handrails on both sides of the stairs and use them. Fix handrails that are broken or loose. Make sure that handrails are as long as the stairways.  Check any carpeting to make sure that it is firmly  attached to the stairs. Fix any carpet that is loose or worn.  Avoid having throw rugs at the top or bottom of the stairs. If you do have throw rugs, attach them to the floor with carpet tape.  Make sure that you have a light switch at the top of the stairs and the bottom of the stairs. If you do not have them, ask someone to add them for you. What else can I do to help prevent falls?  Wear shoes that:  Do not have high heels.  Have rubber bottoms.  Are comfortable and fit you well.  Are closed at the toe. Do not wear sandals.  If you use a stepladder:  Make sure that it is fully opened. Do not climb a closed stepladder.  Make sure that both sides of the stepladder are locked into place.  Ask someone to hold it for you, if possible.  Clearly mark and make sure that you can see:  Any grab bars or handrails.  First and last steps.  Where the edge of each step is.  Use tools that help you move around (mobility aids) if they are needed. These include:  Canes.  Walkers.  Scooters.  Crutches.  Turn on the lights when you go into a dark area. Replace any light bulbs as soon as they burn out.  Set up your furniture so you have a clear path. Avoid moving your furniture around.  If any of your floors are uneven, fix them.  If there are any pets around you, be aware of where they are.  Review your medicines with your doctor. Some medicines can make you feel dizzy. This can increase your chance of falling. Ask your doctor what other things that you can do to help prevent falls. This information is not intended to replace advice given to you by your health care provider. Make sure you discuss any questions you have with your health care provider. Document Released: 10/04/2009 Document Revised: 05/15/2016 Document Reviewed: 01/12/2015 Elsevier Interactive Patient Education  2017 [employer].

## 2020-09-04 NOTE — Progress Notes (Addendum)
Subjective:   Julie Rush is a 69 y.o. female who presents for Medicare Annual (Subsequent) preventive examination.  Review of Systems    No ROS.  Medicare Wellness Virtual Visit.       Objective:    Today's Vitals   09/04/20 0945  Weight: 243 lb (110.2 kg)  Height: 5\' 5"  (1.651 m)   Body mass index is 40.44 kg/m.  Advanced Directives 09/04/2020 08/09/2019 05/25/2019 05/20/2019 01/22/2019 11/29/2018 11/01/2018  Does Patient Have a Medical Advance Directive? No No No No No No No  Would patient like information on creating a medical advance directive? No - Patient declined No - Patient declined No - Patient declined No - Patient declined No - Patient declined No - Patient declined No - Patient declined    Current Medications (verified) Outpatient Encounter Medications as of 09/04/2020  Medication Sig  . acetaminophen (TYLENOL) 500 MG tablet Take 1,000 mg by mouth every 6 (six) hours as needed for moderate pain or headache.   . albuterol (PROVENTIL HFA) 108 (90 Base) MCG/ACT inhaler Inhale 2 puffs into the lungs every 6 (six) hours as needed for wheezing or shortness of breath.  Marland Kitchen aspirin EC 81 MG tablet Take 1 tablet (81 mg total) daily by mouth.  Marland Kitchen b complex vitamins tablet Take 2 tablets by mouth daily.  Marland Kitchen buPROPion (WELLBUTRIN SR) 150 MG 12 hr tablet TAKE 1 TABLET BY MOUTH TWICE A DAY  . methocarbamol (ROBAXIN) 500 MG tablet Take 1 tablet (500 mg total) by mouth every 8 (eight) hours as needed for muscle spasms.  . metoprolol succinate (TOPROL-XL) 50 MG 24 hr tablet TAKE 1 TABLET BY MOUTH EVERY MORNING WITH OR IMMEDIATELY FOLLOWING A MEAL  . omeprazole (PRILOSEC) 20 MG capsule Take 20 mg by mouth daily as needed (heartburn).   . triamcinolone cream (KENALOG) 0.1 % Apply 1 application topically 2 (two) times daily as needed for itching.   No facility-administered encounter medications on file as of 09/04/2020.    Allergies (verified) Codeine, Prednisone, Sulfa antibiotics, and  Trimethoprim   History: Past Medical History:  Diagnosis Date  . Anginal pain ([place])   . Arthritis    "knees, back, fingers, shoulders, neck"  . Chronic lower back pain   . Depression 06/08/12   "related to not getting definitive dx of what's wrong"  . Exertional dyspnea   . GERD (gastroesophageal reflux disease)   . H/O hiatal hernia   . Headache(784.0)   . History of stomach ulcers 1980's  . Hypertension   . Migraines    4-5x/yr  . Pneumonia   . PONV (postoperative nausea and vomiting)   . Seasonal asthma   . Sleep apnea 2013   "dx'd borderline"  . Type II diabetes mellitus ([place]) 2013   "borderline"  . Vertigo    last episode over 6 mos ago   Past Surgical History:  Procedure Laterality Date  . APPENDECTOMY  1960's  . BREAST BIOPSY     right  . CATARACT EXTRACTION W/PHACO Left 11/01/2018   Procedure: CATARACT EXTRACTION PHACO AND INTRAOCULAR LENS PLACEMENT ([place]) LEFT;  Surgeon: Eulogio Bear, MD;  Location: [facility];  Service: Ophthalmology;  Laterality: Left;  . CATARACT EXTRACTION W/PHACO Right 11/29/2018   Procedure: CATARACT EXTRACTION PHACO AND INTRAOCULAR LENS PLACEMENT ([place]) RIGHT;  Surgeon: Eulogio Bear, MD;  Location: [facility];  Service: Ophthalmology;  Laterality: Right;  Diabetes - diet conrolled sleep apnea  . CHOLECYSTECTOMY  2006  . EXPLORATORY  LAPAROTOMY  late 1990's   "blocked colon S/P hernia repair"  . HERNIA REPAIR     "@ least 3 abdominal hernia repairs"  . [place]  . SHOULDER ARTHROSCOPY WITH ROTATOR CUFF REPAIR AND SUBACROMIAL DECOMPRESSION Right 05/25/2019   Procedure: Right shoulder mini open rotator cuff repair, subacromial decompression;  Surgeon: Susa Day, MD;  Location: [facility];  Service: Orthopedics;  Laterality: Right;  90 mins  . VAGINAL HYSTERECTOMY  1981   Family History  Problem Relation Age of Onset  . Arthritis Mother   . Cancer Father        prostate  . Hyperlipidemia Father    . Alcohol abuse Father   . Arthritis Father   . Heart disease Father   . Stroke Father   . Hypertension Father   . Diabetes Father   . Cancer Sister        ovary  . Arthritis Sister   . Cancer Brother        thyroid  . Arthritis Brother   . Cancer Sister        breast  . Arthritis Sister   . Breast cancer Sister 67  . Arthritis Maternal Grandmother   . Arthritis Maternal Grandfather    Social History   Socioeconomic History  . Marital status: Married    Spouse name: Not on file  . Number of children: Not on file  . Years of education: Not on file  . Highest education level: Not on file  Occupational History  . Not on file  Tobacco Use  . Smoking status: Former Smoker    Packs/day: 2.00    Years: 14.00    Pack years: 28.00    Quit date: 11/08/1975    Years since quitting: 44.8  . Smokeless tobacco: Never Used  Vaping Use  . Vaping Use: Never used  Substance and Sexual Activity  . Alcohol use: No    Alcohol/week: 0.0 standard drinks  . Drug use: No  . Sexual activity: Yes    Partners: Male    Comment: Husband  Other Topics Concern  . Not on file  Social History Narrative   Retired.   Lives with husband    Grown children- 2 daughters    2 grandsons    Pets- goats and dogs    Right handed    Caffeine- 1 soda a day, decaff coffee and tea other times    Enjoys reading    Social Determinants of Health   Financial Resource Strain: Low Risk   . Difficulty of Paying Living Expenses: Not hard at all  Food Insecurity: No Food Insecurity  . Worried About Charity fundraiser in the Last Year: Never true  . Ran Out of Food in the Last Year: Never true  Transportation Needs: No Transportation Needs  . Lack of Transportation (Medical): No  . Lack of Transportation (Non-Medical): No  Physical Activity:   . Days of Exercise per Week: Not on file  . Minutes of Exercise per Session: Not on file  Stress: No Stress Concern Present  . Feeling of Stress : Not at all    Social Connections: Unknown  . Frequency of Communication with Friends and Family: Not on file  . Frequency of Social Gatherings with Friends and Family: Not on file  . Attends Religious Services: Not on file  . Active Member of Clubs or Organizations: Not on file  . Attends Archivist Meetings: Not on file  .  Marital Status: Married    Tobacco Counseling Counseling given: Not Answered   Clinical Intake:  Pre-visit preparation completed: Yes        Diabetes: No  How often do you need to have someone help you when you read instructions, pamphlets, or other written materials from your doctor or pharmacy?: 1 - Never   Interpreter Needed?: No      Activities of Daily Living In your present state of health, do you have any difficulty performing the following activities: 09/04/2020  Hearing? Y  Comment R ear hearing impairment.  Vision? N  Difficulty concentrating or making decisions? N  Walking or climbing stairs? Y  Comment Chronic knee pain. Cane in use when ambulating.  Dressing or bathing? N  Doing errands, shopping? N  Preparing Food and eating ? N  Using the Toilet? N  In the past six months, have you accidently leaked urine? N  Do you have problems with loss of bowel control? N  Managing your Medications? N  Managing your Finances? N  Housekeeping or managing your Housekeeping? N  Some recent data might be hidden    Patient Care Team: Burnard Hawthorne, FNP as PCP - General (Family Medicine)  Indicate any recent Medical Services you may have received from other than [facility] providers in the past year (date may be approximate).     Assessment:   This is a routine wellness examination for Julie Rush.  I connected with Julie Rush today by telephone and verified that I am speaking with the correct person using two identifiers. Location patient: home Location provider: work Persons participating in the virtual visit: patient, Marine scientist.    I discussed the  limitations, risks, security and privacy concerns of performing an evaluation and management service by telephone and the availability of in person appointments. The patient expressed understanding and verbally consented to this telephonic visit.    Interactive audio and video telecommunications were attempted between this provider and patient, however failed, due to patient having technical difficulties OR patient did not have access to video capability.  We continued and completed visit with audio only.  Some vital signs may be absent or patient reported.   Hearing/Vision screen  Hearing Screening   125Hz  250Hz  500Hz  1000Hz  2000Hz  3000Hz  4000Hz  6000Hz  8000Hz   Right ear:           Left ear:           Comments: R ear hearing impairment. She chooses not to wear a hearing aid at this time. Followed by ENT.   Vision Screening Comments: Wears corrective lenses  Cataract extraction, bilateral  Visual acuity not assessed, virtual visit. They have seen their ophthalmologist in the last 12 months.   Dietary issues and exercise activities discussed: Current Exercise Habits: Home exercise routine, Type of exercise: walking, Intensity: Mild  Goals      Patient Stated   .  Continue to lose weight (pt-stated)      Portion control meals       Depression Screen PHQ 2/9 Scores 09/04/2020 08/09/2019 11/20/2016 04/02/2015  PHQ - 2 Score 0 0 0 0    Fall Risk Fall Risk  09/04/2020 08/09/2019 07/23/2018 11/20/2016 04/02/2015  Falls in the past year? 0 1 No No No  Comment - - Emmi Telephone Survey: data to providers prior to load - -  Number falls in past yr: 0 0 - - -  Injury with Fall? - 1 - - -  Comment - Followed by ortho and pcp.  Physical therapy currently in session. - - -  Follow up Falls evaluation completed Falls prevention discussed;Education provided - - -   Handrails in use when climbing stairs  Home free of loose throw rugs in walkways, pet beds, electrical cords, etc? Yes  Adequate  lighting in your home to reduce risk of falls? Yes   ASSISTIVE DEVICES UTILIZED TO PREVENT FALLS: Life alert? No   Use of a cane, walker or w/c? Yes  Grab bars in the bathroom? Yes   Shower chair or bench in shower? Yes  Elevated toilet seat or a handicapped toilet? Yes   TIMED UP AND GO:  Was the test performed? No . Virtual visit.   Cognitive Function: Patient is alert and oriented x3.  Denies difficulty with focusing, memory loss, making decisions.  Enjoys brain challenging activities.       6CIT Screen 08/09/2019  What Year? 0 points  What month? 0 points  What time? 0 points  Count back from 20 0 points  Months in reverse 0 points  Repeat phrase 0 points  Total Score 0    Immunizations Immunization History  Administered Date(s) Administered  . Pneumococcal Conjugate-13 11/02/2017  . Pneumococcal Polysaccharide-23 01/21/2019  . Pneumococcal-Unspecified 09/21/2014  . Tdap 09/21/2014    Health Maintenance Health Maintenance  Topic Date Due  . HEMOGLOBIN A1C  11/20/2019  . OPHTHALMOLOGY EXAM  01/14/2020  . FOOT EXAM  01/22/2020  . URINE MICROALBUMIN  01/22/2020  . INFLUENZA VACCINE  Never done  . COVID-19 Vaccine (1) 09/20/2020 (Originally 08/01/1963)  . MAMMOGRAM  03/06/2022  . COLONOSCOPY  04/05/2022  . TETANUS/TDAP  09/21/2024  . DEXA SCAN  Completed  . Hepatitis C Screening  Completed  . PNA vac Low Risk Adult  Completed    Dental Screening: Recommended annual dental exams for proper oral hygiene.   Community Resource Referral / Chronic Care Management: CRR required this visit?  No   CCM required this visit?  No      Plan:   Keep all routine maintenance appointments.   Fasting labs 09/10/20 @ 10:15  Follow up 09/12/20 @ 1:30  I have personally reviewed and noted the following in the patient's chart:   . Medical and social history . Use of alcohol, tobacco or illicit drugs  . Current medications and supplements . Functional ability and  status . Nutritional status . Physical activity . Advanced directives . List of other physicians . Hospitalizations, surgeries, and ER visits in previous 12 months . Vitals . Screenings to include cognitive, depression, and falls . Referrals and appointments  In addition, I have reviewed and discussed with patient certain preventive protocols, quality metrics, and best practice recommendations. A written personalized care plan for preventive services as well as general preventive health recommendations were provided to patient via mychart.     Varney Biles, LPN   [id number]/46/[id number]      Agree with plan. Mable Paris, NP

## 2020-09-04 NOTE — Telephone Encounter (Signed)
Failed attempt in reaching patient for scheduled annual wellness visit. No answer. Left message to call back during allotted time or reschedule.

## 2020-09-10 ENCOUNTER — Other Ambulatory Visit (INDEPENDENT_AMBULATORY_CARE_PROVIDER_SITE_OTHER): Payer: Medicare Other

## 2020-09-10 ENCOUNTER — Other Ambulatory Visit: Payer: Self-pay

## 2020-09-10 DIAGNOSIS — I10 Essential (primary) hypertension: Secondary | ICD-10-CM | POA: Diagnosis not present

## 2020-09-10 DIAGNOSIS — E559 Vitamin D deficiency, unspecified: Secondary | ICD-10-CM | POA: Diagnosis not present

## 2020-09-10 DIAGNOSIS — E1121 Type 2 diabetes mellitus with diabetic nephropathy: Secondary | ICD-10-CM | POA: Diagnosis not present

## 2020-09-10 DIAGNOSIS — F411 Generalized anxiety disorder: Secondary | ICD-10-CM

## 2020-09-10 LAB — CBC WITH DIFFERENTIAL/PLATELET
Basophils Absolute: 0 10*3/uL (ref 0.0–0.1)
Basophils Relative: 0.8 % (ref 0.0–3.0)
Eosinophils Absolute: 0.2 10*3/uL (ref 0.0–0.7)
Eosinophils Relative: 3.2 % (ref 0.0–5.0)
HCT: 35.1 % — ABNORMAL LOW (ref 36.0–46.0)
Hemoglobin: 11.4 g/dL — ABNORMAL LOW (ref 12.0–15.0)
Lymphocytes Relative: 30.1 % (ref 12.0–46.0)
Lymphs Abs: 1.8 10*3/uL (ref 0.7–4.0)
MCHC: 32.6 g/dL (ref 30.0–36.0)
MCV: 95.6 fl (ref 78.0–100.0)
Monocytes Absolute: 0.4 10*3/uL (ref 0.1–1.0)
Monocytes Relative: 7.3 % (ref 3.0–12.0)
Neutro Abs: 3.4 10*3/uL (ref 1.4–7.7)
Neutrophils Relative %: 58.6 % (ref 43.0–77.0)
Platelets: 236 10*3/uL (ref 150.0–400.0)
RBC: 3.67 Mil/uL — ABNORMAL LOW (ref 3.87–5.11)
RDW: 13.8 % (ref 11.5–15.5)
WBC: 5.8 10*3/uL (ref 4.0–10.5)

## 2020-09-10 LAB — COMPREHENSIVE METABOLIC PANEL
ALT: 16 U/L (ref 0–35)
AST: 20 U/L (ref 0–37)
Albumin: 3.7 g/dL (ref 3.5–5.2)
Alkaline Phosphatase: 48 U/L (ref 39–117)
BUN: 13 mg/dL (ref 6–23)
CO2: 27 mEq/L (ref 19–32)
Calcium: 9.2 mg/dL (ref 8.4–10.5)
Chloride: 105 mEq/L (ref 96–112)
Creatinine, Ser: 1.1 mg/dL (ref 0.40–1.20)
GFR: 48.95 mL/min — ABNORMAL LOW (ref >60.00)
Glucose, Bld: 102 mg/dL — ABNORMAL HIGH (ref 70–99)
Potassium: 4.4 mEq/L (ref 3.5–5.1)
Sodium: 140 mEq/L (ref 135–145)
Total Bilirubin: 0.5 mg/dL (ref 0.2–1.2)
Total Protein: 6.1 g/dL (ref 6.0–8.3)

## 2020-09-10 LAB — LIPID PANEL
Cholesterol: 160 mg/dL (ref 0–200)
HDL: 40.6 mg/dL (ref >39.00)
LDL Cholesterol: 101 mg/dL — ABNORMAL HIGH (ref 0–99)
NonHDL: 119.66
Total CHOL/HDL Ratio: 4
Triglycerides: 95 mg/dL (ref 0.0–149.0)
VLDL: 19 mg/dL (ref 0.0–40.0)

## 2020-09-10 LAB — TSH: TSH: 2.79 u[IU]/mL (ref 0.35–4.50)

## 2020-09-10 LAB — VITAMIN D 25 HYDROXY (VIT D DEFICIENCY, FRACTURES): VITD: 18.47 ng/mL — ABNORMAL LOW (ref 30.00–100.00)

## 2020-09-10 LAB — HEMOGLOBIN A1C: Hgb A1c MFr Bld: 6 % (ref 4.6–6.5)

## 2020-09-11 ENCOUNTER — Ambulatory Visit: Payer: Medicare Other

## 2020-09-12 ENCOUNTER — Ambulatory Visit: Payer: Medicare Other | Admitting: Family

## 2020-09-14 ENCOUNTER — Other Ambulatory Visit: Payer: Self-pay | Admitting: Family

## 2020-09-14 ENCOUNTER — Ambulatory Visit: Payer: Medicare Other | Admitting: Family

## 2020-09-14 DIAGNOSIS — D619 Aplastic anemia, unspecified: Secondary | ICD-10-CM

## 2020-09-14 DIAGNOSIS — D539 Nutritional anemia, unspecified: Secondary | ICD-10-CM

## 2020-09-14 DIAGNOSIS — F411 Generalized anxiety disorder: Secondary | ICD-10-CM

## 2020-09-14 DIAGNOSIS — I10 Essential (primary) hypertension: Secondary | ICD-10-CM

## 2020-09-14 DIAGNOSIS — D649 Anemia, unspecified: Secondary | ICD-10-CM

## 2020-09-24 ENCOUNTER — Ambulatory Visit: Payer: Medicare Other | Admitting: Family

## 2020-09-27 ENCOUNTER — Telehealth: Payer: Self-pay

## 2020-09-27 NOTE — Telephone Encounter (Signed)
Spoke to Julie Rush. Julie Rush stated that she was told that if she found any blood in her stool, she should contact us and let us know. Patient has had blood in her stool since 09/24/20 and is experiencing pain in her abdomen. The blood is Bright red. She states that the initial bowel movement on 09/24/2020 was extremely hard, but it has gotten more loose towards todays date of 09/27/20. She has low appetite and has not eaten much so she has not had large bowel movements. She states that along with the lower abdominal pain, she is also experiencing low energy and fatigue. She has an upcoming Colonoscopy consult on 10/12/20. I recommended she go to the ED to be evaluated. She declines the ED and states that she would like to go to an Urgent care as the ED frightens her. Julie Rush verbalized understanding and states that she would go to be evaluated this morning.

## 2020-10-01 NOTE — Telephone Encounter (Signed)
FYI I spoke with patient & she stated that she did not go to ED or UC nor did she have any plans to. She was feel better with just some intermittent abdominal pain. She said that she hasn't had anymore bleeding & her stools are now softer. She said they were just really hard at time of bleeding. I offered patient an appointment with Dawson Bills, NP tomorrow since that is her specialty & since pt is still having abdominal pain. Pt scheduled at 1:30 tomorrow.

## 2020-10-01 NOTE — Telephone Encounter (Signed)
Please circle back with patient I dont see where she went to urgent care

## 2020-10-02 ENCOUNTER — Encounter: Payer: Self-pay | Admitting: Nurse Practitioner

## 2020-10-02 ENCOUNTER — Other Ambulatory Visit: Payer: Self-pay

## 2020-10-02 ENCOUNTER — Ambulatory Visit (INDEPENDENT_AMBULATORY_CARE_PROVIDER_SITE_OTHER): Payer: Medicare Other | Admitting: Nurse Practitioner

## 2020-10-02 VITALS — BP 118/72 | HR 75 | Temp 97.3°F | Ht 65.0 in | Wt 242.0 lb

## 2020-10-02 DIAGNOSIS — Z8719 Personal history of other diseases of the digestive system: Secondary | ICD-10-CM | POA: Diagnosis not present

## 2020-10-02 DIAGNOSIS — K5900 Constipation, unspecified: Secondary | ICD-10-CM

## 2020-10-02 DIAGNOSIS — R1032 Left lower quadrant pain: Secondary | ICD-10-CM | POA: Diagnosis not present

## 2020-10-02 DIAGNOSIS — R1031 Right lower quadrant pain: Secondary | ICD-10-CM | POA: Diagnosis not present

## 2020-10-02 NOTE — Patient Instructions (Addendum)
To the lab today.   If you change your mind about getting a CT scan to evaluate the lower abdominal pain that is slowly improving.   For constipation, begin on docusate sodium which is Colace sold over-the-counter.  This is a stool softener.  This may make your stools easier to pass.  For the history of rectal bleeding, you may take Preparation H suppositories that have steroid in it and insert that in the rectum at bedtime. This helps to heal any potential internal hemorrhoids that are present and may be contributing to the blood that you saw with your bowel movement.  It is very important for you to see the gastroenterologist in 10 days as planned for follow-up.  If you develop any more episodes of severe pain, or active rectal bleeding in the meantime, please seek care at the emergency department for further evaluation and management.      Constipation, Adult Constipation is when a person has fewer bowel movements in a week than normal, has difficulty having a bowel movement, or has stools that are dry, hard, or larger than normal. Constipation may be caused by an underlying condition. It may become worse with age if a person takes certain medicines and does not take in enough fluids. Follow these instructions at home: Eating and drinking   Eat foods that have a lot of fiber, such as fresh fruits and vegetables, whole grains, and beans.  Limit foods that are high in fat, low in fiber, or overly processed, such as french fries, hamburgers, cookies, candies, and soda.  Drink enough fluid to keep your urine clear or pale yellow. General instructions  Exercise regularly or as told by your health care provider.  Go to the restroom when you have the urge to go. Do not hold it in.  Take over-the-counter and prescription medicines only as told by your health care provider. These include any fiber supplements.  Practice pelvic floor retraining exercises, such as deep breathing while  relaxing the lower abdomen and pelvic floor relaxation during bowel movements.  Watch your condition for any changes.  Keep all follow-up visits as told by your health care provider. This is important. Contact a health care provider if:  You have pain that gets worse.  You have a fever.  You do not have a bowel movement after 4 days.  You vomit.  You are not hungry.  You lose weight.  You are bleeding from the anus.  You have thin, pencil-like stools. Get help right away if:  You have a fever and your symptoms suddenly get worse.  You leak stool or have blood in your stool.  Your abdomen is bloated.  You have severe pain in your abdomen.  You feel dizzy or you faint. This information is not intended to replace advice given to you by your health care provider. Make sure you discuss any questions you have with your health care provider. Document Revised: 11/20/2017 Document Reviewed: 05/28/2016 Elsevier Patient Education  [place].  Abdominal Pain, Adult Pain in the abdomen (abdominal pain) can be caused by many things. Often, abdominal pain is not serious and it gets better with no treatment or by being treated at home. However, sometimes abdominal pain is serious. Your health care provider will ask questions about your medical history and do a physical exam to try to determine the cause of your abdominal pain. Follow these instructions at home:  Medicines  Take over-the-counter and prescription medicines only as told by  your health care provider.  Do not take a laxative unless told by your health care provider. General instructions  Watch your condition for any changes.  Drink enough fluid to keep your urine pale yellow.  Keep all follow-up visits as told by your health care provider. This is important. Contact a health care provider if:  Your abdominal pain changes or gets worse.  You are not hungry or you lose weight without trying.  You are  constipated or have diarrhea for more than 2-3 days.  You have pain when you urinate or have a bowel movement.  Your abdominal pain wakes you up at night.  Your pain gets worse with meals, after eating, or with certain foods.  You are vomiting and cannot keep anything down.  You have a fever.  You have blood in your urine. Get help right away if:  Your pain does not go away as soon as your health care provider told you to expect.  You cannot stop vomiting.  Your pain is only in areas of the abdomen, such as the right side or the left lower portion of the abdomen. Pain on the right side could be caused by appendicitis.  You have bloody or black stools, or stools that look like tar.  You have severe pain, cramping, or bloating in your abdomen.  You have signs of dehydration, such as: ? Dark urine, very little urine, or no urine. ? Cracked lips. ? Dry mouth. ? Sunken eyes. ? Sleepiness. ? Weakness.  You have trouble breathing or chest pain. Summary  Often, abdominal pain is not serious and it gets better with no treatment or by being treated at home. However, sometimes abdominal pain is serious.  Watch your condition for any changes.  Take over-the-counter and prescription medicines only as told by your health care provider.  Contact a health care provider if your abdominal pain changes or gets worse.  Get help right away if you have severe pain, cramping, or bloating in your abdomen. This information is not intended to replace advice given to you by your health care provider. Make sure you discuss any questions you have with your health care provider. Document Revised: 04/18/2019 Document Reviewed: 04/18/2019 Elsevier Patient Education  [place].

## 2020-10-02 NOTE — Progress Notes (Signed)
Established Patient Office Visit  Subjective:  Patient ID: Julie Rush, female    DOB: 1951-04-19  Age: 69 y.o. MRN: [id number]  CC:  Chief Complaint  Patient presents with  . Acute Visit    abdominal pain and blood in stool    HPI Julie Rush is a 69 yo who presents for abdominal pain, with a bowel movement and passage of BRBPR  onset 09/24/2020.  Abdominal pain is improving and she has a GI appointment arranged for 10/11/20.   Patient reports she had severe bilateral lower abdominal pain that hurt so bad she was sweaty, felt like she needed a BM and strained to pass 2 solid bowel movements.then, she had 2 loose stools.  She saw fresh bright red blood on the tissue in the commode small amounts.  Over the next couple days this yeast, and her lower abdominal pain continue to improve.  She skipped a bowel movement from Thursday to Monday and passed stool on Monday that was brown, soft  no blood was seen.  She has tried no stool softeners or laxative.    Today her stomach feels sore, not painful, feels like it is resolving.  She has seen no further rectal bleeding. She has had chronic constipation, worse in the last 3 months, with rectal straining and pain with defecation.  This was the first time she has seen blood. No rectal pain, burning, itching, or obvious hemorrhoids.    Past Medical History:  Diagnosis Date  . Anginal pain ([place])   . Arthritis    "knees, back, fingers, shoulders, neck"  . Chronic lower back pain   . Depression 06/08/12   "related to not getting definitive dx of what's wrong"  . Exertional dyspnea   . GERD (gastroesophageal reflux disease)   . H/O hiatal hernia   . Headache(784.0)   . History of stomach ulcers 1980's  . Hypertension   . Migraines    4-5x/yr  . Pneumonia   . PONV (postoperative nausea and vomiting)   . Seasonal asthma   . Sleep apnea 2013   "dx'd borderline"  . Type II diabetes mellitus ([place]) 2013   "borderline"  . Vertigo     last episode over 6 mos ago    Past Surgical History:  Procedure Laterality Date  . APPENDECTOMY  1960's  . BREAST BIOPSY     right  . CATARACT EXTRACTION W/PHACO Left 11/01/2018   Procedure: CATARACT EXTRACTION PHACO AND INTRAOCULAR LENS PLACEMENT ([place]) LEFT;  Surgeon: Eulogio Bear, MD;  Location: [place];  Service: Ophthalmology;  Laterality: Left;  . CATARACT EXTRACTION W/PHACO Right 11/29/2018   Procedure: CATARACT EXTRACTION PHACO AND INTRAOCULAR LENS PLACEMENT ([place]) RIGHT;  Surgeon: Eulogio Bear, MD;  Location: [place];  Service: Ophthalmology;  Laterality: Right;  Diabetes - diet conrolled sleep apnea  . CHOLECYSTECTOMY  2006  . EXPLORATORY LAPAROTOMY  late 1990's   "blocked colon S/P hernia repair"  . HERNIA REPAIR     "@ least 3 abdominal hernia repairs"  . [place]  . SHOULDER ARTHROSCOPY WITH ROTATOR CUFF REPAIR AND SUBACROMIAL DECOMPRESSION Right 05/25/2019   Procedure: Right shoulder mini open rotator cuff repair, subacromial decompression;  Surgeon: Susa Day, MD;  Location: [facility];  Service: Orthopedics;  Laterality: Right;  90 mins  . VAGINAL HYSTERECTOMY  1981    Family History  Problem Relation Age of Onset  . Arthritis Mother   . Cancer Father  prostate  . Hyperlipidemia Father   . Alcohol abuse Father   . Arthritis Father   . Heart disease Father   . Stroke Father   . Hypertension Father   . Diabetes Father   . Cancer Sister        ovary  . Arthritis Sister   . Cancer Brother        thyroid  . Arthritis Brother   . Cancer Sister        breast  . Arthritis Sister   . Breast cancer Sister 22  . Arthritis Maternal Grandmother   . Arthritis Maternal Grandfather     Social History   Socioeconomic History  . Marital status: Married    Spouse name: Not on file  . Number of children: Not on file  . Years of education: Not on file  . Highest education level: Not on file  Occupational  History  . Not on file  Tobacco Use  . Smoking status: Former Smoker    Packs/day: 2.00    Years: 14.00    Pack years: 28.00    Quit date: 11/08/1975    Years since quitting: 44.9  . Smokeless tobacco: Never Used  Vaping Use  . Vaping Use: Never used  Substance and Sexual Activity  . Alcohol use: No    Alcohol/week: 0.0 standard drinks  . Drug use: No  . Sexual activity: Yes    Partners: Male    Comment: Husband  Other Topics Concern  . Not on file  Social History Narrative   Retired.   Lives with husband    Grown children- 2 daughters    2 grandsons    Pets- goats and dogs    Right handed    Caffeine- 1 soda a day, decaff coffee and tea other times    Enjoys reading    Social Determinants of Health   Financial Resource Strain: Low Risk   . Difficulty of Paying Living Expenses: Not hard at all  Food Insecurity: No Food Insecurity  . Worried About Charity fundraiser in the Last Year: Never true  . Ran Out of Food in the Last Year: Never true  Transportation Needs: No Transportation Needs  . Lack of Transportation (Medical): No  . Lack of Transportation (Non-Medical): No  Physical Activity:   . Days of Exercise per Week: Not on file  . Minutes of Exercise per Session: Not on file  Stress: No Stress Concern Present  . Feeling of Stress : Not at all  Social Connections: Unknown  . Frequency of Communication with Friends and Family: Not on file  . Frequency of Social Gatherings with Friends and Family: Not on file  . Attends Religious Services: Not on file  . Active Member of Clubs or Organizations: Not on file  . Attends Archivist Meetings: Not on file  . Marital Status: Married  Human resources officer Violence: Not At Risk  . Fear of Current or Ex-Partner: No  . Emotionally Abused: No  . Physically Abused: No  . Sexually Abused: No    Outpatient Medications Prior to Visit  Medication Sig Dispense Refill  . acetaminophen (TYLENOL) 500 MG tablet Take  1,000 mg by mouth every 6 (six) hours as needed for moderate pain or headache.     . albuterol (PROVENTIL HFA) 108 (90 Base) MCG/ACT inhaler Inhale 2 puffs into the lungs every 6 (six) hours as needed for wheezing or shortness of breath. 1 Inhaler 1  . aspirin  EC 81 MG tablet Take 1 tablet (81 mg total) daily by mouth. 90 tablet 3  . b complex vitamins tablet Take 2 tablets by mouth daily.    Marland Kitchen buPROPion (WELLBUTRIN SR) 150 MG 12 hr tablet TAKE 1 TABLET BY MOUTH TWICE A DAY 180 tablet 1  . methocarbamol (ROBAXIN) 500 MG tablet Take 1 tablet (500 mg total) by mouth every 8 (eight) hours as needed for muscle spasms. 20 tablet 1  . metoprolol succinate (TOPROL-XL) 50 MG 24 hr tablet TAKE 1 TABLET BY MOUTH EVERY MORNING WITH OR IMMEDIATELY FOLLOWING A MEAL 90 tablet 0  . omeprazole (PRILOSEC) 20 MG capsule Take 20 mg by mouth daily as needed (heartburn).     . triamcinolone cream (KENALOG) 0.1 % Apply 1 application topically 2 (two) times daily as needed for itching.     No facility-administered medications prior to visit.    Allergies  Allergen Reactions  . Codeine Nausea And Vomiting  . Prednisone Palpitations  . Sulfa Antibiotics Itching and Rash  . Trimethoprim Hives    ROS Review of Systems  Constitutional: Negative for chills and fever.  Respiratory: Negative for cough and shortness of breath.   Cardiovascular: Negative for chest pain and palpitations.  Gastrointestinal: Positive for abdominal pain, blood in stool and constipation. Negative for abdominal distention, diarrhea, nausea, rectal pain and vomiting.  Genitourinary: Negative for difficulty urinating.  Neurological: Negative.       Objective:    Physical Exam Vitals reviewed. Exam conducted with a chaperone present.  Constitutional:      Appearance: She is obese.  HENT:     Head: Normocephalic.  Cardiovascular:     Rate and Rhythm: Normal rate and regular rhythm.     Pulses: Normal pulses.     Heart sounds: Normal  heart sounds.  Pulmonary:     Effort: Pulmonary effort is normal.     Breath sounds: Normal breath sounds.  Abdominal:     General: There is no distension.     Palpations: Abdomen is soft. There is no mass.     Tenderness: There is abdominal tenderness. There is no guarding or rebound.     Comments: Lower abdomen bilateral tender with deep palpation.   Genitourinary:    Rectum: Normal. Guaiac result positive. No mass, tenderness, anal fissure or external hemorrhoid. Normal anal tone.  Musculoskeletal:        General: Normal range of motion.     Cervical back: Normal range of motion and neck supple.  Skin:    General: Skin is warm and dry.  Neurological:     General: No focal deficit present.     Mental Status: She is alert and oriented to person, place, and time.  Psychiatric:        Mood and Affect: Mood normal.        Behavior: Behavior normal.    BP 118/72 (BP Location: Left Arm, Patient Position: Sitting, Cuff Size: Large)   Pulse 75   Temp (!) 97.3 F (36.3 C) (Oral)   Ht '5\' 5"'  (1.651 m)   Wt 242 lb (109.8 kg)   SpO2 97%   BMI 40.27 kg/m  Wt Readings from Last 3 Encounters:  10/02/20 242 lb (109.8 kg)  09/04/20 243 lb (110.2 kg)  05/25/19 262 lb 4 oz (119 kg)     Health Maintenance Due  Topic Date Due  . URINE MICROALBUMIN  01/22/2020    There are no preventive care reminders to display for  this patient.  Lab Results  Component Value Date   TSH 2.79 09/10/2020   Lab Results  Component Value Date   WBC 8.0 10/02/2020   HGB 11.6 (L) 10/02/2020   HCT 35.6 (L) 10/02/2020   MCV 95.4 10/02/2020   PLT 299.0 10/02/2020   Lab Results  Component Value Date   NA 138 10/02/2020   K 3.9 10/02/2020   CO2 28 10/02/2020   GLUCOSE 98 10/02/2020   BUN 11 10/02/2020   CREATININE 1.11 10/02/2020   BILITOT 0.5 10/02/2020   ALKPHOS 57 10/02/2020   AST 17 10/02/2020   ALT 11 10/02/2020   PROT 6.2 10/02/2020   ALBUMIN 3.7 10/02/2020   CALCIUM 9.2 10/02/2020    ANIONGAP 8 05/26/2019   GFR 49.86 (L) 10/02/2020   Lab Results  Component Value Date   CHOL 160 09/10/2020   Lab Results  Component Value Date   HDL 40.60 09/10/2020   Lab Results  Component Value Date   LDLCALC 101 (H) 09/10/2020   Lab Results  Component Value Date   TRIG 95.0 09/10/2020   Lab Results  Component Value Date   CHOLHDL 4 09/10/2020   Lab Results  Component Value Date   HGBA1C 6.0 09/10/2020      Assessment & Plan:   Problem List Items Addressed This Visit      Other   Bilateral lower abdominal pain - Primary   Relevant Orders   CBC with Differential/Platelet (Completed)   Comp Met (CMET) (Completed)   Constipation   History of rectal bleeding   Relevant Orders   CBC with Differential/Platelet (Completed)     Patient presents with acute episode of lower abdominal pain onset 09/24/20  that she describes as quite severe while having hard bowel movements with bright red blood.  Abdominal pain is resolving daily and described now as soreness.  Digital rectal exam shows brown mucus, trace blood.  No evidence of hemorrhoids or fissure or palpable mass.  There is formed stool in the vault.  Her abdomen exam is soft, and tender with deep palpation bilateral.  Differential diagnosis to include constipation, lower outlet bleed, diverticulosis, irritable bowel, malignancy.  We discussed getting a CT of the abdomen and pelvis to evaluate for these problems.  She reports she is getting better every day and declines the  CT study and prefers to wait to see Gastroenterology next week.  We will check labs today CBC to make sure her hemoglobin is stable.  She has had a history of anemia.  Also check a chemistry panel to BUN/ creatinine. She was advised treatment for constipation, possible internal hemorrhoids.  Preparation H for potential hemorrhoids internal hemorrhoids, and advised to follow-up with GI as planned.  Alarm symptoms reviewed for need for urgent  evaluation.  No orders of the defined types were placed in this encounter.  Follow-up: Return if symptoms worsen or fail to improve.   This visit occurred during the SARS-CoV-2 public health emergency.  Safety protocols were in place, including screening questions prior to the visit, additional usage of staff PPE, and extensive cleaning of exam room while observing appropriate contact time as indicated for disinfecting solutions.   Denice Paradise, NP

## 2020-10-03 ENCOUNTER — Encounter: Payer: Self-pay | Admitting: Nurse Practitioner

## 2020-10-03 LAB — COMPREHENSIVE METABOLIC PANEL
ALT: 11 U/L (ref 0–35)
AST: 17 U/L (ref 0–37)
Albumin: 3.7 g/dL (ref 3.5–5.2)
Alkaline Phosphatase: 57 U/L (ref 39–117)
BUN: 11 mg/dL (ref 6–23)
CO2: 28 mEq/L (ref 19–32)
Calcium: 9.2 mg/dL (ref 8.4–10.5)
Chloride: 102 mEq/L (ref 96–112)
Creatinine, Ser: 1.11 mg/dL (ref 0.40–1.20)
GFR: 49.86 mL/min — ABNORMAL LOW (ref >60.00)
Glucose, Bld: 98 mg/dL (ref 70–99)
Potassium: 3.9 mEq/L (ref 3.5–5.1)
Sodium: 138 mEq/L (ref 135–145)
Total Bilirubin: 0.5 mg/dL (ref 0.2–1.2)
Total Protein: 6.2 g/dL (ref 6.0–8.3)

## 2020-10-03 LAB — CBC WITH DIFFERENTIAL/PLATELET
Basophils Absolute: 0.1 10*3/uL (ref 0.0–0.1)
Basophils Relative: 1.2 % (ref 0.0–3.0)
Eosinophils Absolute: 0.2 10*3/uL (ref 0.0–0.7)
Eosinophils Relative: 2.3 % (ref 0.0–5.0)
HCT: 35.6 % — ABNORMAL LOW (ref 36.0–46.0)
Hemoglobin: 11.6 g/dL — ABNORMAL LOW (ref 12.0–15.0)
Lymphocytes Relative: 28.3 % (ref 12.0–46.0)
Lymphs Abs: 2.3 10*3/uL (ref 0.7–4.0)
MCHC: 32.7 g/dL (ref 30.0–36.0)
MCV: 95.4 fl (ref 78.0–100.0)
Monocytes Absolute: 0.5 10*3/uL (ref 0.1–1.0)
Monocytes Relative: 5.7 % (ref 3.0–12.0)
Neutro Abs: 5 10*3/uL (ref 1.4–7.7)
Neutrophils Relative %: 62.5 % (ref 43.0–77.0)
Platelets: 299 10*3/uL (ref 150.0–400.0)
RBC: 3.73 Mil/uL — ABNORMAL LOW (ref 3.87–5.11)
RDW: 13.5 % (ref 11.5–15.5)
WBC: 8 10*3/uL (ref 4.0–10.5)

## 2020-10-05 ENCOUNTER — Telehealth: Payer: Self-pay | Admitting: Nurse Practitioner

## 2020-10-05 MED ORDER — CIPROFLOXACIN HCL 500 MG PO TABS: 500.0000 mg | ORAL_TABLET | Freq: Two times a day (BID) | ORAL | 0 refills | Status: DC

## 2020-10-05 MED ORDER — SACCHAROMYCES BOULARDII 250 MG PO CAPS: 250.0000 mg | ORAL_CAPSULE | Freq: Two times a day (BID) | ORAL | 0 refills | Status: AC

## 2020-10-05 MED ORDER — METRONIDAZOLE 500 MG PO TABS: 500.0000 mg | ORAL_TABLET | Freq: Three times a day (TID) | ORAL | 0 refills | Status: DC

## 2020-10-05 NOTE — Telephone Encounter (Signed)
She is having lower abdominal ache when walking- intermittent - ache-mild and it gets more painful with BM. After BM it is still mildly present, but less painful until next BM. She had a BM  Tues, Wed and no BM since. Blood is mixed in with the stool- not on tissue , but in the commode. On Wed, she passed just 1/4 cup blood. No BM- which is her pattern. She is eating and drinking well. No new dizzy/lightheaded feeling. Ever since she had Covid in Jan- she has not been herself.  She feels weak- as on Tues. Hgb stable 11.6.   Plan: Advised CT scan and ER visit. She wants to wait for GI appt next week. I am concerned about diverticulitis, diverticulosis with internal hemorrhoids, colitis, ischemic colitis. Patient agrees to starting antibiotics now, and follow-up telephone call on Monday. I have encouraged CT scan on Monday. She was advised if symptoms worsen over the weekend to present to the emergency department. She voices understanding and is in agreement.  4 prescriptions called in: Cipro 500 mg twice daily for 7 days metronidazole 500 mg 3 times daily for 10 days  Florastor 250 mg twice daily for 14 days Allergic to prednisone- will hold on Anusol   Jessica: Please call for symptom update on Monday

## 2020-10-05 NOTE — Telephone Encounter (Signed)
-----   Message from Filbert Berthold, [place] sent at 10/05/2020  4:44 PM EDT ----- Patient states she does still have some pain but no bleeding yet as she has not had a bowel movement today. Advised to go to urgent care if pain and bleeding continues; patient agrees to do so.

## 2020-10-08 NOTE — Telephone Encounter (Signed)
Patient states she is feeling much better; no more pain and has not seen anymore blood in stool. Patient never started the medication that was sent in because by the time she picked them up she was feeling better.

## 2020-10-12 ENCOUNTER — Telehealth: Payer: Self-pay | Admitting: Family

## 2020-10-12 DIAGNOSIS — Z8719 Personal history of other diseases of the digestive system: Secondary | ICD-10-CM

## 2020-10-12 NOTE — Telephone Encounter (Signed)
Call pt rec'ed fax from [facility] of ferritin ( iron stores) which are normal, however low end.  Please make f/u with me to discuss anemia as seen on labs  10/02/20  Per notes, she was supposed to have GI appt; does oes she have GI appointment scheduled?

## 2020-10-12 NOTE — Telephone Encounter (Signed)
Pt saw Dr. Collene Mares in [facility] yesterday for a consultation. She did not go into detail but said that no colonoscopy was ordered. She wants tpo get a second opinion so she is going to go elsewhere. Per patient with her insurance she does not need a referral.  Pt has f/u with you December 17th.

## 2020-10-12 NOTE — Telephone Encounter (Signed)
Patient message back stating that she has an appointment with Reece Levy, PA. They do requite a referral & asked if you would place that for her.

## 2020-10-15 NOTE — Telephone Encounter (Signed)
I called & made patient aware that referral was placed. I asked that she let us know if any issues.

## 2020-10-15 NOTE — Telephone Encounter (Signed)
Call pt I have placed ref to [facility] , mason crowley Please ensure that has everything taken are of

## 2020-10-26 ENCOUNTER — Other Ambulatory Visit
Admission: RE | Admit: 2020-10-26 | Discharge: 2020-10-26 | Disposition: A | Discharge status: Home / Self Care | Payer: Medicare Other | Source: Ambulatory Visit | Attending: Gastroenterology | Admitting: Gastroenterology

## 2020-10-26 ENCOUNTER — Other Ambulatory Visit: Payer: Self-pay

## 2020-10-26 DIAGNOSIS — Z01812 Encounter for preprocedural laboratory examination: Secondary | ICD-10-CM | POA: Diagnosis present

## 2020-10-26 DIAGNOSIS — Z20822 Contact with and (suspected) exposure to covid-19: Secondary | ICD-10-CM | POA: Insufficient documentation

## 2020-10-26 LAB — SARS CORONAVIRUS 2 (TAT 6-24 HRS): SARS Coronavirus 2: NEGATIVE

## 2020-10-29 ENCOUNTER — Encounter: Payer: Self-pay | Admitting: *Deleted

## 2020-10-30 ENCOUNTER — Ambulatory Visit
Admission: RE | Admit: 2020-10-30 | Discharge: 2020-10-30 | Disposition: A | Discharge status: Home / Self Care | Payer: Medicare Other | Attending: Gastroenterology | Admitting: Gastroenterology

## 2020-10-30 ENCOUNTER — Encounter: Payer: Self-pay | Admitting: *Deleted

## 2020-10-30 ENCOUNTER — Encounter
Admission: RE | Disposition: A | Discharge status: Home / Self Care | Payer: Self-pay | Source: Home / Self Care | Attending: Gastroenterology

## 2020-10-30 ENCOUNTER — Ambulatory Visit: Payer: Medicare Other | Admitting: Certified Registered Nurse Anesthetist

## 2020-10-30 ENCOUNTER — Other Ambulatory Visit: Payer: Self-pay

## 2020-10-30 DIAGNOSIS — Z7982 Long term (current) use of aspirin: Secondary | ICD-10-CM | POA: Insufficient documentation

## 2020-10-30 DIAGNOSIS — Z888 Allergy status to other drugs, medicaments and biological substances status: Secondary | ICD-10-CM | POA: Diagnosis not present

## 2020-10-30 DIAGNOSIS — Z87891 Personal history of nicotine dependence: Secondary | ICD-10-CM | POA: Diagnosis not present

## 2020-10-30 DIAGNOSIS — K648 Other hemorrhoids: Secondary | ICD-10-CM | POA: Diagnosis not present

## 2020-10-30 DIAGNOSIS — K640 First degree hemorrhoids: Secondary | ICD-10-CM | POA: Insufficient documentation

## 2020-10-30 DIAGNOSIS — K921 Melena: Secondary | ICD-10-CM | POA: Insufficient documentation

## 2020-10-30 DIAGNOSIS — R1031 Right lower quadrant pain: Secondary | ICD-10-CM | POA: Diagnosis not present

## 2020-10-30 DIAGNOSIS — Z79899 Other long term (current) drug therapy: Secondary | ICD-10-CM | POA: Diagnosis not present

## 2020-10-30 DIAGNOSIS — K5730 Diverticulosis of large intestine without perforation or abscess without bleeding: Secondary | ICD-10-CM | POA: Diagnosis not present

## 2020-10-30 DIAGNOSIS — K5909 Other constipation: Secondary | ICD-10-CM | POA: Diagnosis not present

## 2020-10-30 DIAGNOSIS — Z882 Allergy status to sulfonamides status: Secondary | ICD-10-CM | POA: Diagnosis not present

## 2020-10-30 DIAGNOSIS — K219 Gastro-esophageal reflux disease without esophagitis: Secondary | ICD-10-CM | POA: Diagnosis not present

## 2020-10-30 DIAGNOSIS — Z885 Allergy status to narcotic agent status: Secondary | ICD-10-CM | POA: Diagnosis not present

## 2020-10-30 DIAGNOSIS — K5900 Constipation, unspecified: Secondary | ICD-10-CM | POA: Diagnosis present

## 2020-10-30 DIAGNOSIS — R1030 Lower abdominal pain, unspecified: Secondary | ICD-10-CM | POA: Diagnosis not present

## 2020-10-30 DIAGNOSIS — R1032 Left lower quadrant pain: Secondary | ICD-10-CM | POA: Diagnosis not present

## 2020-10-30 DIAGNOSIS — I10 Essential (primary) hypertension: Secondary | ICD-10-CM | POA: Insufficient documentation

## 2020-10-30 DIAGNOSIS — Z881 Allergy status to other antibiotic agents status: Secondary | ICD-10-CM | POA: Diagnosis not present

## 2020-10-30 DIAGNOSIS — E119 Type 2 diabetes mellitus without complications: Secondary | ICD-10-CM | POA: Insufficient documentation

## 2020-10-30 DIAGNOSIS — K5790 Diverticulosis of intestine, part unspecified, without perforation or abscess without bleeding: Secondary | ICD-10-CM | POA: Diagnosis not present

## 2020-10-30 HISTORY — PX: COLONOSCOPY WITH PROPOFOL: SHX5780

## 2020-10-30 LAB — HM COLONOSCOPY

## 2020-10-30 SURGERY — COLONOSCOPY WITH PROPOFOL
Anesthesia: General

## 2020-10-30 MED ORDER — EPHEDRINE 5 MG/ML INJ
INTRAVENOUS | Status: AC
  Filled 2020-10-30: qty 10

## 2020-10-30 MED ORDER — ONDANSETRON HCL 4 MG/2ML IJ SOLN
INTRAMUSCULAR | Status: AC
  Filled 2020-10-30: qty 2

## 2020-10-30 MED ORDER — SODIUM CHLORIDE 0.9 % IV SOLN: INTRAVENOUS | Status: DC

## 2020-10-30 MED ORDER — PROPOFOL 500 MG/50ML IV EMUL
INTRAVENOUS | Status: DC | PRN
  Administered 2020-10-30: 125 ug/kg/min via INTRAVENOUS

## 2020-10-30 MED ORDER — ONDANSETRON HCL 4 MG/2ML IJ SOLN
INTRAMUSCULAR | Status: DC | PRN
  Administered 2020-10-30: 4 mg via INTRAVENOUS

## 2020-10-30 MED ORDER — LIDOCAINE HCL (CARDIAC) PF 100 MG/5ML IV SOSY
PREFILLED_SYRINGE | INTRAVENOUS | Status: DC | PRN
  Administered 2020-10-30: 100 mg via INTRAVENOUS

## 2020-10-30 MED ORDER — PROPOFOL 10 MG/ML IV BOLUS
INTRAVENOUS | Status: DC | PRN
  Administered 2020-10-30: 30 mg via INTRAVENOUS
  Administered 2020-10-30: 20 mg via INTRAVENOUS
  Administered 2020-10-30 (×3): 10 mg via INTRAVENOUS

## 2020-10-30 MED ORDER — GLYCOPYRROLATE 0.2 MG/ML IJ SOLN
INTRAMUSCULAR | Status: AC
  Filled 2020-10-30: qty 1

## 2020-10-30 NOTE — Transfer of Care (Signed)
Immediate Anesthesia Transfer of Care Note  Patient: LACHAE HOHLER  Procedure(s) Performed: COLONOSCOPY WITH PROPOFOL (N/A )  Patient Location: PACU and Endoscopy Unit  Anesthesia Type:General  Level of Consciousness: awake, alert  and oriented  Airway & Oxygen Therapy: Patient Spontanous Breathing  Post-op Assessment: Report given to RN and Post -op Vital signs reviewed and stable  Post vital signs: Reviewed and stable  Last Vitals:  Vitals Value Taken Time  BP 112/91 10/30/20 1023  Temp    Pulse 81 10/30/20 1024  Resp 21 10/30/20 1024  SpO2 97 % 10/30/20 1024  Vitals shown include unvalidated device data.  Last Pain:  Vitals:   10/30/20 0934  TempSrc: Oral  PainSc: 0-No pain         Complications: No complications documented.

## 2020-10-30 NOTE — Op Note (Signed)
[facility] Gastroenterology Patient Name: Julie Rush Procedure Date: 10/30/2020 9:34 AM MRN: [id number] Account #: [id number] Date of Birth: 1951/08/16 Admit Type: Outpatient Age: 69 Room: [facility] ENDO ROOM 1 Gender: Female Note Status: Finalized Procedure:             Colonoscopy Indications:           Lower abdominal pain, Hematochezia, Constipation Providers:             Andrey Farmer MD, MD Referring MD:          Yvetta Coder. Arnett (Referring MD) Medicines:             Monitored Anesthesia Care Complications:         No immediate complications. Procedure:             Pre-Anesthesia Assessment:                        - Prior to the procedure, a History and Physical was                         performed, and patient medications and allergies were                         reviewed. The patient is competent. The risks and                         benefits of the procedure and the sedation options and                         risks were discussed with the patient. All questions                         were answered and informed consent was obtained.                         Patient identification and proposed procedure were                         verified by the physician, the nurse, the anesthetist                         and the technician in the endoscopy suite. Mental                         Status Examination: alert and oriented. Airway                         Examination: normal oropharyngeal airway and neck                         mobility. Respiratory Examination: clear to                         auscultation. CV Examination: normal. Prophylactic                         Antibiotics: The patient does not require prophylactic  antibiotics. Prior Anticoagulants: The patient has                         taken no previous anticoagulant or antiplatelet                         agents. ASA Grade Assessment: III - A patient with                          severe systemic disease. After reviewing the risks and                         benefits, the patient was deemed in satisfactory                         condition to undergo the procedure. The anesthesia                         plan was to use monitored anesthesia care (MAC).                         Immediately prior to administration of medications,                         the patient was re-assessed for adequacy to receive                         sedatives. The heart rate, respiratory rate, oxygen                         saturations, blood pressure, adequacy of pulmonary                         ventilation, and response to care were monitored                         throughout the procedure. The physical status of the                         patient was re-assessed after the procedure.                        After obtaining informed consent, the colonoscope was                         passed under direct vision. Throughout the procedure,                         the patient's blood pressure, pulse, and oxygen                         saturations were monitored continuously. The                         Colonoscope was introduced through the anus and                         advanced to the the cecum, identified by appendiceal  orifice and ileocecal valve. The colonoscopy was                         technically difficult and complex due to significant                         looping. Successful completion of the procedure was                         aided by applying abdominal pressure. The patient                         tolerated the procedure well. The quality of the bowel                         preparation was good. Findings:      The perianal and digital rectal examinations were normal.      A few small-mouthed diverticula were found in the sigmoid colon.      Non-bleeding internal hemorrhoids were found during retroflexion. The       hemorrhoids were  Grade I (internal hemorrhoids that do not prolapse).      The exam was otherwise without abnormality on direct and retroflexion       views. Impression:            - Diverticulosis in the sigmoid colon.                        - Non-bleeding internal hemorrhoids.                        - The examination was otherwise normal on direct and                         retroflexion views.                        - No specimens collected. Recommendation:        - Discharge patient to home.                        - Resume previous diet.                        - Continue present medications.                        - Repeat colonoscopy for surveillance to be determined                         after previous colonoscopy report reviewed.                        - Return to referring physician as previously                         scheduled. Procedure Code(s):     --- Professional ---                        [phone], Colonoscopy, flexible; diagnostic, including  collection of specimen(s) by brushing or washing, when                         performed (separate procedure) Diagnosis Code(s):     --- Professional ---                        K64.0, First degree hemorrhoids                        R10.30, Lower abdominal pain, unspecified                        K92.1, Melena (includes Hematochezia)                        K59.00, Constipation, unspecified                        K57.30, Diverticulosis of large intestine without                         perforation or abscess without bleeding CPT copyright 2019 American Medical Association. All rights reserved. The codes documented in this report are preliminary and upon coder review may  be revised to meet current compliance requirements. Andrey Farmer, MD Andrey Farmer MD, MD 10/30/2020 10:20:24 AM Number of Addenda: 0 Note Initiated On: 10/30/2020 9:34 AM Scope Withdrawal Time: 0 hours 5 minutes 51 seconds  Total Procedure Duration: 0  hours 18 minutes 34 seconds  Estimated Blood Loss:  Estimated blood loss: none.      [facility]

## 2020-10-30 NOTE — Interval H&P Note (Signed)
History and Physical Interval Note:  10/30/2020 9:48 AM  Julie Rush  has presented today for surgery, with the diagnosis of bil lower abd pain chronic constipation hematochezia.  The various methods of treatment have been discussed with the patient and family. After consideration of risks, benefits and other options for treatment, the patient has consented to  Procedure(s): COLONOSCOPY WITH PROPOFOL (N/A) as a surgical intervention.  The patient's history has been reviewed, patient examined, no change in status, stable for surgery.  I have reviewed the patient's chart and labs.  Questions were answered to the patient's satisfaction.     Lesly Rubenstein  Ok to proceed with colonoscopy

## 2020-10-30 NOTE — Anesthesia Procedure Notes (Signed)
Procedure Name: MAC Date/Time: 10/30/2020 9:59 AM Performed by: Lily Peer, Jen Eppinger, CRNA Pre-anesthesia Checklist: Patient identified, Suction available, Emergency Drugs available and Patient being monitored Patient Re-evaluated:Patient Re-evaluated prior to induction Oxygen Delivery Method: Nasal cannula

## 2020-10-30 NOTE — Anesthesia Preprocedure Evaluation (Signed)
Anesthesia Evaluation  Patient identified by MRN, date of birth, ID band Patient awake    Reviewed: Allergy & Precautions, NPO status , Patient's Chart, lab work & pertinent test results  History of Anesthesia Complications (+) PONV and history of anesthetic complications  Airway Mallampati: II   Neck ROM: Full    Dental no notable dental hx.    Pulmonary asthma , sleep apnea , pneumonia, resolved, Patient abstained from smoking., former smoker,    Pulmonary exam normal        Cardiovascular hypertension, negative cardio ROS Normal cardiovascular exam Rhythm:Regular Rate:Normal     Neuro/Psych  Headaches, PSYCHIATRIC DISORDERS Anxiety Depression    GI/Hepatic Neg liver ROS, hiatal hernia, GERD  ,  Endo/Other  negative endocrine ROSdiabetes  Renal/GU negative Renal ROS  negative genitourinary   Musculoskeletal  (+) Arthritis , Osteoarthritis,    Abdominal   Peds negative pediatric ROS (+)  Hematology negative hematology ROS (+)   Anesthesia Other Findings   Reproductive/Obstetrics negative OB ROS                             Anesthesia Physical Anesthesia Plan  ASA: III  Anesthesia Plan: General   Post-op Pain Management:    Induction: Intravenous  PONV Risk Score and Plan: 4 or greater and Propofol infusion  Airway Management Planned: Nasal Cannula  Additional Equipment: None  Intra-op Plan:   Post-operative Plan:   Informed Consent: I have reviewed the patients History and Physical, chart, labs and discussed the procedure including the risks, benefits and alternatives for the proposed anesthesia with the patient or authorized representative who has indicated his/her understanding and acceptance.       Plan Discussed with: CRNA, Anesthesiologist and Surgeon  Anesthesia Plan Comments:         Anesthesia Quick Evaluation

## 2020-10-30 NOTE — H&P (Signed)
Outpatient short stay form Pre-procedure 10/30/2020 9:45 AM Raylene Miyamoto MD, MPH  Primary Physician: NP Arnett  Reason for visit:  Lower abdominal pain, hematochezia  History of present illness:   69 y/o lady with history hematochezia and lower abdominal pain here for colonoscopy. No family history of GI malignancies. No blood thinners. History of multiple abdominal surgeries including hysterectomy.    Current Facility-Administered Medications:  .  0.9 %  sodium chloride infusion, , Intravenous, Continuous, Thuan Tippett, Hilton Cork, MD, Last Rate: 20 mL/hr at 10/30/20 0943, New Bag at 10/30/20 0943  Medications Prior to Admission  Medication Sig Dispense Refill Last Dose  . aspirin EC 81 MG tablet Take 1 tablet (81 mg total) daily by mouth. 90 tablet 3 Past Month at Unknown time  . b complex vitamins tablet Take 2 tablets by mouth daily.   Past Month at Unknown time  . buPROPion (WELLBUTRIN SR) 150 MG 12 hr tablet TAKE 1 TABLET BY MOUTH TWICE A DAY 180 tablet 1 Past Week at Unknown time  . metoprolol succinate (TOPROL-XL) 50 MG 24 hr tablet TAKE 1 TABLET BY MOUTH EVERY MORNING WITH OR IMMEDIATELY FOLLOWING A MEAL 90 tablet 0 10/30/2020 at 0500  . triamcinolone cream (KENALOG) 0.1 % Apply 1 application topically 2 (two) times daily as needed for itching.   Past Month at Unknown time  . acetaminophen (TYLENOL) 500 MG tablet Take 1,000 mg by mouth every 6 (six) hours as needed for moderate pain or headache.      . albuterol (PROVENTIL HFA) 108 (90 Base) MCG/ACT inhaler Inhale 2 puffs into the lungs every 6 (six) hours as needed for wheezing or shortness of breath. 1 Inhaler 1   . ciprofloxacin (CIPRO) 500 MG tablet Take 1 tablet (500 mg total) by mouth 2 (two) times daily. (Patient not taking: Reported on 10/30/2020) 14 tablet 0 Not Taking at Unknown time  . methocarbamol (ROBAXIN) 500 MG tablet Take 1 tablet (500 mg total) by mouth every 8 (eight) hours as needed for muscle spasms. 20 tablet 1 2-3  months  . metroNIDAZOLE (FLAGYL) 500 MG tablet Take 1 tablet (500 mg total) by mouth 3 (three) times daily. (Patient not taking: Reported on 10/30/2020) 30 tablet 0 Not Taking at Unknown time  . omeprazole (PRILOSEC) 20 MG capsule Take 20 mg by mouth daily as needed (heartburn).    10/28/20     Allergies  Allergen Reactions  . Codeine Nausea And Vomiting  . Prednisone Palpitations  . Sulfa Antibiotics Itching and Rash  . Trimethoprim Hives     Past Medical History:  Diagnosis Date  . Anginal pain ([place])   . Arthritis    "knees, back, fingers, shoulders, neck"  . Chronic lower back pain   . Depression 06/08/12   "related to not getting definitive dx of what's wrong"  . Exertional dyspnea   . GERD (gastroesophageal reflux disease)   . H/O hiatal hernia   . Headache(784.0)   . History of stomach ulcers 1980's  . Hypertension   . Migraines    4-5x/yr  . Pneumonia   . PONV (postoperative nausea and vomiting)   . Seasonal asthma   . Sleep apnea 2013   "dx'd borderline"  . Type II diabetes mellitus ([place]) 2013   "borderline"  . Vertigo    last episode over 6 mos ago    Review of systems:  Otherwise negative.    Physical Exam  Gen: Alert, oriented. Appears stated age.  HEENT: PERRLA. Lungs: No  respiratory distress. CV: RRR Abd: soft, benign, no masses.  Ext: No edema.     Planned procedures: Proceed with colonoscopy. The patient understands the nature of the planned procedure, indications, risks, alternatives and potential complications including but not limited to bleeding, infection, perforation, damage to internal organs and possible oversedation/side effects from anesthesia. The patient agrees and gives consent to proceed.  Please refer to procedure notes for findings, recommendations and patient disposition/instructions.     Raylene Miyamoto MD, MPH Gastroenterology 10/30/2020  9:45 AM

## 2020-10-30 NOTE — Anesthesia Postprocedure Evaluation (Signed)
Anesthesia Post Note  Patient: Julie Rush  Procedure(s) Performed: COLONOSCOPY WITH PROPOFOL (N/A )  Patient location during evaluation: Endoscopy Anesthesia Type: General Level of consciousness: awake and awake and alert Pain management: pain level controlled Vital Signs Assessment: post-procedure vital signs reviewed and stable Respiratory status: spontaneous breathing and nonlabored ventilation Postop Assessment: no headache and no apparent nausea or vomiting Anesthetic complications: no   No complications documented.   Last Vitals:  Vitals:   10/30/20 0934 10/30/20 1022  BP: 140/74 (!) 112/91  Pulse: 87   Resp: 20   Temp: 36.7 C 36.5 C  SpO2: 100%     Last Pain:  Vitals:   10/30/20 1022  TempSrc: Temporal  PainSc: 0-No pain                 Neva Seat

## 2020-10-31 ENCOUNTER — Encounter: Payer: Self-pay | Admitting: Gastroenterology

## 2020-11-01 ENCOUNTER — Other Ambulatory Visit: Payer: Self-pay | Admitting: Family

## 2020-11-01 DIAGNOSIS — I10 Essential (primary) hypertension: Secondary | ICD-10-CM

## 2020-11-01 MED ORDER — METOPROLOL SUCCINATE ER 50 MG PO TB24: ORAL_TABLET | ORAL | 0 refills | Status: DC

## 2020-11-02 ENCOUNTER — Other Ambulatory Visit: Payer: Self-pay

## 2020-11-02 DIAGNOSIS — I10 Essential (primary) hypertension: Secondary | ICD-10-CM

## 2020-11-02 MED ORDER — METOPROLOL SUCCINATE ER 50 MG PO TB24: ORAL_TABLET | ORAL | 0 refills | Status: DC

## 2020-12-06 ENCOUNTER — Encounter: Payer: Self-pay | Admitting: Family

## 2020-12-07 ENCOUNTER — Other Ambulatory Visit: Payer: Self-pay

## 2020-12-07 ENCOUNTER — Encounter: Payer: Self-pay | Admitting: Family

## 2020-12-07 ENCOUNTER — Ambulatory Visit (INDEPENDENT_AMBULATORY_CARE_PROVIDER_SITE_OTHER): Payer: Medicare Other | Admitting: Family

## 2020-12-07 ENCOUNTER — Telehealth: Payer: Self-pay | Admitting: Family

## 2020-12-07 VITALS — BP 128/84 | HR 68 | Temp 97.6°F | Ht 65.0 in | Wt 255.2 lb

## 2020-12-07 DIAGNOSIS — Z8719 Personal history of other diseases of the digestive system: Secondary | ICD-10-CM

## 2020-12-07 DIAGNOSIS — R1032 Left lower quadrant pain: Secondary | ICD-10-CM

## 2020-12-07 DIAGNOSIS — Z136 Encounter for screening for cardiovascular disorders: Secondary | ICD-10-CM

## 2020-12-07 DIAGNOSIS — R1031 Right lower quadrant pain: Secondary | ICD-10-CM

## 2020-12-07 DIAGNOSIS — F411 Generalized anxiety disorder: Secondary | ICD-10-CM

## 2020-12-07 DIAGNOSIS — E559 Vitamin D deficiency, unspecified: Secondary | ICD-10-CM | POA: Diagnosis not present

## 2020-12-07 DIAGNOSIS — Z87891 Personal history of nicotine dependence: Secondary | ICD-10-CM

## 2020-12-07 DIAGNOSIS — I10 Essential (primary) hypertension: Secondary | ICD-10-CM

## 2020-12-07 DIAGNOSIS — E785 Hyperlipidemia, unspecified: Secondary | ICD-10-CM | POA: Diagnosis not present

## 2020-12-07 DIAGNOSIS — R109 Unspecified abdominal pain: Secondary | ICD-10-CM

## 2020-12-07 MED ORDER — BUPROPION HCL ER (XL) 300 MG PO TB24: 300.0000 mg | ORAL_TABLET | ORAL | 1 refills | Status: DC

## 2020-12-07 NOTE — Progress Notes (Signed)
Subjective:    Patient ID: Julie Rush, female    DOB: 05-Jun-1951, 69 y.o.   MRN: [id number]  CC: [name] is a 69 y.o. female who presents today for follow up.   HPI: Feels well today  No new complaints   HTN- compliant with metoprolol succinate 50mg . No cp, sob, wheezing.   Depression and anxiety- feels well on wellbutrin 150mg  BID.   Stopped asa several months ago.  No further rectal bleeding, abdominal pain. Former smoker.  Colonoscopy UTD      HISTORY:  Past Medical History:  Diagnosis Date  . Anginal pain ([place])   . Arthritis    "knees, back, fingers, shoulders, neck"  . Chronic lower back pain   . Depression 06/08/12   "related to not getting definitive dx of what's wrong"  . Exertional dyspnea   . GERD (gastroesophageal reflux disease)   . H/O hiatal hernia   . Headache(784.0)   . History of stomach ulcers 1980's  . Hypertension   . Migraines    4-5x/yr  . Pneumonia   . PONV (postoperative nausea and vomiting)   . Seasonal asthma   . Sleep apnea 2013   "dx'd borderline"  . Type II diabetes mellitus ([place]) 2013   "borderline"  . Vertigo    last episode over 6 mos ago   Past Surgical History:  Procedure Laterality Date  . APPENDECTOMY  1960's  . BREAST BIOPSY     right  . CATARACT EXTRACTION W/PHACO Left 11/01/2018   Procedure: CATARACT EXTRACTION PHACO AND INTRAOCULAR LENS PLACEMENT ([place]) LEFT;  Surgeon: Eulogio Bear, MD;  Location: [place];  Service: Ophthalmology;  Laterality: Left;  . CATARACT EXTRACTION W/PHACO Right 11/29/2018   Procedure: CATARACT EXTRACTION PHACO AND INTRAOCULAR LENS PLACEMENT ([place]) RIGHT;  Surgeon: Eulogio Bear, MD;  Location: [place];  Service: Ophthalmology;  Laterality: Right;  Diabetes - diet conrolled sleep apnea  . CHOLECYSTECTOMY  2006  . COLONOSCOPY WITH PROPOFOL N/A 10/30/2020   Procedure: COLONOSCOPY WITH PROPOFOL;  Surgeon: Lesly Rubenstein, MD;  Location: [facility]  ENDOSCOPY;  Service: Endoscopy;  Laterality: N/A;  . EXPLORATORY LAPAROTOMY  late 1990's   "blocked colon S/P hernia repair"  . HERNIA REPAIR     "@ least 3 abdominal hernia repairs"  . [place]  . SHOULDER ARTHROSCOPY WITH ROTATOR CUFF REPAIR AND SUBACROMIAL DECOMPRESSION Right 05/25/2019   Procedure: Right shoulder mini open rotator cuff repair, subacromial decompression;  Surgeon: Susa Day, MD;  Location: [facility];  Service: Orthopedics;  Laterality: Right;  90 mins  . VAGINAL HYSTERECTOMY  1981   Family History  Problem Relation Age of Onset  . Arthritis Mother   . Cancer Father        prostate  . Hyperlipidemia Father   . Alcohol abuse Father   . Arthritis Father   . Heart disease Father   . Stroke Father   . Hypertension Father   . Diabetes Father   . Cancer Sister        ovary  . Arthritis Sister   . Cancer Brother        thyroid  . Arthritis Brother   . Cancer Sister        breast  . Arthritis Sister   . Breast cancer Sister 53  . Arthritis Maternal Grandmother   . Arthritis Maternal Grandfather     Allergies: Codeine, Prednisone, Sulfa antibiotics, and Trimethoprim Current Outpatient Medications on File  Prior to Visit  Medication Sig Dispense Refill  . acetaminophen (TYLENOL) 500 MG tablet Take 1,000 mg by mouth every 6 (six) hours as needed for moderate pain or headache.     . albuterol (PROVENTIL HFA) 108 (90 Base) MCG/ACT inhaler Inhale 2 puffs into the lungs every 6 (six) hours as needed for wheezing or shortness of breath. 1 Inhaler 1  . b complex vitamins tablet Take 2 tablets by mouth daily.    . methocarbamol (ROBAXIN) 500 MG tablet Take 1 tablet (500 mg total) by mouth every 8 (eight) hours as needed for muscle spasms. 20 tablet 1  . metoprolol succinate (TOPROL-XL) 50 MG 24 hr tablet Take one tablet with or immediately following a meal. 90 tablet 0  . omeprazole (PRILOSEC) 20 MG capsule Take 20 mg by mouth daily as needed (heartburn).      . triamcinolone cream (KENALOG) 0.1 % Apply 1 application topically 2 (two) times daily as needed for itching.     No current facility-administered medications on file prior to visit.    Social History   Tobacco Use  . Smoking status: Former Smoker    Packs/day: 2.00    Years: 14.00    Pack years: 28.00    Quit date: 11/08/1975    Years since quitting: 45.1  . Smokeless tobacco: Never Used  Vaping Use  . Vaping Use: Never used  Substance Use Topics  . Alcohol use: No    Alcohol/week: 0.0 standard drinks  . Drug use: No    Review of Systems  Constitutional: Negative for chills and fever.  Respiratory: Negative for cough.   Cardiovascular: Negative for chest pain and palpitations.  Gastrointestinal: Negative for nausea and vomiting.      Objective:    BP 128/84   Pulse 68   Temp 97.6 F (36.4 C)   Ht 5\' 5"  (1.651 m)   Wt 255 lb 3.2 oz (115.8 kg)   SpO2 99%   BMI 42.47 kg/m  BP Readings from Last 3 Encounters:  12/06/20 128/84  10/30/20 (!) 131/97  10/02/20 118/72   Wt Readings from Last 3 Encounters:  12/06/20 255 lb 3.2 oz (115.8 kg)  10/30/20 247 lb (112 kg)  10/02/20 242 lb (109.8 kg)    Physical Exam Vitals reviewed.  Constitutional:      Appearance: She is well-developed and well-nourished.  Eyes:     Conjunctiva/sclera: Conjunctivae normal.  Cardiovascular:     Rate and Rhythm: Normal rate and regular rhythm.     Pulses: Normal pulses.     Heart sounds: Normal heart sounds.  Pulmonary:     Effort: Pulmonary effort is normal.     Breath sounds: Normal breath sounds. No wheezing, rhonchi or rales.  Skin:    General: Skin is warm and dry.  Neurological:     Mental Status: She is alert.  Psychiatric:        Mood and Affect: Mood and affect normal.        Speech: Speech normal.        Behavior: Behavior normal.        Thought Content: Thought content normal.        Assessment & Plan:   Problem List Items Addressed This Visit       Cardiovascular and Mediastinum   Essential hypertension, benign - Primary    Controlled. Continue metoprolol succinate 50mg       Relevant Orders   CBC with Differential/Platelet   Comprehensive metabolic panel  Other   Bilateral lower abdominal pain     Adbominal pain has resolved from prior however due to smoking h/o and risk of AAA, screening for AAA is appropriate. I have ordered .       Generalized anxiety disorder    Stable on wellbutrin 300mg . Will continue.       Relevant Medications   buPROPion (WELLBUTRIN XL) 300 MG 24 hr tablet   History of rectal bleeding    Resolved. Advised not to resume ASA due to rectal bleed, age > 14, and in primary prevention.       Hyperlipidemia    Intermiadiate risk, 9.8%. Advised crestor 5mg . She declines at this time and would like to focus on diet.         Other Visit Diagnoses    Former smoker       Relevant Orders   US AORTA   Vitamin D deficiency       Relevant Orders   VITAMIN D 25 Hydroxy (Vit-D Deficiency, Fractures)   Encounter for screening for abdominal aortic aneurysm (AAA) in patient 1 years of age or older with history of smoking       Relevant Orders   US AORTA   Abdominal pain, unspecified abdominal location        Relevant Orders   US AORTA       I have discontinued Letta Median B. Buys's aspirin EC, buPROPion, ciprofloxacin, metroNIDAZOLE, and buPROPion. I have also changed her buPROPion. Additionally, I am having her maintain her acetaminophen, albuterol, omeprazole, b complex vitamins, triamcinolone, methocarbamol, and metoprolol succinate.   Meds ordered this encounter  Medications  . buPROPion (WELLBUTRIN XL) 300 MG 24 hr tablet    Sig: Take 1 tablet (300 mg total) by mouth every morning.    Dispense:  90 tablet    Refill:  1    Order Specific Question:   Supervising Provider    Answer:   Crecencio Mc [[id number]]    Return precautions given.   Risks, benefits, and alternatives of the medications  and treatment plan prescribed today were discussed, and patient expressed understanding.   Education regarding symptom management and diagnosis given to patient on AVS.  Continue to follow with Burnard Hawthorne, FNP for routine health maintenance.   Linna Hoff and I agreed with plan.   Mable Paris, FNP

## 2020-12-07 NOTE — Assessment & Plan Note (Addendum)
Intermiadiate risk, 9.8%. Advised crestor 5mg . She declines at this time and would like to focus on diet.

## 2020-12-07 NOTE — Assessment & Plan Note (Signed)
Adbominal pain has resolved from prior however due to smoking h/o and risk of AAA, screening for AAA is appropriate. I have ordered .

## 2020-12-07 NOTE — Assessment & Plan Note (Signed)
Controlled. Continue metoprolol succinate 50mg 

## 2020-12-07 NOTE — Patient Instructions (Signed)
Order for ultrasound of abdomen  Let us know if you dont hear back within a week in regards to an appointment being scheduled.   Nice see to you!

## 2020-12-07 NOTE — Addendum Note (Signed)
Addended by: Cheri Rous E on: 12/07/2020 04:13 PM   Modules accepted: Orders

## 2020-12-07 NOTE — Assessment & Plan Note (Signed)
Resolved. Advised not to resume ASA due to rectal bleed, age > 57, and in primary prevention.

## 2020-12-07 NOTE — Telephone Encounter (Signed)
Noted she is NOT on XR.   She is on 12 hr

## 2020-12-07 NOTE — Telephone Encounter (Signed)
Call pt I noticed right after she left that is taking Wellbutrin 150mg  XL BID. It is extended release, she needs to take at same time, in the morning  Since she had been on 150mg  BID, I have sent in NEW 300mg  XL wellbutrin which she takes in the morning. She will only take ONE tablet since 300mg . Please ensure she understands all

## 2020-12-07 NOTE — Telephone Encounter (Signed)
So sorry she is taking the Bupropion HCL 150 12hr. I added the wrong one back to her chart.

## 2020-12-07 NOTE — Assessment & Plan Note (Signed)
Stable on wellbutrin 300mg . Will continue.

## 2020-12-08 LAB — CBC WITH DIFFERENTIAL/PLATELET
Absolute Monocytes: 469 cells/uL (ref 200–950)
Basophils Absolute: 57 cells/uL (ref 0–200)
Basophils Relative: 0.8 %
Eosinophils Absolute: 241 cells/uL (ref 15–500)
Eosinophils Relative: 3.4 %
HCT: 34.6 % — ABNORMAL LOW (ref 35.0–45.0)
Hemoglobin: 11.6 g/dL — ABNORMAL LOW (ref 11.7–15.5)
Lymphs Abs: 2322 cells/uL (ref 850–3900)
MCH: 31.4 pg (ref 27.0–33.0)
MCHC: 33.5 g/dL (ref 32.0–36.0)
MCV: 93.8 fL (ref 80.0–100.0)
MPV: 10.7 fL (ref 7.5–12.5)
Monocytes Relative: 6.6 %
Neutro Abs: 4012 cells/uL (ref 1500–7800)
Neutrophils Relative %: 56.5 %
Platelets: 277 10*3/uL (ref 140–400)
RBC: 3.69 10*6/uL — ABNORMAL LOW (ref 3.80–5.10)
RDW: 11.8 % (ref 11.0–15.0)
Total Lymphocyte: 32.7 %
WBC: 7.1 10*3/uL (ref 3.8–10.8)

## 2020-12-08 LAB — COMPREHENSIVE METABOLIC PANEL
AG Ratio: 1.4 (calc) (ref 1.0–2.5)
ALT: 14 U/L (ref 6–29)
AST: 17 U/L (ref 10–35)
Albumin: 3.8 g/dL (ref 3.6–5.1)
Alkaline phosphatase (APISO): 58 U/L (ref 37–153)
BUN/Creatinine Ratio: 19 (calc) (ref 6–22)
BUN: 21 mg/dL (ref 7–25)
CO2: 22 mmol/L (ref 20–32)
Calcium: 9.3 mg/dL (ref 8.6–10.4)
Chloride: 105 mmol/L (ref 98–110)
Creat: 1.08 mg/dL — ABNORMAL HIGH (ref 0.60–0.93)
Globulin: 2.7 g/dL (calc) (ref 1.9–3.7)
Glucose, Bld: 94 mg/dL (ref 65–99)
Potassium: 4.5 mmol/L (ref 3.5–5.3)
Sodium: 137 mmol/L (ref 135–146)
Total Bilirubin: 0.4 mg/dL (ref 0.2–1.2)
Total Protein: 6.5 g/dL (ref 6.1–8.1)

## 2020-12-08 LAB — VITAMIN D 25 HYDROXY (VIT D DEFICIENCY, FRACTURES): Vit D, 25-Hydroxy: 26 ng/mL — ABNORMAL LOW (ref 30–100)

## 2020-12-10 ENCOUNTER — Telehealth: Payer: Self-pay | Admitting: Family

## 2020-12-10 NOTE — Telephone Encounter (Signed)
close

## 2020-12-17 ENCOUNTER — Other Ambulatory Visit: Payer: Self-pay | Admitting: Family

## 2020-12-17 DIAGNOSIS — D649 Anemia, unspecified: Secondary | ICD-10-CM

## 2020-12-17 DIAGNOSIS — R6889 Other general symptoms and signs: Secondary | ICD-10-CM

## 2020-12-18 ENCOUNTER — Telehealth: Payer: Self-pay | Admitting: Family

## 2020-12-18 NOTE — Telephone Encounter (Signed)
lft vm for pt to call to sch [place] at [phone] option [phone] and then 2

## 2020-12-20 ENCOUNTER — Other Ambulatory Visit: Payer: Self-pay

## 2020-12-20 ENCOUNTER — Other Ambulatory Visit (INDEPENDENT_AMBULATORY_CARE_PROVIDER_SITE_OTHER): Payer: Medicare Other

## 2020-12-20 DIAGNOSIS — D649 Anemia, unspecified: Secondary | ICD-10-CM

## 2020-12-20 DIAGNOSIS — R6889 Other general symptoms and signs: Secondary | ICD-10-CM

## 2020-12-20 LAB — B12 AND FOLATE PANEL
Folate: >23.6 ng/mL (ref >5.9)
Vitamin B-12: 353 pg/mL (ref 211–911)

## 2020-12-20 LAB — URINALYSIS, ROUTINE W REFLEX MICROSCOPIC
Bilirubin Urine: NEGATIVE
Hgb urine dipstick: NEGATIVE
Ketones, ur: NEGATIVE
Nitrite: NEGATIVE
Specific Gravity, Urine: 1.015 (ref 1.000–1.030)
Total Protein, Urine: NEGATIVE
Urine Glucose: NEGATIVE
Urobilinogen, UA: 0.2 (ref 0.0–1.0)
pH: 5.5 (ref 5.0–8.0)

## 2020-12-20 LAB — IBC + FERRITIN
Ferritin: 28.5 ng/mL (ref 10.0–291.0)
Iron: 79 ug/dL (ref 42–145)
Saturation Ratios: 24.2 % (ref 20.0–50.0)
Transferrin: 233 mg/dL (ref 212.0–360.0)

## 2020-12-24 ENCOUNTER — Encounter: Payer: Self-pay | Admitting: Family

## 2020-12-24 LAB — PROTEIN ELECTROPHORESIS, SERUM
Albumin ELP: 3.9 g/dL (ref 3.8–4.8)
Alpha 1: 0.3 g/dL (ref 0.2–0.3)
Alpha 2: 0.7 g/dL (ref 0.5–0.9)
Beta 2: 0.5 g/dL (ref 0.2–0.5)
Beta Globulin: 0.5 g/dL (ref 0.4–0.6)
Gamma Globulin: 1 g/dL (ref 0.8–1.7)
Total Protein: 6.9 g/dL (ref 6.1–8.1)

## 2020-12-25 ENCOUNTER — Ambulatory Visit
Admission: RE | Admit: 2020-12-25 | Discharge: 2020-12-25 | Disposition: A | Discharge status: Home / Self Care | Payer: Medicare Other | Source: Ambulatory Visit | Attending: Family | Admitting: Family

## 2020-12-25 ENCOUNTER — Other Ambulatory Visit: Payer: Self-pay

## 2020-12-25 DIAGNOSIS — R109 Unspecified abdominal pain: Secondary | ICD-10-CM | POA: Insufficient documentation

## 2020-12-25 DIAGNOSIS — Z87891 Personal history of nicotine dependence: Secondary | ICD-10-CM | POA: Diagnosis not present

## 2020-12-25 DIAGNOSIS — Z136 Encounter for screening for cardiovascular disorders: Secondary | ICD-10-CM | POA: Diagnosis not present

## 2020-12-25 LAB — MULTIPLE MYELOMA PANEL, SERUM
Albumin SerPl Elph-Mcnc: 5 g/dL — ABNORMAL HIGH (ref 2.9–4.4)
Albumin/Glob SerPl: 1.3 (ref 0.7–1.7)
Alpha 1: 0.3 g/dL (ref 0.0–0.4)
Alpha2 Glob SerPl Elph-Mcnc: 0.9 g/dL (ref 0.4–1.0)
B-Globulin SerPl Elph-Mcnc: 1.4 g/dL — ABNORMAL HIGH (ref 0.7–1.3)
Gamma Glob SerPl Elph-Mcnc: 1.2 g/dL (ref 0.4–1.8)
Globulin, Total: 3.9 g/dL (ref 2.2–3.9)
IgA/Immunoglobulin A, Serum: 348 mg/dL (ref 64–422)
IgG (Immunoglobin G), Serum: 955 mg/dL (ref 586–1602)
IgM (Immunoglobulin M), Srm: 51 mg/dL (ref 26–217)

## 2020-12-25 LAB — IFE AND PE, RANDOM URINE: Protein, Ur: 6.4 mg/dL

## 2020-12-25 LAB — PE AND FLC, SERUM
Ig Kappa Free Light Chain: 29 mg/L — ABNORMAL HIGH (ref 3.3–19.4)
Ig Lambda Free Light Chain: 19.7 mg/L (ref 5.7–26.3)
KAPPA/LAMBDA RATIO: 1.47 (ref 0.26–1.65)
Total Protein: 8.9 g/dL — ABNORMAL HIGH (ref 6.0–8.5)

## 2020-12-25 MED ORDER — METHOCARBAMOL 500 MG PO TABS: 500.0000 mg | ORAL_TABLET | Freq: Three times a day (TID) | ORAL | 1 refills | Status: DC | PRN

## 2020-12-28 ENCOUNTER — Ambulatory Visit: Payer: Medicare Other | Admitting: Family

## 2021-01-11 ENCOUNTER — Encounter: Payer: Self-pay | Admitting: Family

## 2021-01-11 ENCOUNTER — Other Ambulatory Visit: Payer: Self-pay

## 2021-01-11 DIAGNOSIS — J209 Acute bronchitis, unspecified: Secondary | ICD-10-CM

## 2021-01-11 MED ORDER — ALBUTEROL SULFATE HFA 108 (90 BASE) MCG/ACT IN AERS: 2.0000 | INHALATION_SPRAY | Freq: Four times a day (QID) | RESPIRATORY_TRACT | 1 refills | Status: DC | PRN

## 2021-01-16 ENCOUNTER — Other Ambulatory Visit: Payer: Self-pay | Admitting: Family

## 2021-01-16 DIAGNOSIS — D649 Anemia, unspecified: Secondary | ICD-10-CM

## 2021-01-17 ENCOUNTER — Telehealth: Payer: Self-pay

## 2021-01-17 ENCOUNTER — Other Ambulatory Visit (INDEPENDENT_AMBULATORY_CARE_PROVIDER_SITE_OTHER): Payer: Medicare Other

## 2021-01-17 ENCOUNTER — Other Ambulatory Visit: Payer: Self-pay

## 2021-01-17 DIAGNOSIS — R300 Dysuria: Secondary | ICD-10-CM

## 2021-01-17 DIAGNOSIS — D649 Anemia, unspecified: Secondary | ICD-10-CM | POA: Diagnosis not present

## 2021-01-17 NOTE — Telephone Encounter (Signed)
LMTCB for results. 

## 2021-01-17 NOTE — Progress Notes (Signed)
0

## 2021-01-17 NOTE — Progress Notes (Signed)
Verbal consent for services obtained from patient prior to services given to TELEPHONE visit:   Location of call:  provider at work patient at home  Names of all persons present for services: Mable Paris, NP and patient Chief complaint:   Acute visit complains dysuria x 2 weeks, unchanged.  No fever, nausea, vomiting, constiptation, abdominal pain. When wiping has seen scant BRB from vagina as 'tissue feels raw' from burning sensation.    No allergies to amoxicillin.  Chronic low back pain which is not new for her, this not worse.  She doesn't have chronic UTIs.  UPEP lab collected 01/17/21     A/P/next steps:  Problem List Items Addressed This Visit      Other   Dysuria    Afebrile. Small WBCs seen in UA. Patient felt most comfortable waiting on urine culture and declined empiric treatment today. Pending culture.          I spent 10 min  discussing plan of care over the phone.

## 2021-01-18 ENCOUNTER — Encounter: Payer: Self-pay | Admitting: Family

## 2021-01-18 ENCOUNTER — Telehealth (INDEPENDENT_AMBULATORY_CARE_PROVIDER_SITE_OTHER): Payer: Medicare Other | Admitting: Family

## 2021-01-18 DIAGNOSIS — R300 Dysuria: Secondary | ICD-10-CM

## 2021-01-18 LAB — URINALYSIS, ROUTINE W REFLEX MICROSCOPIC
Bilirubin Urine: NEGATIVE
Hgb urine dipstick: NEGATIVE
Ketones, ur: NEGATIVE
Nitrite: NEGATIVE
RBC / HPF: NONE SEEN
Specific Gravity, Urine: 1.015 (ref 1.000–1.030)
Total Protein, Urine: NEGATIVE
Urine Glucose: NEGATIVE
Urobilinogen, UA: 0.2 (ref 0.0–1.0)
pH: 5.5 (ref 5.0–8.0)

## 2021-01-18 LAB — MICROALBUMIN / CREATININE URINE RATIO
Creatinine,U: 92.7 mg/dL
Microalb Creat Ratio: 1.2 mg/g (ref 0.0–30.0)
Microalb, Ur: 1.1 mg/dL (ref 0.0–1.9)

## 2021-01-18 NOTE — Assessment & Plan Note (Signed)
Afebrile. Small WBCs seen in UA. Patient felt most comfortable waiting on urine culture and declined empiric treatment today. Pending culture.

## 2021-01-19 LAB — URINE CULTURE: Organism ID, Bacteria: NO GROWTH

## 2021-01-22 LAB — IFE AND PE, RANDOM URINE
% BETA, Urine: 23 %
ALBUMIN, U: 56.7 %
ALPHA 1 URINE: 3.6 %
ALPHA-2-GLOBULIN, U: 8.4 %
GAMMA GLOBULIN URINE: 8.3 %
Protein, Ur: 6.2 mg/dL

## 2021-01-23 DIAGNOSIS — M170 Bilateral primary osteoarthritis of knee: Secondary | ICD-10-CM | POA: Diagnosis not present

## 2021-02-05 ENCOUNTER — Encounter: Payer: Self-pay | Admitting: Family

## 2021-02-05 DIAGNOSIS — I10 Essential (primary) hypertension: Secondary | ICD-10-CM

## 2021-02-06 MED ORDER — METOPROLOL SUCCINATE ER 50 MG PO TB24: ORAL_TABLET | ORAL | 3 refills | Status: DC

## 2021-02-07 DIAGNOSIS — M170 Bilateral primary osteoarthritis of knee: Secondary | ICD-10-CM | POA: Diagnosis not present

## 2021-02-07 DIAGNOSIS — M1712 Unilateral primary osteoarthritis, left knee: Secondary | ICD-10-CM | POA: Diagnosis not present

## 2021-02-07 DIAGNOSIS — M1711 Unilateral primary osteoarthritis, right knee: Secondary | ICD-10-CM | POA: Diagnosis not present

## 2021-02-14 DIAGNOSIS — M170 Bilateral primary osteoarthritis of knee: Secondary | ICD-10-CM | POA: Diagnosis not present

## 2021-02-26 ENCOUNTER — Encounter: Payer: Self-pay | Admitting: Family

## 2021-02-26 NOTE — Telephone Encounter (Signed)
I called patient & let her know that letter was placed in chart. I have also emailed to her as well.

## 2021-02-28 ENCOUNTER — Other Ambulatory Visit: Payer: Self-pay | Admitting: Family

## 2021-02-28 DIAGNOSIS — J209 Acute bronchitis, unspecified: Secondary | ICD-10-CM

## 2021-04-30 ENCOUNTER — Other Ambulatory Visit: Payer: Self-pay | Admitting: Family

## 2021-04-30 DIAGNOSIS — J209 Acute bronchitis, unspecified: Secondary | ICD-10-CM

## 2021-05-01 ENCOUNTER — Ambulatory Visit
Admission: RE | Admit: 2021-05-01 | Discharge: 2021-05-01 | Disposition: A | Discharge status: Home / Self Care | Payer: Medicare Other | Source: Ambulatory Visit | Attending: Family | Admitting: Family

## 2021-05-01 ENCOUNTER — Other Ambulatory Visit: Payer: Self-pay | Admitting: Family

## 2021-05-01 ENCOUNTER — Other Ambulatory Visit: Payer: Self-pay

## 2021-05-01 DIAGNOSIS — Z1231 Encounter for screening mammogram for malignant neoplasm of breast: Secondary | ICD-10-CM

## 2021-05-29 ENCOUNTER — Ambulatory Visit
Admission: RE | Admit: 2021-05-29 | Discharge: 2021-05-29 | Disposition: A | Discharge status: Home / Self Care | Payer: Medicare Other | Source: Ambulatory Visit

## 2021-05-29 ENCOUNTER — Other Ambulatory Visit: Payer: Self-pay

## 2021-05-29 VITALS — BP 115/69 | HR 71 | Temp 98.4°F | Resp 18 | Ht 65.0 in | Wt 260.0 lb

## 2021-05-29 DIAGNOSIS — J0100 Acute maxillary sinusitis, unspecified: Secondary | ICD-10-CM | POA: Diagnosis not present

## 2021-05-29 MED ORDER — IPRATROPIUM BROMIDE 0.06 % NA SOLN: 2.0000 | Freq: Four times a day (QID) | NASAL | 12 refills | Status: DC

## 2021-05-29 MED ORDER — BENZONATATE 100 MG PO CAPS: 200.0000 mg | ORAL_CAPSULE | Freq: Three times a day (TID) | ORAL | 0 refills | Status: DC

## 2021-05-29 MED ORDER — AMOXICILLIN-POT CLAVULANATE 875-125 MG PO TABS: 1.0000 | ORAL_TABLET | Freq: Two times a day (BID) | ORAL | 0 refills | Status: AC

## 2021-05-29 MED ORDER — PROMETHAZINE-DM 6.25-15 MG/5ML PO SYRP: 5.0000 mL | ORAL_SOLUTION | Freq: Four times a day (QID) | ORAL | 0 refills | Status: DC | PRN

## 2021-05-29 NOTE — ED Triage Notes (Addendum)
Pt c/o productive cough, sore throat, right ear pain, nasal congestion since this past Friday. Pt has also had some nausea, denies f/c/v/d or other symptoms. No one else at home is sick. Pt has taken 2 at-home COVID tests, Saturday and yesterday, both were negative.

## 2021-05-29 NOTE — Discharge Instructions (Addendum)
The Augmentin twice daily with food for 10 days for treatment of your sinusitis.  Perform sinus irrigation 2-3 times a day with a NeilMed sinus rinse kit and distilled water.  Do not use tap water.  You can use plain over-the-counter Mucinex every 6 hours to break up the stickiness of the mucus so your body can clear it.  Increase your oral fluid intake to thin out your mucus so that is also able for your body to clear more easily.  Take an over-the-counter probiotic, such as Culturelle-align-activia, 1 hour after each dose of antibiotic to prevent diarrhea.  Take the Tessalon perles during the day as needed fro cough and the promethazine DM at bedtime as it will make you drowsy.  Use the Atrovent nasal spray, 2 squirts in each nostril every 6 hours as needed.   If you develop any new or worsening symptoms return for reevaluation or see your primary care provider.

## 2021-05-29 NOTE — ED Provider Notes (Signed)
[facility]    CSN: [id number] Arrival date & time: 05/29/21  5284      History   Chief Complaint Chief Complaint  Patient presents with  . Cough  . Sore Throat  . Nasal Congestion    HPI Julie Rush is a 70 y.o. female.   HPI   70 year old female here for evaluation of nasal congestion, right ear pain, sore throat, and productive cough.  Patient reports that she has been having symptoms for the last 5 days and she is also having associated symptoms of green nasal discharge, productive cough green sputum, body aches, pain in her upper teeth, and hoarseness.  She denies fever, ringing in her ears, shortness breath or wheezing, or GI complaints.  Patient reports that she does have allergies but does not take any medication for her allergies.  Past Medical History:  Diagnosis Date  . Anginal pain ([place])   . Arthritis    "knees, back, fingers, shoulders, neck"  . Chronic lower back pain   . Depression 06/08/12   "related to not getting definitive dx of what's wrong"  . Exertional dyspnea   . GERD (gastroesophageal reflux disease)   . H/O hiatal hernia   . Headache(784.0)   . History of stomach ulcers 1980's  . Hypertension   . Migraines    4-5x/yr  . Pneumonia   . PONV (postoperative nausea and vomiting)   . Seasonal asthma   . Sleep apnea 2013   "dx'd borderline"  . Type II diabetes mellitus ([place]) 2013   "borderline"  . Vertigo    last episode over 6 mos ago    Patient Active Problem List   Diagnosis Date Noted  . Dysuria 01/18/2021  . Bilateral lower abdominal pain 10/02/2020  . Constipation 10/02/2020  . History of rectal bleeding 10/02/2020  . Complete rotator cuff tear 05/25/2019  . Pre-operative clearance 02/11/2019  . HLD (hyperlipidemia) 02/11/2019  . Osteoarthritis of right knee 10/22/2018  . Breast mass, right 11/03/2017  . Routine physical examination 01/12/2017  . Chest pain 01/12/2017  . Muscle cramp 11/20/2016  . Acute  bronchitis 11/20/2016  . Arthritis 04/08/2015  . Generalized anxiety disorder 04/08/2015  . GERD (gastroesophageal reflux disease) 04/08/2015  . Other dyspnea and respiratory abnormality 06/08/2012  . Essential hypertension, benign 06/08/2012  . Hyperlipidemia 06/08/2012  . Severe obesity (BMI >= 40) ([place]) 06/08/2012  . Morbid obesity ([place]) 06/08/2012    Past Surgical History:  Procedure Laterality Date  . APPENDECTOMY  1960's  . BREAST BIOPSY Right    right  . CATARACT EXTRACTION W/PHACO Left 11/01/2018   Procedure: CATARACT EXTRACTION PHACO AND INTRAOCULAR LENS PLACEMENT ([place]) LEFT;  Surgeon: Eulogio Bear, MD;  Location: [facility];  Service: Ophthalmology;  Laterality: Left;  . CATARACT EXTRACTION W/PHACO Right 11/29/2018   Procedure: CATARACT EXTRACTION PHACO AND INTRAOCULAR LENS PLACEMENT ([place]) RIGHT;  Surgeon: Eulogio Bear, MD;  Location: [facility];  Service: Ophthalmology;  Laterality: Right;  Diabetes - diet conrolled sleep apnea  . CHOLECYSTECTOMY  2006  . COLONOSCOPY WITH PROPOFOL N/A 10/30/2020   Procedure: COLONOSCOPY WITH PROPOFOL;  Surgeon: Lesly Rubenstein, MD;  Location: [facility];  Service: Endoscopy;  Laterality: N/A;  . EXPLORATORY LAPAROTOMY  late 1990's   "blocked colon S/P hernia repair"  . HERNIA REPAIR     "@ least 3 abdominal hernia repairs"  . [place]  . SHOULDER ARTHROSCOPY WITH ROTATOR CUFF REPAIR AND SUBACROMIAL DECOMPRESSION Right  05/25/2019   Procedure: Right shoulder mini open rotator cuff repair, subacromial decompression;  Surgeon: Susa Day, MD;  Location: [facility];  Service: Orthopedics;  Laterality: Right;  90 mins  . VAGINAL HYSTERECTOMY  1981    OB History   No obstetric history on file.      Home Medications    Prior to Admission medications   Medication Sig Start Date End Date Taking? Authorizing Provider  acetaminophen (TYLENOL) 500 MG tablet Take 1,000 mg by mouth every 6  (six) hours as needed for moderate pain or headache.    Yes [provider]  albuterol (VENTOLIN HFA) 108 (90 Base) MCG/ACT inhaler INHALE 2 PUFFS BY MOUTH EVERY 6 HOURS AS NEEDED FOR WHEEZE OR SHORTNESS OF BREATH 04/30/21  Yes Arnett, Yvetta Coder, FNP  amoxicillin-clavulanate (AUGMENTIN) 875-125 MG tablet Take 1 tablet by mouth every 12 (twelve) hours for 10 days. 05/29/21 06/08/21 Yes Margarette Canada, NP  b complex vitamins tablet Take 2 tablets by mouth daily.   Yes [provider]  benzonatate (TESSALON) 100 MG capsule Take 2 capsules (200 mg total) by mouth every 8 (eight) hours. 05/29/21  Yes Margarette Canada, NP  buPROPion (WELLBUTRIN XL) 300 MG 24 hr tablet Take 1 tablet by mouth every morning. 04/10/21  Yes [provider]  ipratropium (ATROVENT) 0.06 % nasal spray Place 2 sprays into both nostrils 4 (four) times daily. 05/29/21  Yes Margarette Canada, NP  methocarbamol (ROBAXIN) 500 MG tablet Take 1 tablet (500 mg total) by mouth every 8 (eight) hours as needed for muscle spasms. 12/25/20  Yes Burnard Hawthorne, FNP  metoprolol succinate (TOPROL-XL) 50 MG 24 hr tablet Take one tablet with or immediately following a meal. 02/06/21  Yes Arnett, Yvetta Coder, FNP  omeprazole (PRILOSEC) 20 MG capsule Take 20 mg by mouth daily as needed (heartburn).    Yes [provider]  promethazine-dextromethorphan (PROMETHAZINE-DM) 6.25-15 MG/5ML syrup Take 5 mLs by mouth 4 (four) times daily as needed. 05/29/21  Yes Margarette Canada, NP  triamcinolone cream (KENALOG) 0.1 % Apply 1 application topically 2 (two) times daily as needed for itching. 02/14/19  Yes [provider]    Family History Family History  Problem Relation Age of Onset  . Arthritis Mother   . Cancer Father        prostate  . Hyperlipidemia Father   . Alcohol abuse Father   . Arthritis Father   . Heart disease Father   . Stroke Father   . Hypertension Father   . Diabetes Father   . Cancer Sister        ovary  .  Arthritis Sister   . Cancer Brother        thyroid  . Arthritis Brother   . Cancer Sister        breast  . Arthritis Sister   . Breast cancer Sister 12  . Arthritis Maternal Grandmother   . Arthritis Maternal Grandfather     Social History Social History   Tobacco Use  . Smoking status: Former Smoker    Packs/day: 2.00    Years: 14.00    Pack years: 28.00    Quit date: 11/08/1975    Years since quitting: 45.5  . Smokeless tobacco: Never Used  Vaping Use  . Vaping Use: Never used  Substance Use Topics  . Alcohol use: No    Alcohol/week: 0.0 standard drinks  . Drug use: No     Allergies   Codeine, Prednisone, Sulfa antibiotics,  and Trimethoprim   Review of Systems Review of Systems  Constitutional: Negative for activity change, appetite change and fever.  HENT: Positive for congestion, rhinorrhea, sinus pain, sore throat and voice change.   Respiratory: Positive for cough. Negative for shortness of breath and wheezing.   Gastrointestinal: Negative for diarrhea and vomiting.  Skin: Negative for rash.  Neurological: Negative for headaches.  Hematological: Negative.   Psychiatric/Behavioral: Negative.      Physical Exam Triage Vital Signs ED Triage Vitals [05/29/21 1013]  Enc Vitals Group     BP      Pulse      Resp      Temp      Temp src      SpO2      Weight 260 lb (117.9 kg)     Height '5\' 5"'  (1.651 m)     Head Circumference      Peak Flow      Pain Score 9     Pain Loc      Pain Edu?      Excl. in [place]?    No data found.  Updated Vital Signs BP 115/69 (BP Location: Left Arm)   Pulse 71   Temp 98.4 F (36.9 C) (Oral)   Resp 18   Ht '5\' 5"'  (1.651 m)   Wt 260 lb (117.9 kg)   SpO2 97%   BMI 43.27 kg/m   Visual Acuity Right Eye Distance:   Left Eye Distance:   Bilateral Distance:    Right Eye Near:   Left Eye Near:    Bilateral Near:     Physical Exam Vitals and nursing note reviewed.  Constitutional:      General: She is not in  acute distress.    Appearance: She is well-developed. She is obese. She is not ill-appearing.  HENT:     Head: Normocephalic and atraumatic.     Right Ear: Tympanic membrane and ear canal normal. No middle ear effusion. Tympanic membrane is not erythematous.     Left Ear: Tympanic membrane and ear canal normal.  No middle ear effusion. Tympanic membrane is not erythematous.     Nose: Congestion and rhinorrhea present.     Mouth/Throat:     Mouth: Mucous membranes are moist.     Pharynx: Oropharynx is clear. Uvula midline. Posterior oropharyngeal erythema present.     Tonsils: No tonsillar exudate. 0 on the right. 0 on the left.  Cardiovascular:     Rate and Rhythm: Normal rate and regular rhythm.     Heart sounds: Normal heart sounds. No murmur heard. No gallop.   Pulmonary:     Effort: Pulmonary effort is normal.     Breath sounds: Normal breath sounds. No wheezing, rhonchi or rales.  Musculoskeletal:     Cervical back: Normal range of motion and neck supple.  Lymphadenopathy:     Cervical: No cervical adenopathy.  Skin:    General: Skin is warm and dry.     Capillary Refill: Capillary refill takes less than 2 seconds.     Findings: No erythema.  Neurological:     General: No focal deficit present.     Mental Status: She is alert and oriented to person, place, and time.  Psychiatric:        Mood and Affect: Mood normal.        Behavior: Behavior normal.      UC Treatments / Results  Labs (all labs ordered are listed, but only abnormal  results are displayed) Labs Reviewed - No data to display  EKG   Radiology No results found.  Procedures Procedures (including critical care time)  Medications Ordered in UC Medications - No data to display  Initial Impression / Assessment and Plan / UC Course  I have reviewed the triage vital signs and the nursing notes.  Pertinent labs & imaging results that were available during my care of the patient were reviewed by me and  considered in my medical decision making (see chart for details).   Patient is a pleasant 70 year old female who is ill-appearing who presents for evaluation of respiratory complaints as outlined in HPI above.  Patient's physical exam reveals bilateral pearly gray tympanic membranes with a normal light reflex and clear external auditory canals.  Nasal mucosa is pale and edematous with purulent nasal discharge in the right nare.  Patient does have tenderness to percussion of her maxillary sinuses bilaterally.  Oropharyngeal exam reveals cobblestoning in the posterior oropharynx with a yellow-green postnasal drip.  No cervical lymphadenopathy appreciated exam.  Cardiopulmonary exam is benign.  Patient's exam is consistent with maxillary sinusitis possibly secondary to allergic congestion.  We will treat patient with Augmentin, ipratropium nasal spray, Tessalon Perles and Promethazine DM cough syrup to help with cough and congestion.   Final Clinical Impressions(s) / UC Diagnoses   Final diagnoses:  Acute non-recurrent maxillary sinusitis     Discharge Instructions     The Augmentin twice daily with food for 10 days for treatment of your sinusitis.  Perform sinus irrigation 2-3 times a day with a NeilMed sinus rinse kit and distilled water.  Do not use tap water.  You can use plain over-the-counter Mucinex every 6 hours to break up the stickiness of the mucus so your body can clear it.  Increase your oral fluid intake to thin out your mucus so that is also able for your body to clear more easily.  Take an over-the-counter probiotic, such as Culturelle-align-activia, 1 hour after each dose of antibiotic to prevent diarrhea.  Take the Tessalon perles during the day as needed fro cough and the promethazine DM at bedtime as it will make you drowsy.  Use the Atrovent nasal spray, 2 squirts in each nostril every 6 hours as needed.   If you develop any new or worsening symptoms return for  reevaluation or see your primary care provider.     ED Prescriptions    Medication Sig Dispense Auth. Provider   amoxicillin-clavulanate (AUGMENTIN) 875-125 MG tablet Take 1 tablet by mouth every 12 (twelve) hours for 10 days. 20 tablet Margarette Canada, NP   benzonatate (TESSALON) 100 MG capsule Take 2 capsules (200 mg total) by mouth every 8 (eight) hours. 21 capsule Margarette Canada, NP   ipratropium (ATROVENT) 0.06 % nasal spray Place 2 sprays into both nostrils 4 (four) times daily. 15 mL Margarette Canada, NP   promethazine-dextromethorphan (PROMETHAZINE-DM) 6.25-15 MG/5ML syrup Take 5 mLs by mouth 4 (four) times daily as needed. 118 mL Margarette Canada, NP     PDMP not reviewed this encounter.   Margarette Canada, NP 05/29/21 1041

## 2021-06-30 DIAGNOSIS — Z20822 Contact with and (suspected) exposure to covid-19: Secondary | ICD-10-CM | POA: Diagnosis not present

## 2021-07-08 ENCOUNTER — Other Ambulatory Visit: Payer: Self-pay | Admitting: Family

## 2021-07-08 DIAGNOSIS — F411 Generalized anxiety disorder: Secondary | ICD-10-CM

## 2021-09-05 ENCOUNTER — Ambulatory Visit: Payer: Medicare Other

## 2021-09-05 ENCOUNTER — Telehealth: Payer: Self-pay

## 2021-09-05 NOTE — Telephone Encounter (Signed)
Unable to reach patient for scheduled AWV. No answer. Unable to leave message. Reschedule as appropriate.

## 2021-11-13 DIAGNOSIS — Z20828 Contact with and (suspected) exposure to other viral communicable diseases: Secondary | ICD-10-CM | POA: Diagnosis not present

## 2021-12-10 DIAGNOSIS — M170 Bilateral primary osteoarthritis of knee: Secondary | ICD-10-CM | POA: Diagnosis not present

## 2021-12-28 ENCOUNTER — Other Ambulatory Visit: Payer: Self-pay | Admitting: Family

## 2021-12-28 DIAGNOSIS — F411 Generalized anxiety disorder: Secondary | ICD-10-CM

## 2022-01-02 DIAGNOSIS — M170 Bilateral primary osteoarthritis of knee: Secondary | ICD-10-CM | POA: Diagnosis not present

## 2022-01-02 DIAGNOSIS — M1711 Unilateral primary osteoarthritis, right knee: Secondary | ICD-10-CM | POA: Diagnosis not present

## 2022-01-02 DIAGNOSIS — M1712 Unilateral primary osteoarthritis, left knee: Secondary | ICD-10-CM | POA: Diagnosis not present

## 2022-01-09 DIAGNOSIS — M1711 Unilateral primary osteoarthritis, right knee: Secondary | ICD-10-CM | POA: Diagnosis not present

## 2022-01-09 DIAGNOSIS — M1712 Unilateral primary osteoarthritis, left knee: Secondary | ICD-10-CM | POA: Diagnosis not present

## 2022-01-09 DIAGNOSIS — M170 Bilateral primary osteoarthritis of knee: Secondary | ICD-10-CM | POA: Diagnosis not present

## 2022-01-16 DIAGNOSIS — M170 Bilateral primary osteoarthritis of knee: Secondary | ICD-10-CM | POA: Diagnosis not present

## 2022-01-16 DIAGNOSIS — M1711 Unilateral primary osteoarthritis, right knee: Secondary | ICD-10-CM | POA: Diagnosis not present

## 2022-01-16 DIAGNOSIS — M1712 Unilateral primary osteoarthritis, left knee: Secondary | ICD-10-CM | POA: Diagnosis not present

## 2022-01-31 DIAGNOSIS — Z20822 Contact with and (suspected) exposure to covid-19: Secondary | ICD-10-CM | POA: Diagnosis not present

## 2022-02-22 ENCOUNTER — Other Ambulatory Visit: Payer: Self-pay | Admitting: Family

## 2022-02-22 DIAGNOSIS — J209 Acute bronchitis, unspecified: Secondary | ICD-10-CM

## 2022-03-03 ENCOUNTER — Other Ambulatory Visit: Payer: Self-pay | Admitting: Family

## 2022-03-03 ENCOUNTER — Ambulatory Visit (INDEPENDENT_AMBULATORY_CARE_PROVIDER_SITE_OTHER): Payer: Medicare Other | Admitting: Family

## 2022-03-03 ENCOUNTER — Encounter: Payer: Self-pay | Admitting: Family

## 2022-03-03 ENCOUNTER — Other Ambulatory Visit: Payer: Self-pay

## 2022-03-03 VITALS — BP 130/78 | HR 77 | Temp 98.7°F | Ht 65.0 in | Wt 272.4 lb

## 2022-03-03 DIAGNOSIS — Z780 Asymptomatic menopausal state: Secondary | ICD-10-CM | POA: Diagnosis not present

## 2022-03-03 DIAGNOSIS — E1121 Type 2 diabetes mellitus with diabetic nephropathy: Secondary | ICD-10-CM

## 2022-03-03 DIAGNOSIS — D649 Anemia, unspecified: Secondary | ICD-10-CM | POA: Diagnosis not present

## 2022-03-03 DIAGNOSIS — Z0000 Encounter for general adult medical examination without abnormal findings: Secondary | ICD-10-CM | POA: Diagnosis not present

## 2022-03-03 DIAGNOSIS — E559 Vitamin D deficiency, unspecified: Secondary | ICD-10-CM | POA: Diagnosis not present

## 2022-03-03 DIAGNOSIS — E785 Hyperlipidemia, unspecified: Secondary | ICD-10-CM

## 2022-03-03 DIAGNOSIS — Z1231 Encounter for screening mammogram for malignant neoplasm of breast: Secondary | ICD-10-CM

## 2022-03-03 DIAGNOSIS — I10 Essential (primary) hypertension: Secondary | ICD-10-CM | POA: Diagnosis not present

## 2022-03-03 LAB — COMPREHENSIVE METABOLIC PANEL
ALT: 14 U/L (ref 0–35)
AST: 18 U/L (ref 0–37)
Albumin: 4.1 g/dL (ref 3.5–5.2)
Alkaline Phosphatase: 53 U/L (ref 39–117)
BUN: 27 mg/dL — ABNORMAL HIGH (ref 6–23)
CO2: 26 mEq/L (ref 19–32)
Calcium: 9.9 mg/dL (ref 8.4–10.5)
Chloride: 101 mEq/L (ref 96–112)
Creatinine, Ser: 1.31 mg/dL — ABNORMAL HIGH (ref 0.40–1.20)
GFR: 40.68 mL/min — ABNORMAL LOW (ref >60.00)
Glucose, Bld: 102 mg/dL — ABNORMAL HIGH (ref 70–99)
Potassium: 4.2 mEq/L (ref 3.5–5.1)
Sodium: 136 mEq/L (ref 135–145)
Total Bilirubin: 0.3 mg/dL (ref 0.2–1.2)
Total Protein: 6.5 g/dL (ref 6.0–8.3)

## 2022-03-03 LAB — CBC WITH DIFFERENTIAL/PLATELET
Basophils Absolute: 0.1 10*3/uL (ref 0.0–0.1)
Basophils Relative: 0.8 % (ref 0.0–3.0)
Eosinophils Absolute: 0.2 10*3/uL (ref 0.0–0.7)
Eosinophils Relative: 2.7 % (ref 0.0–5.0)
HCT: 36.3 % (ref 36.0–46.0)
Hemoglobin: 11.9 g/dL — ABNORMAL LOW (ref 12.0–15.0)
Lymphocytes Relative: 27.3 % (ref 12.0–46.0)
Lymphs Abs: 2.1 10*3/uL (ref 0.7–4.0)
MCHC: 32.8 g/dL (ref 30.0–36.0)
MCV: 94.8 fl (ref 78.0–100.0)
Monocytes Absolute: 0.5 10*3/uL (ref 0.1–1.0)
Monocytes Relative: 6.6 % (ref 3.0–12.0)
Neutro Abs: 4.8 10*3/uL (ref 1.4–7.7)
Neutrophils Relative %: 62.6 % (ref 43.0–77.0)
Platelets: 255 10*3/uL (ref 150.0–400.0)
RBC: 3.83 Mil/uL — ABNORMAL LOW (ref 3.87–5.11)
RDW: 13.5 % (ref 11.5–15.5)
WBC: 7.7 10*3/uL (ref 4.0–10.5)

## 2022-03-03 LAB — LIPID PANEL
Cholesterol: 186 mg/dL (ref 0–200)
HDL: 58 mg/dL (ref >39.00)
LDL Cholesterol: 108 mg/dL — ABNORMAL HIGH (ref 0–99)
NonHDL: 127.82
Total CHOL/HDL Ratio: 3
Triglycerides: 100 mg/dL (ref 0.0–149.0)
VLDL: 20 mg/dL (ref 0.0–40.0)

## 2022-03-03 LAB — VITAMIN D 25 HYDROXY (VIT D DEFICIENCY, FRACTURES): VITD: 12.62 ng/mL — ABNORMAL LOW (ref 30.00–100.00)

## 2022-03-03 LAB — TSH: TSH: 3.78 u[IU]/mL (ref 0.35–5.50)

## 2022-03-03 LAB — HEMOGLOBIN A1C: Hgb A1c MFr Bld: 6.1 % (ref 4.6–6.5)

## 2022-03-03 NOTE — Progress Notes (Signed)
Subjective:    Patient ID: Julie Rush, female    DOB: [birth year], 71 y.o.   MRN: [id number]  CC: Julie Rush is a 71 y.o. female who presents today for physical exam.    HPI: Feels well today No new complaints  HTN- Compliant with metoprolol succinate 50 mg. No cp, sob  GAD-compliant with Wellbutrin 300 mg and states well control of anxiety. Denies depression  No si/hi     Chronic bilateral knee pain.  She continues to follow with orthopedics for corticosteroid injections every 6 months  Colorectal Cancer Screening: UTD , Dr Haig Prophet, repeat in 5 years Breast Cancer Screening: Mammogram UTD Cervical Cancer Screening: No longer screening for cervical cancer.  She has history of  hysterectomy for noncancerous reasons. No vaginal bleeding or pelvic pain.   Bone Health screening/DEXA for 65+: Due; ordered previously  Lung Cancer Screening: Doesn't have 20 year pack year history and age > 23 years yo 61 years  AAA screen - done 12/25/2020        Tetanus - UTD        Pneumococcal -complete Labs: Screening labs today. Exercise: No regular exercise.   Alcohol use:  none Smoking/tobacco use: Former smoker, quit greater than 15 years ago   HISTORY:  Past Medical History:  Diagnosis Date   Anginal pain ([place])    Arthritis    "knees, back, fingers, shoulders, neck"   Chronic lower back pain    Depression 06/08/12   "related to not getting definitive dx of what's wrong"   Exertional dyspnea    GERD (gastroesophageal reflux disease)    H/O hiatal hernia    Headache(784.0)    History of stomach ulcers 1980's   Hypertension    Migraines    4-5x/yr   Pneumonia    PONV (postoperative nausea and vomiting)    Seasonal asthma    Sleep apnea 2013   "dx'd borderline"   Type II diabetes mellitus ([place]) 2013   "borderline"   Vertigo    last episode over 6 mos ago    Past Surgical History:  Procedure Laterality Date   APPENDECTOMY  1960's   BREAST BIOPSY Right     right   CATARACT EXTRACTION W/PHACO Left 11/01/2018   Procedure: CATARACT EXTRACTION PHACO AND INTRAOCULAR LENS PLACEMENT ([place]) LEFT;  Surgeon: Eulogio Bear, MD;  Location: [facility];  Service: Ophthalmology;  Laterality: Left;   CATARACT EXTRACTION W/PHACO Right 11/29/2018   Procedure: CATARACT EXTRACTION PHACO AND INTRAOCULAR LENS PLACEMENT ([facility]) RIGHT;  Surgeon: Eulogio Bear, MD;  Location: [facility];  Service: Ophthalmology;  Laterality: Right;  Diabetes - diet conrolled sleep apnea   CHOLECYSTECTOMY  2006   COLONOSCOPY WITH PROPOFOL N/A 10/30/2020   Procedure: COLONOSCOPY WITH PROPOFOL;  Surgeon: Lesly Rubenstein, MD;  Location: [facility];  Service: Endoscopy;  Laterality: N/A;   EXPLORATORY LAPAROTOMY  late 1990's   "blocked colon S/P hernia repair"   HERNIA REPAIR     "@ least 3 abdominal hernia repairs"   LUMBAR [facility] ARTHROSCOPY WITH ROTATOR CUFF REPAIR AND SUBACROMIAL DECOMPRESSION Right 05/25/2019   Procedure: Right shoulder mini open rotator cuff repair, subacromial decompression;  Surgeon: Susa Day, MD;  Location: [facility];  Service: Orthopedics;  Laterality: Right;  90 mins   VAGINAL HYSTERECTOMY  1981   Family History  Problem Relation Age of Onset   Arthritis Mother    Cancer Father  prostate   Hyperlipidemia Father    Alcohol abuse Father    Arthritis Father    Heart disease Father    Stroke Father    Hypertension Father    Diabetes Father    Cancer Sister        ovary   Arthritis Sister    Cancer Brother        thyroid   Arthritis Brother    Cancer Sister        breast   Arthritis Sister    Breast cancer Sister 38   Arthritis Maternal Grandmother    Arthritis Maternal Grandfather       ALLERGIES: Codeine, Prednisone, Sulfa antibiotics, and Trimethoprim  Current Outpatient Medications on File Prior to Visit  Medication Sig Dispense Refill   acetaminophen (TYLENOL) 500 MG tablet Take  1,000 mg by mouth every 6 (six) hours as needed for moderate pain or headache.      albuterol (VENTOLIN HFA) 108 (90 Base) MCG/ACT inhaler INHALE 2 PUFFS BY MOUTH EVERY 6 HOURS AS NEEDED FOR WHEEZE OR SHORTNESS OF BREATH 6.7 each 1   b complex vitamins tablet Take 2 tablets by mouth daily.     buPROPion (WELLBUTRIN XL) 300 MG 24 hr tablet TAKE 1 TABLET (300 MG TOTAL) BY MOUTH EVERY MORNING. 90 tablet 1   ipratropium (ATROVENT) 0.06 % nasal spray Place 2 sprays into both nostrils 4 (four) times daily. 15 mL 12   methocarbamol (ROBAXIN) 500 MG tablet TAKE 1 TABLET BY MOUTH EVERY 8 HOURS AS NEEDED FOR MUSCLE SPASMS. 20 tablet 1   metoprolol succinate (TOPROL-XL) 50 MG 24 hr tablet Take one tablet with or immediately following a meal. 90 tablet 3   omeprazole (PRILOSEC) 20 MG capsule Take 20 mg by mouth daily as needed (heartburn).      triamcinolone cream (KENALOG) 0.1 % Apply 1 application topically 2 (two) times daily as needed for itching.     benzonatate (TESSALON) 100 MG capsule Take 2 capsules (200 mg total) by mouth every 8 (eight) hours. (Patient not taking: Reported on 03/03/2022) 21 capsule 0   promethazine-dextromethorphan (PROMETHAZINE-DM) 6.25-15 MG/5ML syrup Take 5 mLs by mouth 4 (four) times daily as needed. (Patient not taking: Reported on 03/03/2022) 118 mL 0   No current facility-administered medications on file prior to visit.    Social History   Tobacco Use   Smoking status: Former    Packs/day: 2.00    Years: 14.00    Pack years: 28.00    Types: Cigarettes    Quit date: 11/08/1975    Years since quitting: 46.3   Smokeless tobacco: Never  Vaping Use   Vaping Use: Never used  Substance Use Topics   Alcohol use: No    Alcohol/week: 0.0 standard drinks   Drug use: No    Review of Systems  Constitutional:  Negative for chills, fever and unexpected weight change.  HENT:  Negative for congestion.   Respiratory:  Negative for cough.   Cardiovascular:  Negative for chest  pain, palpitations and leg swelling.  Gastrointestinal:  Negative for nausea and vomiting.  Musculoskeletal:  Negative for arthralgias and myalgias.  Skin:  Negative for rash.  Neurological:  Negative for headaches.  Hematological:  Negative for adenopathy.  Psychiatric/Behavioral:  Negative for confusion.      Objective:    BP 130/78 (BP Location: Left Arm, Patient Position: Sitting, Cuff Size: Large)    Pulse 77    Temp 98.7 F (37.1 C) (Oral)  Ht _0  (1.651 m)    Wt 272 lb 6.4 oz (123.6 kg)    SpO2 98%    BMI 45.33 kg/m   BP Readings from Last 3 Encounters:  03/03/22 130/78  05/29/21 115/69  12/06/20 128/84   Wt Readings from Last 3 Encounters:  03/03/22 272 lb 6.4 oz (123.6 kg)  05/29/21 260 lb (117.9 kg)  01/18/21 255 lb (115.7 kg)    Physical Exam Vitals reviewed.  Constitutional:      Appearance: She is well-developed.  Eyes:     Conjunctiva/sclera: Conjunctivae normal.  Neck:     Thyroid: No thyroid mass or thyromegaly.  Cardiovascular:     Rate and Rhythm: Normal rate and regular rhythm.     Pulses: Normal pulses.     Heart sounds: Normal heart sounds.  Pulmonary:     Effort: Pulmonary effort is normal.     Breath sounds: Normal breath sounds. No wheezing, rhonchi or rales.  Chest:  Breasts:    Breasts are symmetrical.     Right: No inverted nipple, mass, nipple discharge, skin change or tenderness.     Left: No inverted nipple, mass, nipple discharge, skin change or tenderness.     Comments: Unable to ascend the exam table due to bilateral chronic knee pain.  She preferred to have exam done sitting in her chair Lymphadenopathy:     Head:     Right side of head: No submental, submandibular, tonsillar, preauricular, posterior auricular or occipital adenopathy.     Left side of head: No submental, submandibular, tonsillar, preauricular, posterior auricular or occipital adenopathy.     Cervical: No cervical adenopathy.     Right cervical: No superficial,  deep or posterior cervical adenopathy.    Left cervical: No superficial, deep or posterior cervical adenopathy.  Skin:    General: Skin is warm and dry.  Neurological:     Mental Status: She is alert.  Psychiatric:        Speech: Speech normal.        Behavior: Behavior normal.        Thought Content: Thought content normal.       Assessment & Plan:   Problem List Items Addressed This Visit       Cardiovascular and Mediastinum   Essential hypertension, benign    Chronic stable. Continue metoprolol succinate 50 mg        Other   Hyperlipidemia   Relevant Orders   Lipid panel   Routine physical examination - Primary    Clinical breast exam performed today.  Colonoscopy is up-to-date.  She declines any further screening for cervical cancer in the absence of complaints.  She will schedule mammogram and bone density.  I have ordered these.  Encouraged water aerobics for exercise.       Relevant Orders   MM 3D SCREEN BREAST BILATERAL   DG Bone Density   TSH   Comp Met (CMET)   Vitamin D (25 hydroxy)   Other Visit Diagnoses     Encounter for screening mammogram for malignant neoplasm of breast       Relevant Orders   MM 3D SCREEN BREAST BILATERAL   Asymptomatic age-related postmenopausal state       Relevant Orders   DG Bone Density   Vitamin D deficiency       Relevant Orders   Vitamin D (25 hydroxy)   Controlled type 2 diabetes mellitus with diabetic nephropathy, unspecified whether long term insulin use ([place])  Relevant Orders   HgB A1c   Anemia, unspecified type       Relevant Orders   CBC w/Diff        I am having Julie Rush maintain her acetaminophen, omeprazole, b complex vitamins, triamcinolone cream, metoprolol succinate, benzonatate, ipratropium, promethazine-dextromethorphan, buPROPion, methocarbamol, and albuterol.   No orders of the defined types were placed in this encounter.   Return precautions given.   Risks, benefits, and  alternatives of the medications and treatment plan prescribed today were discussed, and patient expressed understanding.   Education regarding symptom management and diagnosis given to patient on AVS.   Continue to follow with Burnard Hawthorne, FNP for routine health maintenance.   Linna Hoff and I agreed with plan.   Mable Paris, FNP

## 2022-03-03 NOTE — Assessment & Plan Note (Signed)
Chronic stable. Continue metoprolol succinate 50 mg ?

## 2022-03-03 NOTE — Patient Instructions (Addendum)
Please call  and schedule your 3D mammogram and /or bone density scan as we discussed. ? ?Health Maintenance for Postmenopausal Women ?Menopause is a normal process in which your ability to get pregnant comes to an end. This process happens slowly over many months or years, usually between the ages of 84 and 16. Menopause is complete when you have missed your menstrual period for 12 months. ?It is important to talk with your health care provider about some of the most common conditions that affect women after menopause (postmenopausal women). These include heart disease, cancer, and bone loss (osteoporosis). Adopting a healthy lifestyle and getting preventive care can help to promote your health and wellness. The actions you take can also lower your chances of developing some of these common conditions. ?What are the signs and symptoms of menopause? ?During menopause, you may have the following symptoms: ?Hot flashes. These can be moderate or severe. ?Night sweats. ?Decrease in sex drive. ?Mood swings. ?Headaches. ?Tiredness (fatigue). ?Irritability. ?Memory problems. ?Problems falling asleep or staying asleep. ?Talk with your health care provider about treatment options for your symptoms. ?Do I need hormone replacement therapy? ?Hormone replacement therapy is effective in treating symptoms that are caused by menopause, such as hot flashes and night sweats. ?Hormone replacement carries certain risks, especially as you become older. If you are thinking about using estrogen or estrogen with progestin, discuss the benefits and risks with your health care provider. ?How can I reduce my risk for heart disease and stroke? ?The risk of heart disease, heart attack, and stroke increases as you age. One of the causes may be a change in the body's hormones during menopause. This can affect how your body uses dietary fats, triglycerides, and cholesterol. Heart attack and stroke are medical emergencies. There are many things  that you can do to help prevent heart disease and stroke. ?Watch your blood pressure ?High blood pressure causes heart disease and increases the risk of stroke. This is more likely to develop in people who have high blood pressure readings or are overweight. ?Have your blood pressure checked: ?Every 3-5 years if you are 42-68 years of age. ?Every year if you are 62 years old or older. ?Eat a healthy diet ? ?Eat a diet that includes plenty of vegetables, fruits, low-fat dairy products, and lean protein. ?Do not eat a lot of foods that are high in solid fats, added sugars, or sodium. ?Get regular exercise ?Get regular exercise. This is one of the most important things you can do for your health. Most adults should: ?Try to exercise for at least 150 minutes each week. The exercise should increase your heart rate and make you sweat (moderate-intensity exercise). ?Try to do strengthening exercises at least twice each week. Do these in addition to the moderate-intensity exercise. ?Spend less time sitting. Even light physical activity can be beneficial. ?Other tips ?Work with your health care provider to achieve or maintain a healthy weight. ?Do not use any products that contain nicotine or tobacco. These products include cigarettes, chewing tobacco, and vaping devices, such as e-cigarettes. If you need help quitting, ask your health care provider. ?Know your numbers. Ask your health care provider to check your cholesterol and your blood sugar (glucose). Continue to have your blood tested as directed by your health care provider. ?Do I need screening for cancer? ?Depending on your health history and family history, you may need to have cancer screenings at different stages of your life. This may include screening for: ?Breast cancer. ?  Cervical cancer. ?Lung cancer. ?Colorectal cancer. ?What is my risk for osteoporosis? ?After menopause, you may be at increased risk for osteoporosis. Osteoporosis is a condition in which  bone destruction happens more quickly than new bone creation. To help prevent osteoporosis or the bone fractures that can happen because of osteoporosis, you may take the following actions: ?If you are 83-14 years old, get at least 1,000 mg of calcium and at least 600 international units (IU) of vitamin D per day. ?If you are older than age 68 but younger than age 15, get at least 1,200 mg of calcium and at least 600 international units (IU) of vitamin D per day. ?If you are older than age 76, get at least 1,200 mg of calcium and at least 800 international units (IU) of vitamin D per day. ?Smoking and drinking excessive alcohol increase the risk of osteoporosis. Eat foods that are rich in calcium and vitamin D, and do weight-bearing exercises several times each week as directed by your health care provider. ?How does menopause affect my mental health? ?Depression may occur at any age, but it is more common as you become older. Common symptoms of depression include: ?Feeling depressed. ?Changes in sleep patterns. ?Changes in appetite or eating patterns. ?Feeling an overall lack of motivation or enjoyment of activities that you previously enjoyed. ?Frequent crying spells. ?Talk with your health care provider if you think that you are experiencing any of these symptoms. ?General instructions ?See your health care provider for regular wellness exams and vaccines. This may include: ?Scheduling regular health, dental, and eye exams. ?Getting and maintaining your vaccines. These include: ?Influenza vaccine. Get this vaccine each year before the flu season begins. ?Pneumonia vaccine. ?Shingles vaccine. ?Tetanus, diphtheria, and pertussis (Tdap) booster vaccine. ?Your health care provider may also recommend other immunizations. ?Tell your health care provider if you have ever been abused or do not feel safe at home. ?Summary ?Menopause is a normal process in which your ability to get pregnant comes to an end. ?This condition  causes hot flashes, night sweats, decreased interest in sex, mood swings, headaches, or lack of sleep. ?Treatment for this condition may include hormone replacement therapy. ?Take actions to keep yourself healthy, including exercising regularly, eating a healthy diet, watching your weight, and checking your blood pressure and blood sugar levels. ?Get screened for cancer and depression. Make sure that you are up to date with all your vaccines. ?This information is not intended to replace advice given to you by your health care provider. Make sure you discuss any questions you have with your health care provider. ?Document Revised: 04/29/2021 Document Reviewed: 04/29/2021 ?Elsevier Patient Education ? 2022 [place]. ? ? ?

## 2022-03-03 NOTE — Assessment & Plan Note (Signed)
Chronic, stable.  Continue Wellbutrin 300 mg 

## 2022-03-03 NOTE — Assessment & Plan Note (Addendum)
Clinical breast exam performed today.  Colonoscopy is up-to-date.  She declines any further screening for cervical cancer in the absence of complaints.  She will schedule mammogram and bone density.  I have ordered these.  Encouraged water aerobics for exercise.  ?

## 2022-03-06 ENCOUNTER — Telehealth: Payer: Self-pay

## 2022-03-06 NOTE — Telephone Encounter (Signed)
LMTCB for to discuss lab result.  ?

## 2022-03-10 ENCOUNTER — Other Ambulatory Visit: Payer: Self-pay | Admitting: Family

## 2022-03-10 DIAGNOSIS — Z780 Asymptomatic menopausal state: Secondary | ICD-10-CM

## 2022-03-10 DIAGNOSIS — Z0000 Encounter for general adult medical examination without abnormal findings: Secondary | ICD-10-CM

## 2022-03-10 NOTE — Telephone Encounter (Signed)
Pt returning call

## 2022-03-14 NOTE — Telephone Encounter (Signed)
See result note.  

## 2022-03-19 ENCOUNTER — Other Ambulatory Visit (INDEPENDENT_AMBULATORY_CARE_PROVIDER_SITE_OTHER): Payer: Medicare Other

## 2022-03-19 DIAGNOSIS — D649 Anemia, unspecified: Secondary | ICD-10-CM

## 2022-03-20 LAB — IBC + FERRITIN
Ferritin: 51.7 ng/mL (ref 10.0–291.0)
Iron: 60 ug/dL (ref 42–145)
Saturation Ratios: 20.8 % (ref 20.0–50.0)
TIBC: 288.4 ug/dL (ref 250.0–450.0)
Transferrin: 206 mg/dL — ABNORMAL LOW (ref 212.0–360.0)

## 2022-03-20 LAB — BASIC METABOLIC PANEL
BUN: 28 mg/dL — ABNORMAL HIGH (ref 6–23)
CO2: 28 mEq/L (ref 19–32)
Calcium: 10 mg/dL (ref 8.4–10.5)
Chloride: 101 mEq/L (ref 96–112)
Creatinine, Ser: 1.31 mg/dL — ABNORMAL HIGH (ref 0.40–1.20)
GFR: 40.67 mL/min — ABNORMAL LOW (ref >60.00)
Glucose, Bld: 89 mg/dL (ref 70–99)
Potassium: 4.7 mEq/L (ref 3.5–5.1)
Sodium: 136 mEq/L (ref 135–145)

## 2022-03-20 LAB — B12 AND FOLATE PANEL
Folate: 23.3 ng/mL (ref >5.9)
Vitamin B-12: 893 pg/mL (ref 211–911)

## 2022-03-24 ENCOUNTER — Other Ambulatory Visit: Payer: Self-pay

## 2022-03-24 ENCOUNTER — Telehealth: Payer: Self-pay | Admitting: Family

## 2022-03-24 DIAGNOSIS — I10 Essential (primary) hypertension: Secondary | ICD-10-CM

## 2022-03-24 DIAGNOSIS — R944 Abnormal results of kidney function studies: Secondary | ICD-10-CM

## 2022-03-24 NOTE — Telephone Encounter (Signed)
I?ve had time to think about the phone call saying this upcoming telephone appt. Is for Medicare wellness questions.  ?I have a question. I do not understand and is this mandatory? ?Thank you. ? ? ?Mychart, Generic  Linna Hoff 3 days ago ? ?GM ?Appointment Information: ?    Visit Type: Annual Wellness Visit ?        Date: 03/25/2022 ?                Dept: [facility] ?                Provider: [place] ?                Time: 1:00 PM ?                Length: 30 min ?  ?Appt Status: Scheduled ?  ?  ? ? ?Mychart, Generic  Linna Hoff 3 days ago ? ?GM ?Appointment Information: ?    Visit Type: Annual Wellness Visit ?        Date: 03/25/2022 ?                Dept: [facility] ?                Provider: [place] ?                Time: 1:00 PM ?                Length: 45 min ?  ?Appt Status: Scheduled ?  ?  ? ?

## 2022-03-25 ENCOUNTER — Ambulatory Visit: Payer: Medicare Other

## 2022-03-25 NOTE — Telephone Encounter (Signed)
Spoke with patient to assist in answering questions listed in previous message. Patient rescheduled appointment for later date.  ?

## 2022-04-01 ENCOUNTER — Telehealth: Payer: Self-pay | Admitting: Family

## 2022-04-01 DIAGNOSIS — K921 Melena: Secondary | ICD-10-CM

## 2022-04-01 LAB — FECAL OCCULT BLOOD, IMMUNOCHEMICAL: Fecal Occult Bld: POSITIVE — AB

## 2022-04-01 NOTE — Addendum Note (Signed)
Addended by: Burnard Hawthorne on: 04/01/2022 02:05 PM ? ? Modules accepted: Orders ? ?

## 2022-04-01 NOTE — Telephone Encounter (Signed)
CRITICAL VALUE STICKER ? ?CRITICAL VALUE: Positive IFOB ? ?RECEIVER (on-site recipient of call):Donovin Kraemer ? ?DATE & TIME NOTIFIED: 04/01/2022 9:37am ? ?MESSENGER (representative from lab):Margarita Grizzle ? ?MD NOTIFIED: Dr. Nicki Reaper ? ?TIME OF NOTIFICATION: 9:40am ? ?RESPONSE:   ?

## 2022-04-01 NOTE — Telephone Encounter (Signed)
Call pt ?Hemoccult stool cards are positive for blood.  This may be related to internal hemorrhoids which were noted on previous colonoscopy.   ? ?Please patient if she has noticed any blood in her stool or bright red blood from rectum?   ? ?In the setting of anemia and positive stool cards, I placed an urgent referral to Dr. Andrey Farmer whom she seen in the past, gastroenterology.  ? ? She may also call his office as well to expedite appointment. ?Let us know if you dont hear back within a week in regards to an appointment being scheduled.  ? ?

## 2022-04-04 ENCOUNTER — Encounter: Payer: Self-pay | Admitting: Family

## 2022-04-07 DIAGNOSIS — Z20822 Contact with and (suspected) exposure to covid-19: Secondary | ICD-10-CM | POA: Diagnosis not present

## 2022-04-07 NOTE — Telephone Encounter (Signed)
Spoke to patient about note in regards to blood occult cards. Patient verbalized understanding and already has appointment scheduled with GI ?

## 2022-04-23 DIAGNOSIS — R051 Acute cough: Secondary | ICD-10-CM | POA: Diagnosis not present

## 2022-04-23 DIAGNOSIS — Z20822 Contact with and (suspected) exposure to covid-19: Secondary | ICD-10-CM | POA: Diagnosis not present

## 2022-04-23 DIAGNOSIS — R059 Cough, unspecified: Secondary | ICD-10-CM | POA: Diagnosis not present

## 2022-04-24 ENCOUNTER — Other Ambulatory Visit: Payer: Self-pay | Admitting: Nephrology

## 2022-04-24 DIAGNOSIS — N1832 Chronic kidney disease, stage 3b: Secondary | ICD-10-CM

## 2022-04-24 DIAGNOSIS — I10 Essential (primary) hypertension: Secondary | ICD-10-CM | POA: Diagnosis not present

## 2022-04-24 DIAGNOSIS — D631 Anemia in chronic kidney disease: Secondary | ICD-10-CM | POA: Diagnosis not present

## 2022-04-25 ENCOUNTER — Telehealth: Payer: Self-pay | Admitting: Family

## 2022-04-25 NOTE — Telephone Encounter (Signed)
Rasheedah ?Has pt seen GI, Dr Haig Prophet? Can update me on status?  ? ?She has blood in her stool and anemia so she needs to be seen urgently ? ?[place]  ?Please reach out to pt and let her know that we are working to expedite.  ?Ask her to et [place] know if you dont hear back early next week in regards to an appointment being scheduled asap.  ? ? ? ?

## 2022-04-25 NOTE — Telephone Encounter (Signed)
Called patient and left message for her to call back to office ?

## 2022-04-25 NOTE — Telephone Encounter (Signed)
Spoke to patient and she has not seen GI as of yet. Patient has appointment in June but I will keep trying to see if they can see her sooner. ?

## 2022-04-28 NOTE — Telephone Encounter (Signed)
noted 

## 2022-04-28 NOTE — Telephone Encounter (Signed)
Please call gi, dr Haig Prophet and see if she can be placed on cancellation list ? ?

## 2022-04-28 NOTE — Telephone Encounter (Signed)
Spoke to receptionist at GI Dr. Drinda Butts office, and was able to get her added to the cancellation list. ?

## 2022-04-30 ENCOUNTER — Ambulatory Visit (INDEPENDENT_AMBULATORY_CARE_PROVIDER_SITE_OTHER): Payer: Medicare Other | Admitting: Family

## 2022-04-30 ENCOUNTER — Encounter: Payer: Self-pay | Admitting: Family

## 2022-04-30 VITALS — BP 128/78 | HR 69 | Temp 97.7°F | Ht 66.0 in | Wt 269.4 lb

## 2022-04-30 DIAGNOSIS — I10 Essential (primary) hypertension: Secondary | ICD-10-CM

## 2022-04-30 DIAGNOSIS — D649 Anemia, unspecified: Secondary | ICD-10-CM | POA: Diagnosis not present

## 2022-04-30 NOTE — Patient Instructions (Signed)
Continue to call Dr Haig Prophet to schedule sooner appointment.  ? ?Phone: [phone] ?

## 2022-04-30 NOTE — Addendum Note (Signed)
Addended by: Leeanne Rio on: 04/30/2022 11:58 AM ? ? Modules accepted: Orders ? ?

## 2022-04-30 NOTE — Assessment & Plan Note (Addendum)
Normocytic anemia. ? Anemia of chronic disease. Patient declines screen for iron stores today as she prefers not to do blood work today.  MCV normal so I think low iron stores would be less likely.  B12 is adequate.  No rectal bleeding at this time.  She is pending appointment with Dr. Haig Prophet next month.  ? ?Lab Results  ?Component Value Date  ? WBC 7.7 03/03/2022  ? HGB 11.9 (L) 03/03/2022  ? HCT 36.3 03/03/2022  ? MCV 94.8 03/03/2022  ? PLT 255.0 03/03/2022  ? ? ?

## 2022-04-30 NOTE — Progress Notes (Signed)
? ?Subjective:  ? ? Patient ID: Julie Rush, female    DOB: 10-Sep-1951, 71 y.o.   MRN: [id number] ? ?CC: Julie Rush is a 71 y.o. female who presents today for follow up.  ? ?HPI: Feels well today.  No new complaints ? ?Hypertension-compliant with metoprolol succinate 50 mg . No cp.  ? ? ?CKD-she is avoiding NSAIDs .  ? ?consult Dr. Holley Raring, nephrology  ?04/24/2022.  Patient is to follow-up in 3 weeks time ? ?Anemia-positive occult stool.  No rectal bleeding, abdominal pain or blood in stool. ?No known history of hemorrhoids.  Internal hemorrhoids seen on previous colonoscopy 10/30/2020 ? ?Upcoming appointment with gastroenterology, Dr. Haig Prophet ?HISTORY:  ?Past Medical History:  ?Diagnosis Date  ? Anginal pain ([place])   ? Arthritis   ? "knees, back, fingers, shoulders, neck"  ? Chronic lower back pain   ? Depression 06/08/12  ? "related to not getting definitive dx of what's wrong"  ? Exertional dyspnea   ? GERD (gastroesophageal reflux disease)   ? H/O hiatal hernia   ? Headache(784.0)   ? History of stomach ulcers 1980's  ? Hypertension   ? Migraines   ? 4-5x/yr  ? Pneumonia   ? PONV (postoperative nausea and vomiting)   ? Seasonal asthma   ? Sleep apnea 2013  ? "dx'd borderline"  ? Type II diabetes mellitus ([place]) 2013  ? "borderline"  ? Vertigo   ? last episode over 6 mos ago  ? ?Past Surgical History:  ?Procedure Laterality Date  ? APPENDECTOMY  1960's  ? BREAST BIOPSY Right   ? right  ? CATARACT EXTRACTION W/PHACO Left 11/01/2018  ? Procedure: CATARACT EXTRACTION PHACO AND INTRAOCULAR LENS PLACEMENT ([place]) LEFT;  Surgeon: Eulogio Bear, MD;  Location: [place];  Service: Ophthalmology;  Laterality: Left;  ? CATARACT EXTRACTION W/PHACO Right 11/29/2018  ? Procedure: CATARACT EXTRACTION PHACO AND INTRAOCULAR LENS PLACEMENT ([place]) RIGHT;  Surgeon: Eulogio Bear, MD;  Location: [place];  Service: Ophthalmology;  Laterality: Right;  Diabetes - diet conrolled ?sleep apnea  ?  CHOLECYSTECTOMY  2006  ? COLONOSCOPY WITH PROPOFOL N/A 10/30/2020  ? Procedure: COLONOSCOPY WITH PROPOFOL;  Surgeon: Lesly Rubenstein, MD;  [facility] END[facility]COPY;  Service: Endoscopy;  Laterality: N/A;  ? EXPLORATORY LAPAROTOMY  late 1990's  ? "blocked colon S/P hernia repair"  ? HERNIA REPAIR    ? "@ least 3 abdominal hernia repairs"  ? [place] SURGERY  1981  ? SHOULDER ARTHROSCOPY WITH ROTATOR CUFF REPAIR AND SUBACROMIAL DECOMPRESSION Right 05/25/2019  ? Procedure: Right shoulder mini open rotator cuff repair, subacromial decompression;  Surgeon: Susa Day, MD;  Location: [facility];  Service: Orthopedics;  Laterality: Right;  90 mins  ? VAGINAL HYSTERECTOMY  1981  ? ?Family History  ?Problem Relation Age of Onset  ? Arthritis Mother   ? Cancer Father   ?     prostate  ? Hyperlipidemia Father   ? Alcohol abuse Father   ? Arthritis Father   ? Heart disease Father   ? Stroke Father   ? Hypertension Father   ? Diabetes Father   ? Cancer Sister   ?     ovary  ? Arthritis Sister   ? Cancer Brother   ?     thyroid  ? Arthritis Brother   ? Cancer Sister   ?     breast  ? Arthritis Sister   ? Breast cancer Sister 9  ? Arthritis Maternal Grandmother   ?  Arthritis Maternal Grandfather   ? ? ?Allergies: Codeine, Prednisone, Sulfa antibiotics, and Trimethoprim ?Current Outpatient Medications on File Prior to Visit  ?Medication Sig Dispense Refill  ? acetaminophen (TYLENOL) 500 MG tablet Take 1,000 mg by mouth every 6 (six) hours as needed for moderate pain or headache.     ? albuterol (VENTOLIN HFA) 108 (90 Base) MCG/ACT inhaler INHALE 2 PUFFS BY MOUTH EVERY 6 HOURS AS NEEDED FOR WHEEZE OR SHORTNESS OF BREATH 6.7 each 1  ? b complex vitamins tablet Take 2 tablets by mouth daily.    ? buPROPion (WELLBUTRIN XL) 300 MG 24 hr tablet TAKE 1 TABLET (300 MG TOTAL) BY MOUTH EVERY MORNING. 90 tablet 1  ? ipratropium (ATROVENT) 0.06 % nasal spray Place 2 sprays into both nostrils 4 (four) times daily. 15 mL 12  ? methocarbamol  (ROBAXIN) 500 MG tablet TAKE 1 TABLET BY MOUTH EVERY 8 HOURS AS NEEDED FOR MUSCLE SPASMS. 20 tablet 1  ? metoprolol succinate (TOPROL-XL) 50 MG 24 hr tablet Take one tablet with or immediately following a meal. 90 tablet 3  ? omeprazole (PRILOSEC) 20 MG capsule Take 20 mg by mouth daily as needed (heartburn).     ? triamcinolone cream (KENALOG) 0.1 % Apply 1 application topically 2 (two) times daily as needed for itching.    ? benzonatate (TESSALON) 100 MG capsule Take 2 capsules (200 mg total) by mouth every 8 (eight) hours. (Patient not taking: Reported on 03/03/2022) 21 capsule 0  ? promethazine-dextromethorphan (PROMETHAZINE-DM) 6.25-15 MG/5ML syrup Take 5 mLs by mouth 4 (four) times daily as needed. (Patient not taking: Reported on 03/03/2022) 118 mL 0  ? ?No current facility-administered medications on file prior to visit.  ? ? ?Social History  ? ?Tobacco Use  ? Smoking status: Former  ?  Packs/day: 2.00  ?  Years: 14.00  ?  Pack years: 28.00  ?  Types: Cigarettes  ?  Quit date: 11/08/1975  ?  Years since quitting: 46.5  ? Smokeless tobacco: Never  ?Vaping Use  ? Vaping Use: Never used  ?Substance Use Topics  ? Alcohol use: No  ?  Alcohol/week: 0.0 standard drinks  ? Drug use: No  ? ? ?Review of Systems  ?Constitutional:  Negative for chills and fever.  ?Respiratory:  Negative for cough.   ?Cardiovascular:  Negative for chest pain and palpitations.  ?Gastrointestinal:  Negative for blood in stool, nausea and vomiting.  ?   ?Objective:  ?  ?BP 128/78 (BP Location: Left Arm, Patient Position: Sitting, Cuff Size: Large)   Pulse 69   Temp 97.7 ?F (36.5 ?C) (Oral)   Ht '5\' 6"'$  (1.676 m)   Wt 269 lb 6.4 oz (122.2 kg)   SpO2 99%   BMI 43.48 kg/m?  ?BP Readings from Last 3 Encounters:  ?04/30/22 128/78  ?03/03/22 130/78  ?05/29/21 115/69  ? ?Wt Readings from Last 3 Encounters:  ?04/30/22 269 lb 6.4 oz (122.2 kg)  ?03/03/22 272 lb 6.4 oz (123.6 kg)  ?05/29/21 260 lb (117.9 kg)  ? ? ?Physical Exam ?Vitals reviewed.   ?Constitutional:   ?   Appearance: She is well-developed.  ?Eyes:  ?   Conjunctiva/sclera: Conjunctivae normal.  ?Cardiovascular:  ?   Rate and Rhythm: Normal rate and regular rhythm.  ?   Pulses: Normal pulses.  ?   Heart sounds: Normal heart sounds.  ?Pulmonary:  ?   Effort: Pulmonary effort is normal.  ?   Breath sounds: Normal breath sounds. No wheezing, rhonchi  or rales.  ?Skin: ?   General: Skin is warm and dry.  ?Neurological:  ?   Mental Status: She is alert.  ?Psychiatric:     ?   Speech: Speech normal.     ?   Behavior: Behavior normal.     ?   Thought Content: Thought content normal.  ? ? ?   ?Assessment & Plan:  ? ?Problem List Items Addressed This Visit   ? ?  ? Cardiovascular and Mediastinum  ? Essential hypertension, benign - Primary  ?  Chronic, stable.  Continue metoprolol succinate 50 mg ? ?  ?  ? Relevant Orders  ? Basic metabolic panel  ?  ? Other  ? Anemia  ?  Normocytic anemia. ? Anemia of chronic disease. Patient declines screen for iron stores today as she prefers not to do blood work today.  MCV normal so I think low iron stores would be less likely.  B12 is adequate.  No rectal bleeding at this time.  She is pending appointment with Dr. Haig Prophet next month.  ? ?Lab Results  ?Component Value Date  ? WBC 7.7 03/03/2022  ? HGB 11.9 (L) 03/03/2022  ? HCT 36.3 03/03/2022  ? MCV 94.8 03/03/2022  ? PLT 255.0 03/03/2022  ? ?  ?  ? ? ? ?I am having Julie Rush maintain her acetaminophen, omeprazole, b complex vitamins, triamcinolone cream, metoprolol succinate, benzonatate, ipratropium, promethazine-dextromethorphan, buPROPion, methocarbamol, and albuterol. ? ? ?No orders of the defined types were placed in this encounter. ? ? ?Return precautions given.  ? ?Risks, benefits, and alternatives of the medications and treatment plan prescribed today were discussed, and patient expressed understanding.  ? ?Education regarding symptom management and diagnosis given to patient on AVS. ? ?Continue to  follow with Julie Hawthorne, FNP for routine health maintenance.  ? ?Julie Rush and I agreed with plan.  ? ?Julie Paris, FNP ? ? ?

## 2022-04-30 NOTE — Assessment & Plan Note (Signed)
Chronic, stable.  Continue metoprolol succinate 50 mg ?

## 2022-05-02 ENCOUNTER — Ambulatory Visit: Payer: Medicare Other

## 2022-05-05 ENCOUNTER — Ambulatory Visit
Admission: RE | Admit: 2022-05-05 | Discharge: 2022-05-05 | Disposition: A | Discharge status: Home / Self Care | Payer: Medicare Other | Source: Ambulatory Visit | Attending: Family | Admitting: Family

## 2022-05-05 DIAGNOSIS — Z1231 Encounter for screening mammogram for malignant neoplasm of breast: Secondary | ICD-10-CM | POA: Diagnosis not present

## 2022-05-05 DIAGNOSIS — Z0000 Encounter for general adult medical examination without abnormal findings: Secondary | ICD-10-CM

## 2022-05-06 ENCOUNTER — Ambulatory Visit
Admission: RE | Admit: 2022-05-06 | Discharge: 2022-05-06 | Disposition: A | Discharge status: Home / Self Care | Payer: Medicare Other | Source: Ambulatory Visit | Attending: Nephrology | Admitting: Nephrology

## 2022-05-06 DIAGNOSIS — N1330 Unspecified hydronephrosis: Secondary | ICD-10-CM | POA: Diagnosis not present

## 2022-05-06 DIAGNOSIS — N183 Chronic kidney disease, stage 3 unspecified: Secondary | ICD-10-CM | POA: Diagnosis not present

## 2022-05-06 DIAGNOSIS — N1832 Chronic kidney disease, stage 3b: Secondary | ICD-10-CM | POA: Insufficient documentation

## 2022-05-12 ENCOUNTER — Other Ambulatory Visit: Payer: Self-pay | Admitting: Family

## 2022-05-12 DIAGNOSIS — I10 Essential (primary) hypertension: Secondary | ICD-10-CM

## 2022-05-22 DIAGNOSIS — N1832 Chronic kidney disease, stage 3b: Secondary | ICD-10-CM | POA: Diagnosis not present

## 2022-05-22 DIAGNOSIS — I10 Essential (primary) hypertension: Secondary | ICD-10-CM | POA: Diagnosis not present

## 2022-05-22 DIAGNOSIS — D631 Anemia in chronic kidney disease: Secondary | ICD-10-CM | POA: Diagnosis not present

## 2022-05-22 DIAGNOSIS — N1330 Unspecified hydronephrosis: Secondary | ICD-10-CM | POA: Diagnosis not present

## 2022-05-24 ENCOUNTER — Other Ambulatory Visit: Payer: Self-pay | Admitting: Nephrology

## 2022-05-24 DIAGNOSIS — N1330 Unspecified hydronephrosis: Secondary | ICD-10-CM

## 2022-05-26 DIAGNOSIS — R195 Other fecal abnormalities: Secondary | ICD-10-CM | POA: Diagnosis not present

## 2022-05-26 DIAGNOSIS — R6881 Early satiety: Secondary | ICD-10-CM | POA: Diagnosis not present

## 2022-05-26 DIAGNOSIS — Z86010 Personal history of colonic polyps: Secondary | ICD-10-CM | POA: Diagnosis not present

## 2022-05-26 DIAGNOSIS — R1319 Other dysphagia: Secondary | ICD-10-CM | POA: Diagnosis not present

## 2022-05-26 DIAGNOSIS — R1310 Dysphagia, unspecified: Secondary | ICD-10-CM | POA: Diagnosis not present

## 2022-05-26 DIAGNOSIS — K219 Gastro-esophageal reflux disease without esophagitis: Secondary | ICD-10-CM | POA: Diagnosis not present

## 2022-05-27 ENCOUNTER — Ambulatory Visit
Admission: RE | Admit: 2022-05-27 | Discharge: 2022-05-27 | Disposition: A | Discharge status: Home / Self Care | Payer: Medicare Other | Source: Ambulatory Visit | Attending: Nephrology | Admitting: Nephrology

## 2022-05-27 DIAGNOSIS — N281 Cyst of kidney, acquired: Secondary | ICD-10-CM | POA: Diagnosis not present

## 2022-05-27 DIAGNOSIS — N1330 Unspecified hydronephrosis: Secondary | ICD-10-CM | POA: Diagnosis not present

## 2022-05-27 DIAGNOSIS — N183 Chronic kidney disease, stage 3 unspecified: Secondary | ICD-10-CM | POA: Diagnosis not present

## 2022-05-27 DIAGNOSIS — K5730 Diverticulosis of large intestine without perforation or abscess without bleeding: Secondary | ICD-10-CM | POA: Diagnosis not present

## 2022-06-18 ENCOUNTER — Ambulatory Visit (INDEPENDENT_AMBULATORY_CARE_PROVIDER_SITE_OTHER): Payer: Medicare Other | Admitting: Urology

## 2022-06-18 ENCOUNTER — Encounter: Payer: Self-pay | Admitting: Urology

## 2022-06-18 ENCOUNTER — Other Ambulatory Visit
Admission: RE | Admit: 2022-06-18 | Discharge: 2022-06-18 | Disposition: A | Discharge status: Home / Self Care | Payer: Medicare Other | Attending: Urology | Admitting: Urology

## 2022-06-18 VITALS — BP 145/79 | HR 76 | Ht 65.0 in | Wt 267.0 lb

## 2022-06-18 DIAGNOSIS — N1832 Chronic kidney disease, stage 3b: Secondary | ICD-10-CM | POA: Diagnosis not present

## 2022-06-18 DIAGNOSIS — N1330 Unspecified hydronephrosis: Secondary | ICD-10-CM | POA: Insufficient documentation

## 2022-06-18 LAB — URINALYSIS, COMPLETE (UACMP) WITH MICROSCOPIC
Bilirubin Urine: NEGATIVE
Glucose, UA: NEGATIVE mg/dL
Hgb urine dipstick: NEGATIVE
Ketones, ur: NEGATIVE mg/dL
Nitrite: NEGATIVE
Protein, ur: NEGATIVE mg/dL
Specific Gravity, Urine: 1.01 (ref 1.005–1.030)
pH: 5 (ref 5.0–8.0)

## 2022-06-18 NOTE — Patient Instructions (Signed)
Kegel Exercises  Kegel exercises can help strengthen your pelvic floor muscles. The pelvic floor is a group of muscles that support your rectum, small intestine, and bladder. In females, pelvic floor muscles also help support the uterus. These muscles help you control the flow of urine and stool (feces). Kegel exercises are painless and simple. They do not require any equipment. Your provider may suggest Kegel exercises to: Improve bladder and bowel control. Improve sexual response. Improve weak pelvic floor muscles after surgery to remove the uterus (hysterectomy) or after pregnancy, in females. Improve weak pelvic floor muscles after prostate gland removal or surgery, in males. Kegel exercises involve squeezing your pelvic floor muscles. These are the same muscles you squeeze when you try to stop the flow of urine or keep from passing gas. The exercises can be done while sitting, standing, or lying down, but it is best to vary your position. Ask your health care provider which exercises are safe for you. Do exercises exactly as told by your health care provider and adjust them as directed. Do not begin these exercises until told by your health care provider. Exercises How to do Kegel exercises: Squeeze your pelvic floor muscles tight. You should feel a tight lift in your rectal area. If you are a female, you should also feel a tightness in your vaginal area. Keep your stomach, buttocks, and legs relaxed. Hold the muscles tight for up to 10 seconds. Breathe normally. Relax your muscles for up to 10 seconds. Repeat as told by your health care provider. Repeat this exercise daily as told by your health care provider. Continue to do this exercise for at least 4-6 weeks, or for as long as told by your health care provider. You may be referred to a physical therapist who can help you learn more about how to do Kegel exercises. Depending on your condition, your health care provider may  recommend: Varying how long you squeeze your muscles. Doing several sets of exercises every day. Doing exercises for several weeks. Making Kegel exercises a part of your regular exercise routine. This information is not intended to replace advice given to you by your health care provider. Make sure you discuss any questions you have with your health care provider. Document Revised: 04/18/2021 Document Reviewed: 04/18/2021 Elsevier Patient Education  2023 Elsevier Inc.  

## 2022-06-18 NOTE — Progress Notes (Signed)
06/18/22 9:21 AM   Linna Hoff 08-31-1951 [id number]  CC: Left hydronephrosis, CKD, stress incontinence  HPI: I saw Ms. Schwanz today for the above issues.  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  A renal ultrasound with nephrology showed possible mild left hydronephrosis which prompted a noncontrast CT.  The noncontrast CT showed no evidence of stones, and some very subtle left hydronephrosis with questionable UPJ obstruction.  She denies any flank pain, gross hematuria, or recurrent UTIs.  She also reports mild stress incontinence with coughing or sneezing.  She has extensive prior abdominal surgeries and hysterectomy.  Urinalysis today with no microscopic hematuria  PMH: Past Medical History:  Diagnosis Date   Anginal pain ([place])    Arthritis    "knees, back, fingers, shoulders, neck"   Chronic lower back pain    Depression 06/08/12   "related to not getting definitive dx of what's wrong"   Exertional dyspnea    GERD (gastroesophageal reflux disease)    H/O hiatal hernia    Headache(784.0)    History of stomach ulcers 1980's   Hypertension    Migraines    4-5x/yr   Pneumonia    PONV (postoperative nausea and vomiting)    Seasonal asthma    Sleep apnea 2013   "dx'd borderline"   Type II diabetes mellitus ([place]) 2013   "borderline"   Vertigo    last episode over 6 mos ago    Surgical History: Past Surgical History:  Procedure Laterality Date   APPENDECTOMY  1960's   BREAST BIOPSY Right    right   CATARACT EXTRACTION W/PHACO Left 11/01/2018   Procedure: CATARACT EXTRACTION PHACO AND INTRAOCULAR LENS PLACEMENT ([place]) LEFT;  Surgeon: Eulogio Bear, MD;  Location: [place];  Service: Ophthalmology;  Laterality: Left;   CATARACT EXTRACTION W/PHACO Right 11/29/2018   Procedure: CATARACT EXTRACTION PHACO AND INTRAOCULAR LENS PLACEMENT ([place]) RIGHT;   Surgeon: Eulogio Bear, MD;  Location: [place];  Service: Ophthalmology;  Laterality: Right;  Diabetes - diet conrolled sleep apnea   CHOLECYSTECTOMY  2006   COLONOSCOPY WITH PROPOFOL N/A 10/30/2020   Procedure: COLONOSCOPY WITH PROPOFOL;  Surgeon: Lesly Rubenstein, MD;  Location: [facility] ENDOSCOPY;  Service: Endoscopy;  Laterality: N/A;   EXPLORATORY LAPAROTOMY  late 1990's   "blocked colon S/P hernia repair"   HERNIA REPAIR     "@ least 3 abdominal hernia repairs"   LUMBAR [place] ARTHROSCOPY WITH ROTATOR CUFF REPAIR AND SUBACROMIAL DECOMPRESSION Right 05/25/2019   Procedure: Right shoulder mini open rotator cuff repair, subacromial decompression;  Surgeon: Susa Day, MD;  [facility];  Service: Orthopedics;  Laterality: Right;  90 mins   VAGINAL HYSTERECTOMY  1981    Family History: Family History  Problem Relation Age of Onset   Arthritis Mother    Cancer Father        prostate   Hyperlipidemia Father    Alcohol abuse Father    Arthritis Father    Heart disease Father    Stroke Father    Hypertension Father    Diabetes Father    Cancer Sister        ovary   Arthritis Sister    Cancer Brother        thyroid   Arthritis Brother    Cancer Sister        breast  Arthritis Sister    Breast cancer Sister 19   Arthritis Maternal Grandmother    Arthritis Maternal Grandfather     Social History:  reports that she quit smoking about 46 years ago. Her smoking use included cigarettes. She has a 28.00 pack-year smoking history. She has never used smokeless tobacco. She reports that she does not drink alcohol and does not use drugs.  Physical Exam: BP (!) 145/79 (BP Location: Left Arm, Patient Position: Sitting, Cuff Size: Large)   Pulse 76   Ht '5\' 5"'  (1.651 m)   Wt 267 lb (121.1 kg)   BMI 44.43 kg/m    Constitutional:  Alert and oriented, No acute distress. Cardiovascular: No clubbing, cyanosis, or edema. Respiratory: Normal  respiratory effort, no increased work of breathing. GI: Abdomen is soft, nontender, nondistended, no abdominal masses   Laboratory Data: Reviewed, see HPI  Pertinent Imaging: I have personally viewed and interpreted the renal ultrasound as well as the CT abdomen and pelvis that shows some very subtle mild left hydronephrosis with a questionable UPJ obstruction, but this is very subtle and may just be physiologic in nature.  Assessment & Plan:   71 year old comorbid female with morbid obesity and BMI of 44, hypertension, CKD stage IIIb and questionable mild left-sided asymptomatic hydronephrosis.  There is no prior imaging to review, and unclear if this is physiologic versus new finding, and may be unrelated to her CKD.  We discussed options including a nuc med renal scan to evaluate function and drainage versus a repeat ultrasound in 3 to 4 months.  Urinalysis today benign with no microscopic hematuria.  Using shared decision making, she opted for a repeat renal ultrasound in 3 months.  Regarding the stress incontinence, we discussed behavioral strategies including weight loss and Kegel exercises/pelvic floor physical therapy.  She is not bothered enough to consider more invasive treatments at this time.  We also briefly reviewed pessary and stress incontinence procedures.  RTC 3 to 4 months with renal ultrasound prior   Nickolas Madrid, MD 06/18/2022  [address], [place], [place] [address] [phone]

## 2022-07-05 ENCOUNTER — Other Ambulatory Visit: Payer: Self-pay | Admitting: Family

## 2022-07-05 DIAGNOSIS — F411 Generalized anxiety disorder: Secondary | ICD-10-CM

## 2022-07-24 ENCOUNTER — Other Ambulatory Visit: Payer: Self-pay | Admitting: Family

## 2022-08-18 ENCOUNTER — Encounter: Payer: Self-pay | Admitting: *Deleted

## 2022-08-19 ENCOUNTER — Ambulatory Visit: Admission: RE | Admit: 2022-08-19 | Payer: Medicare Other | Source: Home / Self Care

## 2022-08-19 ENCOUNTER — Encounter: Admission: RE | Payer: Self-pay | Source: Home / Self Care

## 2022-08-19 ENCOUNTER — Encounter: Payer: Self-pay | Admitting: Anesthesiology

## 2022-08-19 SURGERY — COLONOSCOPY WITH PROPOFOL
Anesthesia: General

## 2022-08-22 ENCOUNTER — Ambulatory Visit
Admission: RE | Admit: 2022-08-22 | Discharge: 2022-08-22 | Disposition: A | Discharge status: Home / Self Care | Payer: Medicare Other | Source: Ambulatory Visit | Attending: Family | Admitting: Family

## 2022-08-22 DIAGNOSIS — Z780 Asymptomatic menopausal state: Secondary | ICD-10-CM | POA: Diagnosis not present

## 2022-08-22 DIAGNOSIS — Z0000 Encounter for general adult medical examination without abnormal findings: Secondary | ICD-10-CM

## 2022-09-04 DIAGNOSIS — M170 Bilateral primary osteoarthritis of knee: Secondary | ICD-10-CM | POA: Diagnosis not present

## 2022-09-04 DIAGNOSIS — M1711 Unilateral primary osteoarthritis, right knee: Secondary | ICD-10-CM | POA: Diagnosis not present

## 2022-09-10 DIAGNOSIS — Z961 Presence of intraocular lens: Secondary | ICD-10-CM | POA: Diagnosis not present

## 2022-09-11 DIAGNOSIS — M170 Bilateral primary osteoarthritis of knee: Secondary | ICD-10-CM | POA: Diagnosis not present

## 2022-09-18 DIAGNOSIS — M170 Bilateral primary osteoarthritis of knee: Secondary | ICD-10-CM | POA: Diagnosis not present

## 2022-09-22 DIAGNOSIS — I10 Essential (primary) hypertension: Secondary | ICD-10-CM | POA: Diagnosis not present

## 2022-09-22 DIAGNOSIS — N1330 Unspecified hydronephrosis: Secondary | ICD-10-CM | POA: Diagnosis not present

## 2022-09-22 DIAGNOSIS — N1832 Chronic kidney disease, stage 3b: Secondary | ICD-10-CM | POA: Diagnosis not present

## 2022-09-25 ENCOUNTER — Ambulatory Visit (INDEPENDENT_AMBULATORY_CARE_PROVIDER_SITE_OTHER): Payer: Medicare Other

## 2022-09-25 VITALS — Ht 65.0 in | Wt 267.0 lb

## 2022-09-25 DIAGNOSIS — Z0000 Encounter for general adult medical examination without abnormal findings: Secondary | ICD-10-CM | POA: Diagnosis not present

## 2022-09-25 NOTE — Progress Notes (Signed)
Subjective:   Julie Rush is a 71 y.o. female who presents for Medicare Annual (Subsequent) preventive examination.  Review of Systems    No ROS.  Medicare Wellness Virtual Visit.  Visual/audio telehealth visit, UTA vital signs.   See social history for additional risk factors.   Cardiac Risk Factors include: advanced age (>33mn, >>60women)     Objective:    Today's Vitals   09/25/22 0919  Weight: 267 lb (121.1 kg)  Height: '5\' 5"'$  (1.651 m)   Body mass index is 44.43 kg/m.     09/25/2022    9:19 AM 10/30/2020    9:24 AM 09/04/2020   10:02 AM 08/09/2019    1:46 PM 05/25/2019    6:40 AM 05/20/2019    9:28 AM 01/22/2019    8:34 PM  Advanced Directives  Does Patient Have a Medical Advance Directive? Yes Yes No No No No No  Type of AParamedicof [place] will Living will;Healthcare Power of Attorney       Does patient want to make changes to medical advance directive? No - Patient declined        Copy of [place] Chart? No - copy requested No - copy requested       Would patient like information on creating a medical advance directive?   No - Patient declined No - Patient declined No - Patient declined No - Patient declined No - Patient declined    Current Medications (verified) Outpatient Encounter Medications as of 09/25/2022  Medication Sig   acetaminophen (TYLENOL) 500 MG tablet Take 1,000 mg by mouth every 6 (six) hours as needed for moderate pain or headache.    albuterol (VENTOLIN HFA) 108 (90 Base) MCG/ACT inhaler INHALE 2 PUFFS BY MOUTH EVERY 6 HOURS AS NEEDED FOR WHEEZE OR SHORTNESS OF BREATH   b complex vitamins tablet Take 2 tablets by mouth daily.   buPROPion (WELLBUTRIN XL) 300 MG 24 hr tablet TAKE 1 TABLET (300 MG TOTAL) BY MOUTH EVERY MORNING.   Cholecalciferol (VITAMIN D3 PO) Take by mouth daily at 2 PM.   Coenzyme Q10 200 MG/GM POWD Take by mouth.   ipratropium (ATROVENT) 0.06 % nasal spray Place 2 sprays  into both nostrils 4 (four) times daily.   methocarbamol (ROBAXIN) 500 MG tablet TAKE 1 TABLET BY MOUTH EVERY 8 HOURS AS NEEDED FOR MUSCLE SPASM   metoprolol succinate (TOPROL-XL) 50 MG 24 hr tablet TAKE ONE TABLET WITH OR IMMEDIATELY FOLLOWING A MEAL.   omeprazole (PRILOSEC) 20 MG capsule Take 20 mg by mouth daily as needed (heartburn).    triamcinolone cream (KENALOG) 0.1 % Apply 1 application topically 2 (two) times daily as needed for itching.   No facility-administered encounter medications on file as of 09/25/2022.    Allergies (verified) Codeine, Prednisone, Sulfa antibiotics, and Trimethoprim   History: Past Medical History:  Diagnosis Date   Anginal pain ([place]    Arthritis    "knees, back, fingers, shoulders, neck"   Chronic lower back pain    Depression 06/08/12   "related to not getting definitive dx of what's wrong"   Exertional dyspnea    GERD (gastroesophageal reflux disease)    H/O hiatal hernia    Headache(784.0)    History of stomach ulcers 1980's   Hypertension    Migraines    4-5x/yr   Pneumonia    PONV (postoperative nausea and vomiting)    Seasonal asthma    Sleep apnea 2013   "  dx'd borderline"   Type II diabetes mellitus ([place]) 2013   "borderline"   Vertigo    last episode over 6 mos ago   Past Surgical History:  Procedure Laterality Date   APPENDECTOMY  1960's   BREAST BIOPSY Right    right   CATARACT EXTRACTION W/PHACO Left 11/01/2018   Procedure: CATARACT EXTRACTION PHACO AND INTRAOCULAR LENS PLACEMENT ([place]) LEFT;  Surgeon: Eulogio Bear, MD;  Location: [facility];  Service: Ophthalmology;  Laterality: Left;   CATARACT EXTRACTION W/PHACO Right 11/29/2018   Procedure: CATARACT EXTRACTION PHACO AND INTRAOCULAR LENS PLACEMENT ([facility]) RIGHT;  Surgeon: Eulogio Bear, MD;  Location: [facility];  Service: Ophthalmology;  Laterality: Right;  Diabetes - diet conrolled sleep apnea   CHOLECYSTECTOMY  2006   COLONOSCOPY WITH  PROPOFOL N/A 10/30/2020   Procedure: COLONOSCOPY WITH PROPOFOL;  Surgeon: Lesly Rubenstein, MD;  Location: [facility];  Service: Endoscopy;  Laterality: N/A;   EXPLORATORY LAPAROTOMY  late 1990's   "blocked colon S/P hernia repair"   HERNIA REPAIR     "@ least 3 abdominal hernia repairs"   LUMBAR [place] ARTHROSCOPY WITH ROTATOR CUFF REPAIR AND SUBACROMIAL DECOMPRESSION Right 05/25/2019   Procedure: Right shoulder mini open rotator cuff repair, subacromial decompression;  Surgeon: Susa Day, MD;  Location: [facility];  Service: Orthopedics;  Laterality: Right;  90 mins   VAGINAL HYSTERECTOMY  1981   Family History  Problem Relation Age of Onset   Arthritis Mother    Cancer Father        prostate   Hyperlipidemia Father    Alcohol abuse Father    Arthritis Father    Heart disease Father    Stroke Father    Hypertension Father    Diabetes Father    Cancer Sister        ovary   Arthritis Sister    Cancer Brother        thyroid   Arthritis Brother    Cancer Sister        breast   Arthritis Sister    Breast cancer Sister 37   Arthritis Maternal Grandmother    Arthritis Maternal Grandfather    Social History   Socioeconomic History   Marital status: Married    Spouse name: Not on file   Number of children: Not on file   Years of education: Not on file   Highest education level: Not on file  Occupational History   Not on file  Tobacco Use   Smoking status: Former    Packs/day: 2.00    Years: 14.00    Total pack years: 28.00    Types: Cigarettes    Quit date: 11/08/1975    Years since quitting: 46.9   Smokeless tobacco: Never  Vaping Use   Vaping Use: Never used  Substance and Sexual Activity   Alcohol use: No    Alcohol/week: 0.0 standard drinks of alcohol   Drug use: No   Sexual activity: Yes    Partners: Male    Comment: Husband  Other Topics Concern   Not on file  Social History Narrative   Retired.   Lives with husband     Grown children- 2 daughters    2 grandsons    Pets- goats and dogs    Right handed    Caffeine- 1 soda a day, decaff coffee and tea other times    Enjoys reading    Social Determinants of Health  Financial Resource Strain: Low Risk  (09/25/2022)   Overall Financial Resource Strain (CARDIA)    Difficulty of Paying Living Expenses: Not hard at all  Food Insecurity: No Food Insecurity (09/25/2022)   Hunger Vital Sign    Worried About Running Out of Food in the Last Year: Never true    Ran Out of Food in the Last Year: Never true  Transportation Needs: No Transportation Needs (09/25/2022)   PRAPARE - Hydrologist (Medical): No    Lack of Transportation (Non-Medical): No  Physical Activity: Not on file  Stress: No Stress Concern Present (09/25/2022)   [place]    Feeling of Stress : Not at all  Social Connections: Unknown (09/25/2022)   Social Connection and Isolation Panel [NHANES]    Frequency of Communication with Friends and Family: Not on file    Frequency of Social Gatherings with Friends and Family: Not on file    Attends Religious Services: Not on file    Active Member of Clubs or Organizations: Not on file    Attends Archivist Meetings: Not on file    Marital Status: Married    Tobacco Counseling Counseling given: Not Answered   Clinical Intake:  Pre-visit preparation completed: Yes        Diabetes: No  How often do you need to have someone help you when you read instructions, pamphlets, or other written materials from your doctor or pharmacy?: 1 - Never  Interpreter Needed?: No      Activities of Daily Living    09/25/2022    9:23 AM  In your present state of health, do you have any difficulty performing the following activities:  Hearing? 1  Vision? 0  Difficulty concentrating or making decisions? 0  Walking or climbing stairs? 1  Comment Cane in  use when ambulating  Dressing or bathing? 0  Doing errands, shopping? 0  Preparing Food and eating ? N  Using the Toilet? N  In the past six months, have you accidently leaked urine? Y  Comment Stress incontinence. Managed with daily liner.  Do you have problems with loss of bowel control? N  Managing your Medications? N  Managing your Finances? N  Housekeeping or managing your Housekeeping? N    Patient Care Team: Burnard Hawthorne, FNP as PCP - General (Family Medicine)  Indicate any recent Medical Services you may have received from other than [facility] providers in the past year (date may be approximate).     Assessment:   This is a routine wellness examination for Julie Rush.  I connected with  Linna Hoff on 09/25/22 by a audio enabled telemedicine application and verified that I am speaking with the correct person using two identifiers.  Patient Location: Home  Provider Location: Office/Clinic  I discussed the limitations of evaluation and management by telemedicine. The patient expressed understanding and agreed to proceed.   Hearing/Vision screen Hearing Screening - Comments:: R ear hearing impairment. She chooses not to wear a hearing aid at this time. Followed by ENT.  Vision Screening - Comments:: Followed by [facility] Wears corrective lenses Cataract extraction, bilateral  They have seen their ophthalmologist in the last 12 months.   Dietary issues and exercise activities discussed: Current Exercise Habits: Home exercise routine, Type of exercise: stretching (Chair exercises), Frequency (Times/Week): 7, Intensity: Mild   Goals Addressed  This Visit's Progress     Patient Stated     Continue to lose weight (pt-stated)   On track     Portion control meals        Depression Screen    09/25/2022    9:22 AM 04/30/2022    9:02 AM 03/03/2022   10:00 AM 09/04/2020    9:48 AM 08/09/2019    1:41 PM 11/20/2016    2:55 PM 04/02/2015    10:41 AM  PHQ 2/9 Scores  PHQ - 2 Score 0 0 0 0 0 0 0  PHQ- 9 Score  0 0        Fall Risk    09/25/2022    9:22 AM 04/30/2022    9:02 AM 03/03/2022    9:54 AM 09/04/2020    9:48 AM 08/09/2019    1:41 PM  Fall Risk   Falls in the past year? 0 0 0 0 1  Number falls in past yr: 0 0 0 0 0  Injury with Fall? 0 0 0  1  Comment     Followed by ortho and pcp. Physical therapy currently in session.  Risk for fall due to :  No Fall Risks No Fall Risks    Follow up Falls evaluation completed Falls evaluation completed Falls evaluation completed Falls evaluation completed Falls prevention discussed;Education provided    [place]: Home free of loose throw rugs in walkways, pet beds, electrical cords, etc? Yes  Adequate lighting in your home to reduce risk of falls? Yes   ASSISTIVE DEVICES UTILIZED TO PREVENT FALLS: Life alert? No  Use of a cane, walker or w/c? Yes  Grab bars in the bathroom? Yes  Shower chair or bench in shower? Yes  Comfort chair height toilet? Yes   TIMED UP AND GO: Was the test performed? No .    Cognitive Function:        09/25/2022    9:26 AM 08/09/2019    1:46 PM  6CIT Screen  What Year? 0 points 0 points  What month? 0 points 0 points  What time? 0 points 0 points  Count back from 20 0 points 0 points  Months in reverse 0 points 0 points  Repeat phrase 0 points 0 points  Total Score 0 points 0 points    Immunizations Immunization History  Administered Date(s) Administered   Janssen (J&J) SARS-COV-2 Vaccination 09/05/2020   Pneumococcal Conjugate-13 08/31/2014, 11/02/2017   Pneumococcal Polysaccharide-23 01/21/2019   Pneumococcal-Unspecified 09/21/2014   Tdap 08/31/2014, 09/21/2014   Flu vaccine discontinued per patient.   Screening Tests Health Maintenance  Topic Date Due   COVID-19 Vaccine (2 - Booster for Janssen series) 03/03/2023 (Originally 10/31/2020)   Zoster Vaccines- Shingrix (1 of 2) 06/03/2023  (Originally 08/02/1999)   MAMMOGRAM  05/05/2024   TETANUS/TDAP  09/21/2024   COLONOSCOPY (Pts 45-49yr Insurance coverage will need to be confirmed)  10/30/2025   Pneumonia Vaccine 71 Years old  Completed   DEXA SCAN  Completed   Hepatitis C Screening  Completed   HPV VACCINES  Aged Out   INFLUENZA VACCINE  Discontinued    Health Maintenance  There are no preventive care reminders to display for this patient.  Lung Cancer Screening: (Low Dose CT Chest recommended if Age 73-80years, 30 pack-year currently smoking OR have quit w/in 15years.) does not qualify.   Vision Screening: Recommended annual ophthalmology exams for early detection of glaucoma and other disorders of the eye.  Dental Screening: Recommended annual dental exams for proper oral hygiene  Community Resource Referral / Chronic Care Management: CRR required this visit?  No   CCM required this visit?  No      Plan:     I have personally reviewed and noted the following in the patient's chart:   Medical and social history Use of alcohol, tobacco or illicit drugs  Current medications and supplements including opioid prescriptions. Patient is not currently taking opioid prescriptions. Functional ability and status Nutritional status Physical activity Advanced directives List of other physicians Hospitalizations, surgeries, and ER visits in previous 12 months Vitals Screenings to include cognitive, depression, and falls Referrals and appointments  In addition, I have reviewed and discussed with patient certain preventive protocols, quality metrics, and best practice recommendations. A written personalized care plan for preventive services as well as general preventive health recommendations were provided to patient.     Varney Biles, LPN   [id number]

## 2022-09-25 NOTE — Patient Instructions (Addendum)
Julie Rush , Thank you for taking time to come for your Medicare Wellness Visit. I appreciate your ongoing commitment to your health goals. Please review the following plan we discussed and let me know if I can assist you in the future.   These are the goals we discussed:  Goals       Patient Stated     Continue to lose weight (pt-stated)      Portion control meals         This is a list of the screening recommended for you and due dates:  Health Maintenance  Topic Date Due   COVID-19 Vaccine (2 - Booster for [employer] series) 03/03/2023*   Zoster (Shingles) Vaccine (1 of 2) 06/03/2023*   Mammogram  05/05/2024   Tetanus Vaccine  09/21/2024   Colon Cancer Screening  10/30/2025   Pneumonia Vaccine  Completed   DEXA scan (bone density measurement)  Completed   Hepatitis C Screening: USPSTF Recommendation to screen - Ages 64-79 yo.  Completed   HPV Vaccine  Aged Out   Flu Shot  Discontinued  *Topic was postponed. The date shown is not the original due date.    Advanced directives: End of life planning; Advance aging; Advanced directives discussed.  Copy of current HCPOA/Living Will requested.    Conditions/risks identified: none new  Next appointment: Follow up in one year for your annual wellness visit    Preventive Care 65 Years and Older, Female Preventive care refers to lifestyle choices and visits with your health care provider that can promote health and wellness. What does preventive care include? A yearly physical exam. This is also called an annual well check. Dental exams once or twice a year. Routine eye exams. Ask your health care provider how often you should have your eyes checked. Personal lifestyle choices, including: Daily care of your teeth and gums. Regular physical activity. Eating a healthy diet. Avoiding tobacco and drug use. Limiting alcohol use. Practicing safe sex. Taking low-dose aspirin every day. Taking vitamin and mineral supplements as  recommended by your health care provider. What happens during an annual well check? The services and screenings done by your health care provider during your annual well check will depend on your age, overall health, lifestyle risk factors, and family history of disease. Counseling  Your health care provider may ask you questions about your: Alcohol use. Tobacco use. Drug use. Emotional well-being. Home and relationship well-being. Sexual activity. Eating habits. History of falls. Memory and ability to understand (cognition). Work and work Statistician. Reproductive health. Screening  You may have the following tests or measurements: Height, weight, and BMI. Blood pressure. Lipid and cholesterol levels. These may be checked every 5 years, or more frequently if you are over 81 years old. Skin check. Lung cancer screening. You may have this screening every year starting at age 75 if you have a 30-pack-year history of smoking and currently smoke or have quit within the past 15 years. Fecal occult blood test (FOBT) of the stool. You may have this test every year starting at age 69. Flexible sigmoidoscopy or colonoscopy. You may have a sigmoidoscopy every 5 years or a colonoscopy every 10 years starting at age 40. Hepatitis C blood test. Hepatitis B blood test. Sexually transmitted disease (STD) testing. Diabetes screening. This is done by checking your blood sugar (glucose) after you have not eaten for a while (fasting). You may have this done every 1-3 years. Bone density scan. This is done to screen for  osteoporosis. You may have this done starting at age 33. Mammogram. This may be done every 1-2 years. Talk to your health care provider about how often you should have regular mammograms. Talk with your health care provider about your test results, treatment options, and if necessary, the need for more tests. Vaccines  Your health care provider may recommend certain vaccines, such  as: Influenza vaccine. This is recommended every year. Tetanus, diphtheria, and acellular pertussis (Tdap, Td) vaccine. You may need a Td booster every 10 years. Zoster vaccine. You may need this after age 54. Pneumococcal 13-valent conjugate (PCV13) vaccine. One dose is recommended after age 76. Pneumococcal polysaccharide (PPSV23) vaccine. One dose is recommended after age 68. Talk to your health care provider about which screenings and vaccines you need and how often you need them. This information is not intended to replace advice given to you by your health care provider. Make sure you discuss any questions you have with your health care provider. Document Released: 01/04/2016 Document Revised: 08/27/2016 Document Reviewed: 10/09/2015 Elsevier Interactive Patient Education  2017 [employer] Prevention in the Home Falls can cause injuries. They can happen to people of all ages. There are many things you can do to make your home safe and to help prevent falls. What can I do on the outside of my home? Regularly fix the edges of walkways and driveways and fix any cracks. Remove anything that might make you trip as you walk through a door, such as a raised step or threshold. Trim any bushes or trees on the path to your home. Use bright outdoor lighting. Clear any walking paths of anything that might make someone trip, such as rocks or tools. Regularly check to see if handrails are loose or broken. Make sure that both sides of any steps have handrails. Any raised decks and porches should have guardrails on the edges. Have any leaves, snow, or ice cleared regularly. Use sand or salt on walking paths during winter. Clean up any spills in your garage right away. This includes oil or grease spills. What can I do in the bathroom? Use night lights. Install grab bars by the toilet and in the tub and shower. Do not use towel bars as grab bars. Use non-skid mats or decals in the tub or  shower. If you need to sit down in the shower, use a plastic, non-slip stool. Keep the floor dry. Clean up any water that spills on the floor as soon as it happens. Remove soap buildup in the tub or shower regularly. Attach bath mats securely with double-sided non-slip rug tape. Do not have throw rugs and other things on the floor that can make you trip. What can I do in the bedroom? Use night lights. Make sure that you have a light by your bed that is easy to reach. Do not use any sheets or blankets that are too big for your bed. They should not hang down onto the floor. Have a firm chair that has side arms. You can use this for support while you get dressed. Do not have throw rugs and other things on the floor that can make you trip. What can I do in the kitchen? Clean up any spills right away. Avoid walking on wet floors. Keep items that you use a lot in easy-to-reach places. If you need to reach something above you, use a strong step stool that has a grab bar. Keep electrical cords out of the way. Do not  use floor polish or wax that makes floors slippery. If you must use wax, use non-skid floor wax. Do not have throw rugs and other things on the floor that can make you trip. What can I do with my stairs? Do not leave any items on the stairs. Make sure that there are handrails on both sides of the stairs and use them. Fix handrails that are broken or loose. Make sure that handrails are as long as the stairways. Check any carpeting to make sure that it is firmly attached to the stairs. Fix any carpet that is loose or worn. Avoid having throw rugs at the top or bottom of the stairs. If you do have throw rugs, attach them to the floor with carpet tape. Make sure that you have a light switch at the top of the stairs and the bottom of the stairs. If you do not have them, ask someone to add them for you. What else can I do to help prevent falls? Wear shoes that: Do not have high heels. Have  rubber bottoms. Are comfortable and fit you well. Are closed at the toe. Do not wear sandals. If you use a stepladder: Make sure that it is fully opened. Do not climb a closed stepladder. Make sure that both sides of the stepladder are locked into place. Ask someone to hold it for you, if possible. Clearly mark and make sure that you can see: Any grab bars or handrails. First and last steps. Where the edge of each step is. Use tools that help you move around (mobility aids) if they are needed. These include: Canes. Walkers. Scooters. Crutches. Turn on the lights when you go into a dark area. Replace any light bulbs as soon as they burn out. Set up your furniture so you have a clear path. Avoid moving your furniture around. If any of your floors are uneven, fix them. If there are any pets around you, be aware of where they are. Review your medicines with your doctor. Some medicines can make you feel dizzy. This can increase your chance of falling. Ask your doctor what other things that you can do to help prevent falls. This information is not intended to replace advice given to you by your health care provider. Make sure you discuss any questions you have with your health care provider. Document Released: 10/04/2009 Document Revised: 05/15/2016 Document Reviewed: 01/12/2015 Elsevier Interactive Patient Education  2017 [employer].

## 2022-10-07 ENCOUNTER — Ambulatory Visit: Payer: Medicare Other

## 2022-10-07 ENCOUNTER — Ambulatory Visit
Admission: RE | Admit: 2022-10-07 | Discharge: 2022-10-07 | Disposition: A | Discharge status: Home / Self Care | Payer: Medicare Other | Source: Ambulatory Visit | Attending: Urology | Admitting: Urology

## 2022-10-07 DIAGNOSIS — N1330 Unspecified hydronephrosis: Secondary | ICD-10-CM | POA: Diagnosis not present

## 2022-10-21 ENCOUNTER — Ambulatory Visit (INDEPENDENT_AMBULATORY_CARE_PROVIDER_SITE_OTHER): Payer: Medicare Other | Admitting: Urology

## 2022-10-21 ENCOUNTER — Encounter: Payer: Self-pay | Admitting: Urology

## 2022-10-21 VITALS — BP 133/83 | HR 76 | Ht 63.0 in | Wt 267.0 lb

## 2022-10-21 DIAGNOSIS — N1831 Chronic kidney disease, stage 3a: Secondary | ICD-10-CM

## 2022-10-21 DIAGNOSIS — N130 Hydronephrosis with ureteropelvic junction obstruction: Secondary | ICD-10-CM | POA: Diagnosis not present

## 2022-10-21 DIAGNOSIS — Q6211 Congenital occlusion of ureteropelvic junction: Secondary | ICD-10-CM

## 2022-10-21 NOTE — Progress Notes (Signed)
   10/21/2022 10:38 AM   Linna Hoff 16-Dec-1951 [id number]  Reason for visit: Follow up mild left hydronephrosis, CKD IIIa, stress incontinence  HPI: She is a 71 year old comorbid female with morbid obesity BMI 47, hypertension, CKD IIIa  referred for mild left hydronephrosis.  This was originally found on a renal ultrasound that was ordered by nephrology, and confirmed on a noncontrast CT with some very subtle left hydronephrosis with questionable UPJ obstruction.  She has not had any flank pain, gross hematuria, or UTIs.  Using shared decision making, she opted for a repeat renal ultrasound and 4 months.  I personally viewed and interpreted the most recent renal ultrasound dated 10/07/2022 that shows stable mild left hydronephrosis.  Renal function is actually improved from her last visit, with recent eGFR 52 from 41 originally.  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  We discussed options including retrograde pyelogram or diagnostic ureteroscopy, versus repeat ultrasound in 1 year.  She opted for a repeat ultrasound in 1 year which is very reasonable, return precautions were discussed including worsening kidney function or recurrent infections, or left-sided flank pain.  RTC 1 year with renal ultrasound prior   Billey Co, MD  [facility] [address], [place], [place] [address] [phone]

## 2022-11-11 ENCOUNTER — Ambulatory Visit: Admission: RE | Admit: 2022-11-11 | Payer: Medicare Other | Source: Ambulatory Visit

## 2022-11-11 ENCOUNTER — Encounter: Admission: RE | Payer: Self-pay | Source: Ambulatory Visit

## 2022-11-11 SURGERY — COLONOSCOPY WITH PROPOFOL
Anesthesia: General

## 2022-11-19 DIAGNOSIS — M9901 Segmental and somatic dysfunction of cervical region: Secondary | ICD-10-CM | POA: Diagnosis not present

## 2022-11-19 DIAGNOSIS — M9902 Segmental and somatic dysfunction of thoracic region: Secondary | ICD-10-CM | POA: Diagnosis not present

## 2022-11-19 DIAGNOSIS — M9903 Segmental and somatic dysfunction of lumbar region: Secondary | ICD-10-CM | POA: Diagnosis not present

## 2022-11-19 DIAGNOSIS — M9906 Segmental and somatic dysfunction of lower extremity: Secondary | ICD-10-CM | POA: Diagnosis not present

## 2022-11-20 DIAGNOSIS — M9901 Segmental and somatic dysfunction of cervical region: Secondary | ICD-10-CM | POA: Diagnosis not present

## 2022-11-20 DIAGNOSIS — M9903 Segmental and somatic dysfunction of lumbar region: Secondary | ICD-10-CM | POA: Diagnosis not present

## 2022-11-20 DIAGNOSIS — M9902 Segmental and somatic dysfunction of thoracic region: Secondary | ICD-10-CM | POA: Diagnosis not present

## 2022-11-20 DIAGNOSIS — M9906 Segmental and somatic dysfunction of lower extremity: Secondary | ICD-10-CM | POA: Diagnosis not present

## 2022-11-21 DIAGNOSIS — M9901 Segmental and somatic dysfunction of cervical region: Secondary | ICD-10-CM | POA: Diagnosis not present

## 2022-11-21 DIAGNOSIS — M9902 Segmental and somatic dysfunction of thoracic region: Secondary | ICD-10-CM | POA: Diagnosis not present

## 2022-11-21 DIAGNOSIS — M9906 Segmental and somatic dysfunction of lower extremity: Secondary | ICD-10-CM | POA: Diagnosis not present

## 2022-11-21 DIAGNOSIS — M9903 Segmental and somatic dysfunction of lumbar region: Secondary | ICD-10-CM | POA: Diagnosis not present

## 2022-11-24 DIAGNOSIS — M9901 Segmental and somatic dysfunction of cervical region: Secondary | ICD-10-CM | POA: Diagnosis not present

## 2022-11-24 DIAGNOSIS — M9903 Segmental and somatic dysfunction of lumbar region: Secondary | ICD-10-CM | POA: Diagnosis not present

## 2022-11-24 DIAGNOSIS — M9906 Segmental and somatic dysfunction of lower extremity: Secondary | ICD-10-CM | POA: Diagnosis not present

## 2022-11-24 DIAGNOSIS — M9902 Segmental and somatic dysfunction of thoracic region: Secondary | ICD-10-CM | POA: Diagnosis not present

## 2022-11-25 DIAGNOSIS — M9901 Segmental and somatic dysfunction of cervical region: Secondary | ICD-10-CM | POA: Diagnosis not present

## 2022-11-25 DIAGNOSIS — M9906 Segmental and somatic dysfunction of lower extremity: Secondary | ICD-10-CM | POA: Diagnosis not present

## 2022-11-25 DIAGNOSIS — M9903 Segmental and somatic dysfunction of lumbar region: Secondary | ICD-10-CM | POA: Diagnosis not present

## 2022-11-25 DIAGNOSIS — M9902 Segmental and somatic dysfunction of thoracic region: Secondary | ICD-10-CM | POA: Diagnosis not present

## 2022-11-27 DIAGNOSIS — M9906 Segmental and somatic dysfunction of lower extremity: Secondary | ICD-10-CM | POA: Diagnosis not present

## 2022-11-27 DIAGNOSIS — M9902 Segmental and somatic dysfunction of thoracic region: Secondary | ICD-10-CM | POA: Diagnosis not present

## 2022-11-27 DIAGNOSIS — M9901 Segmental and somatic dysfunction of cervical region: Secondary | ICD-10-CM | POA: Diagnosis not present

## 2022-11-27 DIAGNOSIS — M9903 Segmental and somatic dysfunction of lumbar region: Secondary | ICD-10-CM | POA: Diagnosis not present

## 2022-12-01 DIAGNOSIS — M9906 Segmental and somatic dysfunction of lower extremity: Secondary | ICD-10-CM | POA: Diagnosis not present

## 2022-12-01 DIAGNOSIS — M9901 Segmental and somatic dysfunction of cervical region: Secondary | ICD-10-CM | POA: Diagnosis not present

## 2022-12-01 DIAGNOSIS — M9903 Segmental and somatic dysfunction of lumbar region: Secondary | ICD-10-CM | POA: Diagnosis not present

## 2022-12-01 DIAGNOSIS — M9902 Segmental and somatic dysfunction of thoracic region: Secondary | ICD-10-CM | POA: Diagnosis not present

## 2022-12-03 DIAGNOSIS — M9906 Segmental and somatic dysfunction of lower extremity: Secondary | ICD-10-CM | POA: Diagnosis not present

## 2022-12-03 DIAGNOSIS — M9903 Segmental and somatic dysfunction of lumbar region: Secondary | ICD-10-CM | POA: Diagnosis not present

## 2022-12-03 DIAGNOSIS — M9901 Segmental and somatic dysfunction of cervical region: Secondary | ICD-10-CM | POA: Diagnosis not present

## 2022-12-03 DIAGNOSIS — M9902 Segmental and somatic dysfunction of thoracic region: Secondary | ICD-10-CM | POA: Diagnosis not present

## 2022-12-08 ENCOUNTER — Ambulatory Visit (INDEPENDENT_AMBULATORY_CARE_PROVIDER_SITE_OTHER): Payer: Medicare Other

## 2022-12-08 ENCOUNTER — Ambulatory Visit
Admission: RE | Admit: 2022-12-08 | Discharge: 2022-12-08 | Disposition: A | Discharge status: Home / Self Care | Payer: Medicare Other | Source: Ambulatory Visit | Attending: Nurse Practitioner | Admitting: Nurse Practitioner

## 2022-12-08 VITALS — BP 137/82 | HR 77 | Temp 98.1°F | Resp 16

## 2022-12-08 DIAGNOSIS — R062 Wheezing: Secondary | ICD-10-CM | POA: Diagnosis not present

## 2022-12-08 DIAGNOSIS — R059 Cough, unspecified: Secondary | ICD-10-CM

## 2022-12-08 DIAGNOSIS — R0602 Shortness of breath: Secondary | ICD-10-CM | POA: Diagnosis not present

## 2022-12-08 DIAGNOSIS — J069 Acute upper respiratory infection, unspecified: Secondary | ICD-10-CM | POA: Diagnosis not present

## 2022-12-08 DIAGNOSIS — Z1152 Encounter for screening for COVID-19: Secondary | ICD-10-CM | POA: Insufficient documentation

## 2022-12-08 LAB — RESP PANEL BY RT-PCR (FLU A&B, COVID) ARPGX2
Influenza A by PCR: NEGATIVE
Influenza B by PCR: NEGATIVE
SARS Coronavirus 2 by RT PCR: NEGATIVE

## 2022-12-08 MED ORDER — ALBUTEROL SULFATE (2.5 MG/3ML) 0.083% IN NEBU
2.5000 mg | INHALATION_SOLUTION | Freq: Once | RESPIRATORY_TRACT | Status: AC
  Administered 2022-12-08: 2.5 mg via RESPIRATORY_TRACT

## 2022-12-08 MED ORDER — BENZONATATE 100 MG PO CAPS: 100.0000 mg | ORAL_CAPSULE | Freq: Three times a day (TID) | ORAL | 0 refills | Status: DC | PRN

## 2022-12-08 NOTE — Discharge Instructions (Signed)
The chest x-ray today looks good - no signs of pneumonia.    We gave you a breathing treatment today with helped open up your chest quite a bit.  Please continue to use albuterol inhaler at home every 4-6 hours as needed for wheezing or shortness of breath.  Recommend following up with your primary care provider within a week or so to recheck your symptoms and make sure you are feeling better.  You have a viral upper respiratory infection.  Symptoms should improve over the next week to 10 days.  If you develop chest pain or shortness of breath, go to the emergency room.  We have tested you today for COVID-19 and influenza.  You will see the results in Mychart and we will call you with positive results.    Please stay home and isolate until you are aware of the results.    Some things that can make you feel better are: - Increased rest - Increasing fluid with water/sugar free electrolytes - Acetaminophen and ibuprofen as needed for fever/pain - Salt water gargling, chloraseptic spray and throat lozenges - OTC guaifenesin (Mucinex) 600 mg twice daily - Saline sinus flushes or a neti pot - Humidifying the air -Tessalon Perles as needed for dry cough.

## 2022-12-08 NOTE — ED Triage Notes (Signed)
Headache, cough, wheezing, sore throat, body aches that started Thursday. Taking tylenol and Nyquil.

## 2022-12-08 NOTE — ED Provider Notes (Signed)
[facility]    CSN: [id number] Arrival date & time: 12/08/22  1142      History   Chief Complaint Chief Complaint  Patient presents with   Wheezing    Coughing, wheezing, aching, sore throat, headache - Entered by patient   Cough   Headache   Sore Throat   Generalized Body Aches    HPI Julie Rush is a 71 y.o. female.   Patient presents for 4 days of low-grade fevers, body aches, chills, congested and productive cough, shortness of breath with activity and after coughing, wheezing that is worse at nighttime when she lays down, pain in her chest after coughing, chest tightness that is worse at night, chest and nasal congestion, runny nose, sore throat, headache, right ear pain without drainage, nausea without vomiting, decreased appetite, and fatigue.  Patient denies shortness of breath at rest, abdominal pain, vomiting, diarrhea, or loss of taste/smell.  Has not tested for COVID-19 since symptoms started.  Has been trying to push fluids, take NyQuil and Tylenol for symptoms without much benefit.  Patient denies history of chronic lung disease.  Reports she has a rescue inhaler that she uses seasonally.  Has not used rescue inhaler since symptoms started.    Past Medical History:  Diagnosis Date   Anginal pain ([place])    Arthritis    "knees, back, fingers, shoulders, neck"   Chronic lower back pain    Depression 06/08/12   "related to not getting definitive dx of what's wrong"   Exertional dyspnea    GERD (gastroesophageal reflux disease)    H/O hiatal hernia    Headache(784.0)    History of stomach ulcers 1980's   Hypertension    Migraines    4-5x/yr   Pneumonia    PONV (postoperative nausea and vomiting)    Seasonal asthma    Sleep apnea 2013   "dx'd borderline"   Type II diabetes mellitus ([place]) 2013   "borderline"   Vertigo    last episode over 6 mos ago    Patient Active Problem List   Diagnosis Date Noted   Anemia 04/30/2022   Dysuria  01/18/2021   Bilateral lower abdominal pain 10/02/2020   Constipation 10/02/2020   History of rectal bleeding 10/02/2020   Complete rotator cuff tear 05/25/2019   Pre-operative clearance 02/11/2019   HLD (hyperlipidemia) 02/11/2019   Osteoarthritis of right knee 10/22/2018   Breast mass, right 11/03/2017   Routine physical examination 01/12/2017   Chest pain 01/12/2017   Muscle cramp 11/20/2016   Acute bronchitis 11/20/2016   Arthritis 04/08/2015   Generalized anxiety disorder 04/08/2015   GERD (gastroesophageal reflux disease) 04/08/2015   Other dyspnea and respiratory abnormality 06/08/2012   Essential hypertension, benign 06/08/2012   Hyperlipidemia 06/08/2012   Severe obesity (BMI >= 40) ([place]) 06/08/2012   Morbid obesity ([place]) 06/08/2012    Past Surgical History:  Procedure Laterality Date   APPENDECTOMY  1960's   BREAST BIOPSY Right    right   CATARACT EXTRACTION W/PHACO Left 11/01/2018   Procedure: CATARACT EXTRACTION PHACO AND INTRAOCULAR LENS PLACEMENT ([place]) LEFT;  Surgeon: Eulogio Bear, MD;  Location: [place];  Service: Ophthalmology;  Laterality: Left;   CATARACT EXTRACTION W/PHACO Right 11/29/2018   Procedure: CATARACT EXTRACTION PHACO AND INTRAOCULAR LENS PLACEMENT ([facility]) RIGHT;  Surgeon: Eulogio Bear, MD;  Location: [place];  Service: Ophthalmology;  Laterality: Right;  Diabetes - diet conrolled sleep apnea   CHOLECYSTECTOMY  2006   COLONOSCOPY  WITH PROPOFOL N/A 10/30/2020   Procedure: COLONOSCOPY WITH PROPOFOL;  Surgeon: Lesly Rubenstein, MD;  Location: [facility];  Service: Endoscopy;  Laterality: N/A;   EXPLORATORY LAPAROTOMY  late 1990's   "blocked colon S/P hernia repair"   HERNIA REPAIR     "@ least 3 abdominal hernia repairs"   LUMBAR [place] ARTHROSCOPY WITH ROTATOR CUFF REPAIR AND SUBACROMIAL DECOMPRESSION Right 05/25/2019   Procedure: Right shoulder mini open rotator cuff repair, subacromial  decompression;  Surgeon: Susa Day, MD;  Location: [facility];  Service: Orthopedics;  Laterality: Right;  90 mins   VAGINAL HYSTERECTOMY  1981    OB History   No obstetric history on file.      Home Medications    Prior to Admission medications   Medication Sig Start Date End Date Taking? Authorizing Provider  acetaminophen (TYLENOL) 500 MG tablet Take 1,000 mg by mouth every 6 (six) hours as needed for moderate pain or headache.    Yes [provider]  albuterol (VENTOLIN HFA) 108 (90 Base) MCG/ACT inhaler INHALE 2 PUFFS BY MOUTH EVERY 6 HOURS AS NEEDED FOR WHEEZE OR SHORTNESS OF BREATH 02/24/22  Yes Arnett, Yvetta Coder, FNP  b complex vitamins tablet Take 2 tablets by mouth daily.   Yes [provider]  buPROPion (WELLBUTRIN XL) 300 MG 24 hr tablet TAKE 1 TABLET (300 MG TOTAL) BY MOUTH EVERY MORNING. 07/06/22  Yes Dutch Quint B, FNP  Cholecalciferol (VITAMIN D3 PO) Take by mouth daily at 2 PM.   Yes [provider]  ipratropium (ATROVENT) 0.06 % nasal spray Place 2 sprays into both nostrils 4 (four) times daily. 05/29/21  Yes Margarette Canada, NP  metoprolol succinate (TOPROL-XL) 50 MG 24 hr tablet TAKE ONE TABLET WITH OR IMMEDIATELY FOLLOWING A MEAL. 05/12/22  Yes Arnett, Yvetta Coder, FNP  benzonatate (TESSALON) 100 MG capsule Take 1 capsule (100 mg total) by mouth 3 (three) times daily as needed for cough. Do not take with alcohol or while driving or operating heavy machinery.  May cause drowsiness. 12/08/22  Yes Noemi Chapel A, NP  Coenzyme Q10 200 MG/GM POWD Take by mouth.    [provider]  methocarbamol (ROBAXIN) 500 MG tablet TAKE 1 TABLET BY MOUTH EVERY 8 HOURS AS NEEDED FOR MUSCLE SPASM 07/25/22   Burnard Hawthorne, FNP  omeprazole (PRILOSEC) 20 MG capsule Take 20 mg by mouth daily as needed (heartburn).     [provider]  triamcinolone cream (KENALOG) 0.1 % Apply 1 application topically 2 (two) times daily as needed for itching. 02/14/19    [provider]    Family History Family History  Problem Relation Age of Onset   Arthritis Mother    Cancer Father        prostate   Hyperlipidemia Father    Alcohol abuse Father    Arthritis Father    Heart disease Father    Stroke Father    Hypertension Father    Diabetes Father    Cancer Sister        ovary   Arthritis Sister    Cancer Brother        thyroid   Arthritis Brother    Cancer Sister        breast   Arthritis Sister    Breast cancer Sister 46   Arthritis Maternal Grandmother    Arthritis Maternal Grandfather     Social History Social History   Tobacco Use  Smoking status: Former    Packs/day: 2.00    Years: 14.00    Total pack years: 28.00    Types: Cigarettes    Quit date: 11/08/1975    Years since quitting: 47.1    Passive exposure: Past   Smokeless tobacco: Never  Vaping Use   Vaping Use: Never used  Substance Use Topics   Alcohol use: No    Alcohol/week: 0.0 standard drinks of alcohol   Drug use: No     Allergies   Codeine, Prednisone, Sulfa antibiotics, and Trimethoprim   Review of Systems Review of Systems Per HPI  Physical Exam Triage Vital Signs ED Triage Vitals [12/08/22 1217]  Enc Vitals Group     BP 137/82     Pulse Rate 77     Resp 16     Temp 98.1 F (36.7 C)     Temp Source Oral     SpO2 94 %     Weight      Height      Head Circumference      Peak Flow      Pain Score 7     Pain Loc      Pain Edu?      Excl. in [place]?    No data found.  Updated Vital Signs BP 137/82 (BP Location: Right Arm)   Pulse 77   Temp 98.1 F (36.7 C) (Oral)   Resp 16   SpO2 94%   Visual Acuity Right Eye Distance:   Left Eye Distance:   Bilateral Distance:    Right Eye Near:   Left Eye Near:    Bilateral Near:     Physical Exam Vitals and nursing note reviewed.  Constitutional:      General: She is not in acute distress.    Appearance: Normal appearance. She is not ill-appearing or toxic-appearing.   HENT:     Head: Normocephalic and atraumatic.     Right Ear: Tympanic membrane, ear canal and external ear normal.     Left Ear: Tympanic membrane, ear canal and external ear normal.     Nose: Congestion present. No rhinorrhea.     Mouth/Throat:     Mouth: Mucous membranes are moist.     Pharynx: Oropharynx is clear. Posterior oropharyngeal erythema present. No oropharyngeal exudate.  Eyes:     General: No scleral icterus.    Extraocular Movements: Extraocular movements intact.  Cardiovascular:     Rate and Rhythm: Normal rate and regular rhythm.  Pulmonary:     Effort: Pulmonary effort is normal. No respiratory distress.     Breath sounds: Decreased breath sounds present. No wheezing, rhonchi or rales.  Abdominal:     General: Abdomen is flat. Bowel sounds are normal. There is no distension.     Palpations: Abdomen is soft.     Tenderness: There is no abdominal tenderness.  Musculoskeletal:     Cervical back: Normal range of motion and neck supple.  Lymphadenopathy:     Cervical: No cervical adenopathy.  Skin:    General: Skin is warm and dry.     Coloration: Skin is not jaundiced or pale.     Findings: No erythema or rash.  Neurological:     Mental Status: She is alert and oriented to person, place, and time.  Psychiatric:        Behavior: Behavior is cooperative.      UC Treatments / Results  Labs (all labs ordered are listed, but only abnormal  results are displayed) Labs Reviewed  RESP PANEL BY RT-PCR (FLU A&B, COVID) [id number]    EKG   Radiology DG Chest 2 View  Result Date: 12/08/2022 CLINICAL DATA:  Cough, shortness of breath. EXAM: CHEST - 2 VIEW COMPARISON:  June 08, 2012. FINDINGS: The heart size and mediastinal contours are within normal limits. Both lungs are clear. The visualized skeletal structures are unremarkable. IMPRESSION: No active cardiopulmonary disease. Electronically Signed   By: Marijo Conception M.D.   On: 12/08/2022 13:27     Procedures Procedures (including critical care time)  Medications Ordered in UC Medications  albuterol (PROVENTIL) (2.5 MG/3ML) 0.083% nebulizer solution 2.5 mg (2.5 mg Nebulization Given 12/08/22 1238)    Initial Impression / Assessment and Plan / UC Course  I have reviewed the triage vital signs and the nursing notes.  Pertinent labs & imaging results that were available during my care of the patient were reviewed by me and considered in my medical decision making (see chart for details).   Patient is well-appearing, normotensive, afebrile, not tachycardic, not tachypneic, oxygenating well on room air.    Encounter for screening for COVID-19 Viral URI with cough Wheezing Suspect viral etiology COVID-19, influenza testing obtained Patient is a candidate for Tamiflu or renally dosed Paxlovid if she test positive Albuterol breathing treatment given which helped improved bilateral air movement, rhonchi versus wheezing heard in left lobe after breathing treatment Chest x-ray obtained, negative for acute cardiopulmonary process Recommended close follow-up with primary care provider later this week or early next week Supportive care discussed-start cough suppressant Strict ER precautions discussed with patient  The patient was given the opportunity to ask questions.  All questions answered to their satisfaction.  The patient is in agreement to this plan.    Final Clinical Impressions(s) / UC Diagnoses   Final diagnoses:  Encounter for screening for COVID-19  Viral URI with cough  Wheezing     Discharge Instructions      The chest x-ray today looks good - no signs of pneumonia.    We gave you a breathing treatment today with helped open up your chest quite a bit.  Please continue to use albuterol inhaler at home every 4-6 hours as needed for wheezing or shortness of breath.  Recommend following up with your primary care provider within a week or so to recheck your symptoms  and make sure you are feeling better.  You have a viral upper respiratory infection.  Symptoms should improve over the next week to 10 days.  If you develop chest pain or shortness of breath, go to the emergency room.  We have tested you today for COVID-19 and influenza.  You will see the results in Mychart and we will call you with positive results.    Please stay home and isolate until you are aware of the results.    Some things that can make you feel better are: - Increased rest - Increasing fluid with water/sugar free electrolytes - Acetaminophen and ibuprofen as needed for fever/pain - Salt water gargling, chloraseptic spray and throat lozenges - OTC guaifenesin (Mucinex) 600 mg twice daily - Saline sinus flushes or a neti pot - Humidifying the air -Tessalon Perles as needed for dry cough.     ED Prescriptions     Medication Sig Dispense Auth. Provider   benzonatate (TESSALON) 100 MG capsule Take 1 capsule (100 mg total) by mouth 3 (three) times daily as needed for cough. Do not take with alcohol or  while driving or operating heavy machinery.  May cause drowsiness. 21 capsule Eulogio Bear, NP      PDMP not reviewed this encounter.   Eulogio Bear, NP 12/08/22 [phone]

## 2022-12-17 DIAGNOSIS — M9901 Segmental and somatic dysfunction of cervical region: Secondary | ICD-10-CM | POA: Diagnosis not present

## 2022-12-17 DIAGNOSIS — M9902 Segmental and somatic dysfunction of thoracic region: Secondary | ICD-10-CM | POA: Diagnosis not present

## 2022-12-17 DIAGNOSIS — M9903 Segmental and somatic dysfunction of lumbar region: Secondary | ICD-10-CM | POA: Diagnosis not present

## 2022-12-17 DIAGNOSIS — M9906 Segmental and somatic dysfunction of lower extremity: Secondary | ICD-10-CM | POA: Diagnosis not present

## 2022-12-18 DIAGNOSIS — M9902 Segmental and somatic dysfunction of thoracic region: Secondary | ICD-10-CM | POA: Diagnosis not present

## 2022-12-18 DIAGNOSIS — M9903 Segmental and somatic dysfunction of lumbar region: Secondary | ICD-10-CM | POA: Diagnosis not present

## 2022-12-18 DIAGNOSIS — M9906 Segmental and somatic dysfunction of lower extremity: Secondary | ICD-10-CM | POA: Diagnosis not present

## 2022-12-18 DIAGNOSIS — M9901 Segmental and somatic dysfunction of cervical region: Secondary | ICD-10-CM | POA: Diagnosis not present

## 2022-12-19 DIAGNOSIS — M9902 Segmental and somatic dysfunction of thoracic region: Secondary | ICD-10-CM | POA: Diagnosis not present

## 2022-12-19 DIAGNOSIS — M9906 Segmental and somatic dysfunction of lower extremity: Secondary | ICD-10-CM | POA: Diagnosis not present

## 2022-12-19 DIAGNOSIS — M9901 Segmental and somatic dysfunction of cervical region: Secondary | ICD-10-CM | POA: Diagnosis not present

## 2022-12-19 DIAGNOSIS — M9903 Segmental and somatic dysfunction of lumbar region: Secondary | ICD-10-CM | POA: Diagnosis not present

## 2022-12-24 DIAGNOSIS — M9903 Segmental and somatic dysfunction of lumbar region: Secondary | ICD-10-CM | POA: Diagnosis not present

## 2022-12-24 DIAGNOSIS — M9906 Segmental and somatic dysfunction of lower extremity: Secondary | ICD-10-CM | POA: Diagnosis not present

## 2022-12-24 DIAGNOSIS — M9902 Segmental and somatic dysfunction of thoracic region: Secondary | ICD-10-CM | POA: Diagnosis not present

## 2022-12-24 DIAGNOSIS — M9901 Segmental and somatic dysfunction of cervical region: Secondary | ICD-10-CM | POA: Diagnosis not present

## 2022-12-26 DIAGNOSIS — M9901 Segmental and somatic dysfunction of cervical region: Secondary | ICD-10-CM | POA: Diagnosis not present

## 2022-12-26 DIAGNOSIS — M9906 Segmental and somatic dysfunction of lower extremity: Secondary | ICD-10-CM | POA: Diagnosis not present

## 2022-12-26 DIAGNOSIS — M9903 Segmental and somatic dysfunction of lumbar region: Secondary | ICD-10-CM | POA: Diagnosis not present

## 2022-12-26 DIAGNOSIS — M9902 Segmental and somatic dysfunction of thoracic region: Secondary | ICD-10-CM | POA: Diagnosis not present

## 2023-01-05 DIAGNOSIS — M9903 Segmental and somatic dysfunction of lumbar region: Secondary | ICD-10-CM | POA: Diagnosis not present

## 2023-01-05 DIAGNOSIS — M9906 Segmental and somatic dysfunction of lower extremity: Secondary | ICD-10-CM | POA: Diagnosis not present

## 2023-01-05 DIAGNOSIS — M9902 Segmental and somatic dysfunction of thoracic region: Secondary | ICD-10-CM | POA: Diagnosis not present

## 2023-01-05 DIAGNOSIS — M9901 Segmental and somatic dysfunction of cervical region: Secondary | ICD-10-CM | POA: Diagnosis not present

## 2023-01-07 DIAGNOSIS — M9906 Segmental and somatic dysfunction of lower extremity: Secondary | ICD-10-CM | POA: Diagnosis not present

## 2023-01-07 DIAGNOSIS — M9901 Segmental and somatic dysfunction of cervical region: Secondary | ICD-10-CM | POA: Diagnosis not present

## 2023-01-07 DIAGNOSIS — M9902 Segmental and somatic dysfunction of thoracic region: Secondary | ICD-10-CM | POA: Diagnosis not present

## 2023-01-07 DIAGNOSIS — M9903 Segmental and somatic dysfunction of lumbar region: Secondary | ICD-10-CM | POA: Diagnosis not present

## 2023-01-22 DIAGNOSIS — N1831 Chronic kidney disease, stage 3a: Secondary | ICD-10-CM | POA: Diagnosis not present

## 2023-01-22 DIAGNOSIS — N1330 Unspecified hydronephrosis: Secondary | ICD-10-CM | POA: Diagnosis not present

## 2023-01-22 DIAGNOSIS — I10 Essential (primary) hypertension: Secondary | ICD-10-CM | POA: Diagnosis not present

## 2023-03-23 ENCOUNTER — Encounter: Payer: Self-pay | Admitting: Internal Medicine

## 2023-03-23 ENCOUNTER — Emergency Department: Payer: Medicare Other

## 2023-03-23 ENCOUNTER — Ambulatory Visit (INDEPENDENT_AMBULATORY_CARE_PROVIDER_SITE_OTHER): Payer: Medicare Other | Admitting: Family

## 2023-03-23 ENCOUNTER — Observation Stay: Payer: Medicare Other

## 2023-03-23 ENCOUNTER — Other Ambulatory Visit: Payer: Self-pay

## 2023-03-23 ENCOUNTER — Encounter: Payer: Self-pay | Admitting: Family

## 2023-03-23 ENCOUNTER — Observation Stay
Admission: EM | Admit: 2023-03-23 | Discharge: 2023-03-24 | Disposition: A | Discharge status: Home / Self Care | Payer: Medicare Other | Attending: Internal Medicine | Admitting: Internal Medicine

## 2023-03-23 VITALS — BP 118/78 | HR 109 | Temp 97.4°F | Ht 65.0 in | Wt 253.0 lb

## 2023-03-23 DIAGNOSIS — R7989 Other specified abnormal findings of blood chemistry: Secondary | ICD-10-CM | POA: Diagnosis not present

## 2023-03-23 DIAGNOSIS — R0902 Hypoxemia: Secondary | ICD-10-CM | POA: Diagnosis not present

## 2023-03-23 DIAGNOSIS — R55 Syncope and collapse: Secondary | ICD-10-CM | POA: Diagnosis not present

## 2023-03-23 DIAGNOSIS — D509 Iron deficiency anemia, unspecified: Secondary | ICD-10-CM | POA: Diagnosis not present

## 2023-03-23 DIAGNOSIS — J45909 Unspecified asthma, uncomplicated: Secondary | ICD-10-CM | POA: Diagnosis not present

## 2023-03-23 DIAGNOSIS — Z79899 Other long term (current) drug therapy: Secondary | ICD-10-CM | POA: Diagnosis not present

## 2023-03-23 DIAGNOSIS — E1122 Type 2 diabetes mellitus with diabetic chronic kidney disease: Secondary | ICD-10-CM | POA: Insufficient documentation

## 2023-03-23 DIAGNOSIS — K219 Gastro-esophageal reflux disease without esophagitis: Secondary | ICD-10-CM

## 2023-03-23 DIAGNOSIS — M79662 Pain in left lower leg: Secondary | ICD-10-CM | POA: Diagnosis not present

## 2023-03-23 DIAGNOSIS — R030 Elevated blood-pressure reading, without diagnosis of hypertension: Secondary | ICD-10-CM | POA: Diagnosis not present

## 2023-03-23 DIAGNOSIS — M7122 Synovial cyst of popliteal space [Baker], left knee: Secondary | ICD-10-CM | POA: Diagnosis not present

## 2023-03-23 DIAGNOSIS — Z6841 Body Mass Index (BMI) 40.0 and over, adult: Secondary | ICD-10-CM | POA: Insufficient documentation

## 2023-03-23 DIAGNOSIS — M79661 Pain in right lower leg: Secondary | ICD-10-CM | POA: Diagnosis not present

## 2023-03-23 DIAGNOSIS — R0602 Shortness of breath: Secondary | ICD-10-CM

## 2023-03-23 DIAGNOSIS — F419 Anxiety disorder, unspecified: Secondary | ICD-10-CM

## 2023-03-23 DIAGNOSIS — I129 Hypertensive chronic kidney disease with stage 1 through stage 4 chronic kidney disease, or unspecified chronic kidney disease: Secondary | ICD-10-CM | POA: Insufficient documentation

## 2023-03-23 DIAGNOSIS — N1832 Chronic kidney disease, stage 3b: Secondary | ICD-10-CM | POA: Diagnosis not present

## 2023-03-23 DIAGNOSIS — Z87891 Personal history of nicotine dependence: Secondary | ICD-10-CM | POA: Insufficient documentation

## 2023-03-23 DIAGNOSIS — N183 Chronic kidney disease, stage 3 unspecified: Secondary | ICD-10-CM

## 2023-03-23 DIAGNOSIS — E872 Acidosis, unspecified: Secondary | ICD-10-CM | POA: Diagnosis not present

## 2023-03-23 DIAGNOSIS — I350 Nonrheumatic aortic (valve) stenosis: Secondary | ICD-10-CM | POA: Insufficient documentation

## 2023-03-23 DIAGNOSIS — J9811 Atelectasis: Secondary | ICD-10-CM | POA: Diagnosis not present

## 2023-03-23 LAB — CBC WITH DIFFERENTIAL/PLATELET
Abs Immature Granulocytes: 0.03 10*3/uL (ref 0.00–0.07)
Basophils Absolute: 0.1 10*3/uL (ref 0.0–0.1)
Basophils Relative: 1 %
Eosinophils Absolute: 0.2 10*3/uL (ref 0.0–0.5)
Eosinophils Relative: 2 %
HCT: 35.8 % — ABNORMAL LOW (ref 36.0–46.0)
Hemoglobin: 11.5 g/dL — ABNORMAL LOW (ref 12.0–15.0)
Immature Granulocytes: 0 %
Lymphocytes Relative: 25 %
Lymphs Abs: 2.1 10*3/uL (ref 0.7–4.0)
MCH: 30.9 pg (ref 26.0–34.0)
MCHC: 32.1 g/dL (ref 30.0–36.0)
MCV: 96.2 fL (ref 80.0–100.0)
Monocytes Absolute: 0.6 10*3/uL (ref 0.1–1.0)
Monocytes Relative: 8 %
Neutro Abs: 5.4 10*3/uL (ref 1.7–7.7)
Neutrophils Relative %: 64 %
Platelets: 252 10*3/uL (ref 150–400)
RBC: 3.72 MIL/uL — ABNORMAL LOW (ref 3.87–5.11)
RDW: 13.2 % (ref 11.5–15.5)
WBC: 8.4 10*3/uL (ref 4.0–10.5)
nRBC: 0 % (ref 0.0–0.2)

## 2023-03-23 LAB — URINALYSIS, W/ REFLEX TO CULTURE (INFECTION SUSPECTED)
Bilirubin Urine: NEGATIVE
Glucose, UA: NEGATIVE mg/dL
Hgb urine dipstick: NEGATIVE
Ketones, ur: 5 mg/dL — AB
Nitrite: NEGATIVE
Protein, ur: NEGATIVE mg/dL
Specific Gravity, Urine: 1.045 — ABNORMAL HIGH (ref 1.005–1.030)
pH: 5 (ref 5.0–8.0)

## 2023-03-23 LAB — COMPREHENSIVE METABOLIC PANEL
ALT: 19 U/L (ref 0–44)
AST: 28 U/L (ref 15–41)
Albumin: 3.8 g/dL (ref 3.5–5.0)
Alkaline Phosphatase: 53 U/L (ref 38–126)
Anion gap: 10 (ref 5–15)
BUN: 25 mg/dL — ABNORMAL HIGH (ref 8–23)
CO2: 23 mmol/L (ref 22–32)
Calcium: 9.7 mg/dL (ref 8.9–10.3)
Chloride: 105 mmol/L (ref 98–111)
Creatinine, Ser: 1.28 mg/dL — ABNORMAL HIGH (ref 0.44–1.00)
GFR, Estimated: 44 mL/min — ABNORMAL LOW (ref >60)
Glucose, Bld: 119 mg/dL — ABNORMAL HIGH (ref 70–99)
Potassium: 4 mmol/L (ref 3.5–5.1)
Sodium: 138 mmol/L (ref 135–145)
Total Bilirubin: 0.7 mg/dL (ref 0.3–1.2)
Total Protein: 7 g/dL (ref 6.5–8.1)

## 2023-03-23 LAB — LACTIC ACID, PLASMA
Lactic Acid, Venous: 2.1 mmol/L (ref 0.5–1.9)
Lactic Acid, Venous: 1.2 mmol/L (ref 0.5–1.9)

## 2023-03-23 LAB — D-DIMER, QUANTITATIVE: D-Dimer, Quant: 0.96 ug/mL-FEU — ABNORMAL HIGH (ref 0.00–0.50)

## 2023-03-23 LAB — TROPONIN I (HIGH SENSITIVITY): Troponin I (High Sensitivity): 8 ng/L (ref <18)

## 2023-03-23 LAB — BRAIN NATRIURETIC PEPTIDE: B Natriuretic Peptide: 30.9 pg/mL (ref 0.0–100.0)

## 2023-03-23 MED ORDER — POLYETHYLENE GLYCOL 3350 17 G PO PACK: 17.0000 g | PACK | Freq: Every day | ORAL | Status: DC | PRN

## 2023-03-23 MED ORDER — ALBUTEROL SULFATE (2.5 MG/3ML) 0.083% IN NEBU: 2.5000 mg | INHALATION_SOLUTION | Freq: Four times a day (QID) | RESPIRATORY_TRACT | Status: DC | PRN

## 2023-03-23 MED ORDER — ENOXAPARIN SODIUM 60 MG/0.6ML IJ SOSY
0.5000 mg/kg | PREFILLED_SYRINGE | INTRAMUSCULAR | Status: DC
  Administered 2023-03-23: 57.5 mg via SUBCUTANEOUS
  Filled 2023-03-23: qty 0.6

## 2023-03-23 MED ORDER — ONDANSETRON HCL 4 MG PO TABS: 4.0000 mg | ORAL_TABLET | Freq: Four times a day (QID) | ORAL | Status: DC | PRN

## 2023-03-23 MED ORDER — ALPRAZOLAM 0.5 MG PO TABS: 0.5000 mg | ORAL_TABLET | Freq: Three times a day (TID) | ORAL | Status: DC | PRN

## 2023-03-23 MED ORDER — VITAMIN D 25 MCG (1000 UNIT) PO TABS
1000.0000 [IU] | ORAL_TABLET | Freq: Every day | ORAL | Status: DC
  Administered 2023-03-24: 1000 [IU] via ORAL
  Filled 2023-03-23: qty 1

## 2023-03-23 MED ORDER — SODIUM CHLORIDE 0.9 % IV BOLUS
500.0000 mL | Freq: Once | INTRAVENOUS | Status: AC
  Administered 2023-03-23: 500 mL via INTRAVENOUS

## 2023-03-23 MED ORDER — ACETAMINOPHEN 325 MG PO TABS
650.0000 mg | ORAL_TABLET | Freq: Four times a day (QID) | ORAL | Status: DC | PRN
  Administered 2023-03-24: 650 mg via ORAL
  Filled 2023-03-23: qty 2

## 2023-03-23 MED ORDER — PANTOPRAZOLE SODIUM 40 MG PO TBEC: 40.0000 mg | DELAYED_RELEASE_TABLET | Freq: Every day | ORAL | Status: DC | PRN

## 2023-03-23 MED ORDER — METHOCARBAMOL 500 MG PO TABS: 500.0000 mg | ORAL_TABLET | Freq: Three times a day (TID) | ORAL | Status: DC | PRN

## 2023-03-23 MED ORDER — ACETAMINOPHEN 650 MG RE SUPP: 650.0000 mg | Freq: Four times a day (QID) | RECTAL | Status: DC | PRN

## 2023-03-23 MED ORDER — IOHEXOL 350 MG/ML SOLN
75.0000 mL | Freq: Once | INTRAVENOUS | Status: AC | PRN
  Administered 2023-03-23: 75 mL via INTRAVENOUS

## 2023-03-23 MED ORDER — SODIUM CHLORIDE 0.9 % IV SOLN: INTRAVENOUS | Status: DC

## 2023-03-23 MED ORDER — ENOXAPARIN SODIUM 40 MG/0.4ML IJ SOSY: 40.0000 mg | PREFILLED_SYRINGE | INTRAMUSCULAR | Status: DC

## 2023-03-23 MED ORDER — BUPROPION HCL ER (XL) 300 MG PO TB24
300.0000 mg | ORAL_TABLET | Freq: Every day | ORAL | Status: DC
  Administered 2023-03-24: 300 mg via ORAL
  Filled 2023-03-23: qty 1

## 2023-03-23 MED ORDER — ONDANSETRON HCL 4 MG/2ML IJ SOLN: 4.0000 mg | Freq: Four times a day (QID) | INTRAMUSCULAR | Status: DC | PRN

## 2023-03-23 NOTE — H&P (Signed)
History and Physical    Patient: Julie Rush [id number] DOB: 1951/06/10 DOA: 03/23/2023 DOS: the patient was seen and examined on 03/23/2023 PCP: Burnard Hawthorne, FNP  Patient coming from: Home  Chief Complaint:  Chief Complaint  Patient presents with   Near Syncope   HPI: Julie Rush is a 72 y.o. female with medical history significant of hypertension, chronic kidney disease stage III, anxiety, GERD and osteoarthritis.  She states that she has been holding her Toprol for the past 2 weeks and she has been having low blood pressures.  She has been feeling dizzy sometimes she feels her heart racing in the morning she is also having trouble sleeping.  She went to the doctor's office this morning was having trouble breathing and had a near syncopal episode where she felt like she was drifting off.  Her blood pressure is usually been high in the past.  CT scan of the chest negative for pulmonary embolism.  Hospitalist services contacted for further evaluation. Review of Systems: Review of Systems  Constitutional:  Positive for malaise/fatigue and weight loss. Negative for chills and fever.  HENT:  Negative for hearing loss.   Eyes:  Negative for blurred vision.  Respiratory:  Positive for shortness of breath. Negative for cough.   Cardiovascular:  Negative for chest pain.  Gastrointestinal:  Positive for heartburn. Negative for abdominal pain, blood in stool, constipation, diarrhea, nausea and vomiting.  Genitourinary:  Negative for dysuria and urgency.  Musculoskeletal:  Positive for joint pain. Negative for myalgias.  Skin:  Negative for itching and rash.  Neurological:  Positive for dizziness. Negative for headaches.  Endo/Heme/Allergies:  Does not bruise/bleed easily.  Psychiatric/Behavioral:  The patient is nervous/anxious.     Past Medical History:  Diagnosis Date   Anginal pain    Arthritis    "knees, back, fingers, shoulders, neck"   Chronic lower back pain     Depression 06/08/12   "related to not getting definitive dx of what's wrong"   Exertional dyspnea    GERD (gastroesophageal reflux disease)    H/O hiatal hernia    Headache(784.0)    History of stomach ulcers 1980's   Hypertension    Migraines    4-5x/yr   Pneumonia    PONV (postoperative nausea and vomiting)    Seasonal asthma    Sleep apnea 2013   "dx'd borderline"   Type II diabetes mellitus 2013   "borderline"   Vertigo    last episode over 6 mos ago   Past Surgical History:  Procedure Laterality Date   APPENDECTOMY  1960's   BREAST BIOPSY Right    right   CATARACT EXTRACTION W/PHACO Left 11/01/2018   Procedure: CATARACT EXTRACTION PHACO AND INTRAOCULAR LENS PLACEMENT ([place]) LEFT;  Surgeon: Eulogio Bear, MD;  Location: [place];  Service: Ophthalmology;  Laterality: Left;   CATARACT EXTRACTION W/PHACO Right 11/29/2018   Procedure: CATARACT EXTRACTION PHACO AND INTRAOCULAR LENS PLACEMENT ([facility]) RIGHT;  Surgeon: Eulogio Bear, MD;  Location: [place];  Service: Ophthalmology;  Laterality: Right;  Diabetes - diet conrolled sleep apnea   CHOLECYSTECTOMY  2006   COLONOSCOPY WITH PROPOFOL N/A 10/30/2020   Procedure: COLONOSCOPY WITH PROPOFOL;  Surgeon: Lesly Rubenstein, MD;  Location: [facility];  Service: Endoscopy;  Laterality: N/A;   EXPLORATORY LAPAROTOMY  late 1990's   "blocked colon S/P hernia repair"   HERNIA REPAIR     "@ least 3 abdominal hernia repairs"   LUMBAR DISC SURGERY  1981   SHOULDER ARTHROSCOPY WITH ROTATOR CUFF REPAIR AND SUBACROMIAL DECOMPRESSION Right 05/25/2019   Procedure: Right shoulder mini open rotator cuff repair, subacromial decompression;  Surgeon: Susa Day, MD;  Location: [facility];  Service: Orthopedics;  Laterality: Right;  90 mins   VAGINAL HYSTERECTOMY  1981   Social History:  reports that she quit smoking about 47 years ago. Her smoking use included cigarettes. She has a 28.00 pack-year smoking history.  She has been exposed to tobacco smoke. She has never used smokeless tobacco. She reports that she does not drink alcohol and does not use drugs.  Allergies  Allergen Reactions   Codeine Nausea And Vomiting   Prednisone Palpitations   Sulfa Antibiotics Itching and Rash   Trimethoprim Hives    Family History  Problem Relation Age of Onset   Arthritis Mother    Cancer Father        prostate   Hyperlipidemia Father    Alcohol abuse Father    Arthritis Father    Heart disease Father    Stroke Father    Hypertension Father    Diabetes Father    Cancer Sister        ovary   Arthritis Sister    Cancer Brother        thyroid   Arthritis Brother    Cancer Sister        breast   Arthritis Sister    Breast cancer Sister 34   Arthritis Maternal Grandmother    Arthritis Maternal Grandfather     Prior to Admission medications   Medication Sig Start Date End Date Taking? Authorizing Provider  acetaminophen (TYLENOL) 500 MG tablet Take 1,000 mg by mouth every 6 (six) hours as needed for moderate pain or headache.    Yes [provider]  albuterol (VENTOLIN HFA) 108 (90 Base) MCG/ACT inhaler INHALE 2 PUFFS BY MOUTH EVERY 6 HOURS AS NEEDED FOR WHEEZE OR SHORTNESS OF BREATH 02/24/22  Yes Arnett, Yvetta Coder, FNP  b complex vitamins tablet Take 2 tablets by mouth daily.   Yes [provider]  buPROPion (WELLBUTRIN XL) 300 MG 24 hr tablet TAKE 1 TABLET (300 MG TOTAL) BY MOUTH EVERY MORNING. 07/06/22  Yes Dutch Quint B, FNP  Cholecalciferol (VITAMIN D3 PO) Take by mouth daily at 2 PM.   Yes [provider]  Coenzyme Q10 200 MG/GM POWD Take by mouth.   Yes [provider]  methocarbamol (ROBAXIN) 500 MG tablet TAKE 1 TABLET BY MOUTH EVERY 8 HOURS AS NEEDED FOR MUSCLE SPASM 07/25/22  Yes Burnard Hawthorne, FNP  metoprolol succinate (TOPROL-XL) 50 MG 24 hr tablet TAKE ONE TABLET WITH OR IMMEDIATELY FOLLOWING A MEAL. 05/12/22  Yes Arnett, Yvetta Coder, FNP  omeprazole  (PRILOSEC) 20 MG capsule Take 20 mg by mouth daily as needed (heartburn).    Yes [provider]    Physical Exam: Vitals:   03/23/23 1300 03/23/23 1315 03/23/23 1330 03/23/23 1515  BP: (!) 149/78  133/61   Pulse: 94 88 88 100  Resp: 20 14 15 16   Temp:      SpO2: 95% 99% 97% 99%  Weight:      Height:       Physical Exam HENT:     Head: Normocephalic.     Mouth/Throat:     Pharynx: No oropharyngeal exudate.  Eyes:     General: Lids are normal.     Conjunctiva/sclera: Conjunctivae normal.  Cardiovascular:     Rate and  Rhythm: Normal rate and regular rhythm.     Heart sounds: Normal heart sounds, S1 normal and S2 normal.  Pulmonary:     Breath sounds: No decreased breath sounds, wheezing, rhonchi or rales.  Abdominal:     Palpations: Abdomen is soft.     Tenderness: There is no abdominal tenderness.  Musculoskeletal:     Right lower leg: No swelling.     Left lower leg: No swelling.  Skin:    General: Skin is warm.     Findings: No rash.  Neurological:     Mental Status: She is alert and oriented to person, place, and time.     Data Reviewed: Glucose 119, creatinine 1.28 with a GFR 44, lactic acid 2.1, CBC white blood cell count 8.4, hemoglobin 11.5 and platelet count 252 D-dimer 0.96 CT scan of the chest with contrast showed no evidence of pulmonary embolism no acute cardiopulmonary process identified, chest x-ray negative  Assessment and Plan: * Near syncope IV fluid hydration, check orthostatic vital signs.  Continue to hold Toprol.  Check a.m. cortisol and TSH.  Echocardiogram.  May end up needing a heart monitor as outpatient.  Shortness of breath CT scan of the chest negative for pulmonary embolism or other cardiopulmonary process.  Chest x-ray also negative.  Continue to monitor.  On as needed albuterol.  CKD (chronic kidney disease) stage 3, GFR 30-59 ml/min CKD stage IIIb creatinine 1.28 with a GFR 44  Elevated d-dimer CT scan of the chest  negative.  Will get an ultrasound lower extremity rule out DVT.  Lactic acidosis Initial lactic acid 2.1 and repeat down to 1.2.  IV fluid hydration.  Watch for any signs of fever.  Does not seem to be an infection.  Will send off for urine analysis.  Iron deficiency anemia Follow-up as outpatient  Obesity, Class III, BMI 40-49.9 (morbid obesity) With current height and weight in computer BMI 42.93  GERD (gastroesophageal reflux disease) On as needed PPI  Anxiety On Wellbutrin.  Will put him as needed Xanax, just in case we are dealing with panic attacks.      Advance Care Planning:   Code Status: Full Code   Consults: None  Family Communication: Husband at bedside  Severity of Illness: The appropriate patient status for this patient is OBSERVATION. Observation status is judged to be reasonable and necessary in order to provide the required intensity of service to ensure the patient's safety. The patient's presenting symptoms, physical exam findings, and initial radiographic and laboratory data in the context of their medical condition is felt to place them at decreased risk for further clinical deterioration. Furthermore, it is anticipated that the patient will be medically stable for discharge from the hospital within 2 midnights of admission.   Author: Loletha Grayer, MD 03/23/2023 3:48 PM  For on call review [email or web address].

## 2023-03-23 NOTE — Assessment & Plan Note (Signed)
Follow up as outpatient.  

## 2023-03-23 NOTE — Assessment & Plan Note (Addendum)
Patient was hydrated during the hospital course.  Patient not orthostatic.  Continue to hold Toprol.  A.m. cortisol normal range.  TSH normal range.  Echocardiogram showed mild aortic stenosis with a slightly elevated ejection fraction.  No arrhythmias here.  May end up needing a heart monitor as outpatient.

## 2023-03-23 NOTE — Assessment & Plan Note (Signed)
CT scan of the chest negative for pulmonary embolism or other cardiopulmonary process.  Chest x-ray also negative.  No recurrence of shortness of breath.

## 2023-03-23 NOTE — ED Notes (Signed)
Orthostatic vital signs: Lying BP: 134/74 Lying HR: 92 Sitting BP: 140/74 Sitting HR: 91 Standing BP 132/77 Standing HR: 118

## 2023-03-23 NOTE — Assessment & Plan Note (Signed)
On as needed PPI

## 2023-03-23 NOTE — Assessment & Plan Note (Addendum)
CT scan of the chest negative.  Will get an ultrasound lower extremity rule out DVT.

## 2023-03-23 NOTE — Assessment & Plan Note (Addendum)
CKD stage IIIb.  Creatinine actually improved to 1.17 with IV fluid hydration.

## 2023-03-23 NOTE — ED Notes (Signed)
Called for transport

## 2023-03-23 NOTE — Assessment & Plan Note (Addendum)
While speaking with patient during exam, she suddenly stated ' I cannot breathe' and was pale in color. She denied CP at that time. She endorsed feeling jittery and anxious.  I placed a pulse oximeter on patient and sa02 on room air was initially sa02 93 and then precipitous drop to 80s on sa02. Pulse oximeter read HR 54 however radial pulse 110. Question if Sao2 reading correct. At that time I placed nasal cannula on patient and titrated up to 5L until o2 reached 99%. HR then read 110 on pulse oximeter. Called 911 while we monitored patient. Once EMS arrived,  EKG performed by EMS initially which showed SR with left axis deviation.  A couple of minutes later, patient became more talkative and was feeling better; we agreed to wean off of oxygen to see how she felt; sa02 remained 99% on room air. We then attempted to perform EKG with our clinic machine however patient experienced another episode of feeling 'I cannot breathe' and again became pale. At that time, we discussed to proceed with prompt ED evaluation for more thorough evaluation of hypoxia , ischemia and observation.  Of note, I attempted to give report to nurse [facility] ED at [id number] however unable to reach as phone rang continuously. At time of closing this note, patient is in the ED.

## 2023-03-23 NOTE — ED Provider Notes (Signed)
[facility] Provider Note    Event Date/Time   First MD Initiated Contact with Patient 03/23/23 1117     (approximate)   History   Near Syncope   HPI  Julie Rush is a 72 y.o. female here with syncopal episode/chest pain/shortness of breath.  The patient states that for the last 2 weeks or so, she has had progressively worsening lightheadedness and dizzy with standing.  She checked her blood pressure at home and noted that it was in the 90s, so she has since stopped her blood pressure meds over these last 2 weeks.  This did not still resolve her symptoms.  She is remained intermittently hypotensive since then.  She is felt somewhat fatigued.  She went to her primary doctor today.  While sitting there, she began to develop shortness of breath and felt like she could not breathe.  She felt lightheaded.  She felt like she was going to pass out.  EMS subsequently called.  She not actually syncopized.  No focal numbness or weakness.  No chest pain.     Physical Exam   Triage Vital Signs: ED Triage Vitals [03/23/23 1124]  Enc Vitals Group     BP (!) 156/97     Pulse Rate 92     Resp 18     Temp      Temp src      SpO2 100 %     Weight 258 lb (117 kg)     Height 5\' 5"  (1.651 m)     Head Circumference      Peak Flow      Pain Score 0     Pain Loc      Pain Edu?      Excl. in [place]?     Most recent vital signs: Vitals:   03/23/23 1300 03/23/23 1315  BP: (!) 149/78   Pulse: 94 88  Resp: 20 14  Temp:    SpO2: 95% 99%     General: Awake, no distress.  CV:  Good peripheral perfusion.  Regular rate and rhythm.  No murmurs. Resp:  Normal work of breathing.  Lungs clear. Abd:  No distention.  No tenderness. Other:  Cranial nerves II through XII intact.  Strength 5 5 bilateral upper and lower extremities were normal station light touch.  No focal neurological deficits.   ED Results / Procedures / Treatments   Labs (all labs ordered are listed,  but only abnormal results are displayed) Labs Reviewed  CBC WITH DIFFERENTIAL/PLATELET - Abnormal; Notable for the following components:      Result Value   RBC 3.72 (*)    Hemoglobin 11.5 (*)    HCT 35.8 (*)    All other components within normal limits  COMPREHENSIVE METABOLIC PANEL - Abnormal; Notable for the following components:   Glucose, Bld 119 (*)    BUN 25 (*)    Creatinine, Ser 1.28 (*)    GFR, Estimated 44 (*)    All other components within normal limits  LACTIC ACID, PLASMA - Abnormal; Notable for the following components:   Lactic Acid, Venous 2.1 (*)    All other components within normal limits  D-DIMER, QUANTITATIVE - Abnormal; Notable for the following components:   D-Dimer, Quant 0.96 (*)    All other components within normal limits  BRAIN NATRIURETIC PEPTIDE  LACTIC ACID, PLASMA  TROPONIN I (HIGH SENSITIVITY)     EKG Normal sinus rhythm, ventricular 94.  PR 186,  QRS 112, QTc 440.  No acute ST elevation or depression.  No acute evidence of acute ischemia or infarct.   RADIOLOGY Chest x-ray: No acute normality CT angio: Pending   I also independently reviewed and agree with radiologist interpretations.   PROCEDURES:  Critical Care performed: No  Procedures     MEDICATIONS ORDERED IN ED: Medications  sodium chloride 0.9 % bolus 500 mL (has no administration in time range)     IMPRESSION / MDM / ASSESSMENT AND PLAN / ED COURSE  I reviewed the triage vital signs and the nursing notes.                              Differential diagnosis includes, but is not limited to, anemia, arrhythmia, ACS, PE, electrolyte abnormality, orthostasis  Patient's presentation is most consistent with acute presentation with potential threat to life or bodily function.  The patient is on the cardiac monitor to evaluate for evidence of arrhythmia and/or significant heart rate changes  72 year old female here with near syncope and intermittent hypotension for the  last 2 weeks.  Here, the patient has had several episodes with good waveform going down to 86 to 87% on room air.  She has also been tachycardic whenever she sits up.  Unclear whether she has transient hypoperfusion and tachycardia due to orthostasis and dehydration, versus arrhythmia, versus valvular disease, versus PE.  CT angio will be sent.  Given her age, comorbidities, mild lactic acid elevation and recurrent decreases in sats, will admit for observation.  Chest x-ray is clear.  Initial troponin negative.  Labs otherwise overall reassuring.  No major anemia.  Creatinine is at baseline.  D-dimer positive, CT angio ordered.   FINAL CLINICAL IMPRESSION(S) / ED DIAGNOSES   Final diagnoses:  Syncope and collapse  Elevated lactic acid level     Rx / DC Orders   ED Discharge Orders     None        Note:  This document was prepared using Dragon voice recognition software and may include unintentional dictation errors.   Duffy Bruce, MD 03/23/23 [id number]

## 2023-03-23 NOTE — Assessment & Plan Note (Signed)
With current height and weight in computer BMI 42.93

## 2023-03-23 NOTE — Assessment & Plan Note (Addendum)
On Wellbutrin.  Will put on as needed Xanax, just in case we are dealing with panic attacks.

## 2023-03-23 NOTE — Progress Notes (Signed)
Assessment & Plan:  Hypoxia Assessment & Plan: While speaking with patient during exam, she suddenly stated ' I cannot breathe' and was pale in color. She denied CP at that time. She endorsed feeling jittery and anxious.  I placed a pulse oximeter on patient and sa02 on room air was initially sa02 93 and then precipitous drop to 80s on sa02. Pulse oximeter read HR 54 however radial pulse 110. Question if Sao2 reading correct. At that time I placed nasal cannula on patient and titrated up to 5L until o2 reached 99%. HR then read 110 on pulse oximeter. Called 911 while we monitored patient. Once EMS arrived,  EKG performed by EMS initially which showed SR with left axis deviation.  A couple of minutes later, patient became more talkative and was feeling better; we agreed to wean off of oxygen to see how she felt; sa02 remained 99% on room air. We then attempted to perform EKG with our clinic machine however patient experienced another episode of feeling 'I cannot breathe' and again became pale. At that time, we discussed to proceed with prompt ED evaluation for more thorough evaluation of hypoxia , ischemia and observation.  Of note, I attempted to give report to nurse [facility] ED at [id number] however unable to reach as phone rang continuously. At time of closing this note, patient is in the ED.   Orders: -     EKG 12-Lead     Return precautions given.   Risks, benefits, and alternatives of the medications and treatment plan prescribed today were discussed, and patient expressed understanding.   Education regarding symptom management and diagnosis given to patient on AVS either electronically or printed.  Return for [employer] upcoming or due, schedule.  Mable Paris, FNP  Subjective:    Patient ID: Julie Rush, female    DOB: [birth year], 72 y.o.   MRN: [id number]  CC: Julie Rush is a 72 y.o. female who presents today for follow up.   HPI: Here today for primary  complaints of blood pressure at home been low at home, reports 90/60 She complains of associated  dizziness when getting up OOB and increased SOB.   Occasional palpitations.  She stopped metoprolol succinate a week ago.  No syncopal episode, CP , left arm pain, palipations, jaw pain.  Former smoker, quit 1976  She has been using albuterol inhaler prn for seasonal allergies. She denies cough, congestion, fever.    Patient had previous stress test 02/09/2017, nondiagnostic for ischemia  Allergies: Codeine, Prednisone, Sulfa antibiotics, and Trimethoprim Current Outpatient Medications on File Prior to Visit  Medication Sig Dispense Refill   acetaminophen (TYLENOL) 500 MG tablet Take 1,000 mg by mouth every 6 (six) hours as needed for moderate pain or headache.      albuterol (VENTOLIN HFA) 108 (90 Base) MCG/ACT inhaler INHALE 2 PUFFS BY MOUTH EVERY 6 HOURS AS NEEDED FOR WHEEZE OR SHORTNESS OF BREATH 6.7 each 1   b complex vitamins tablet Take 2 tablets by mouth daily.     buPROPion (WELLBUTRIN XL) 300 MG 24 hr tablet TAKE 1 TABLET (300 MG TOTAL) BY MOUTH EVERY MORNING. 90 tablet 1   Cholecalciferol (VITAMIN D3 PO) Take by mouth daily at 2 PM.     Coenzyme Q10 200 MG/GM POWD Take by mouth.     methocarbamol (ROBAXIN) 500 MG tablet TAKE 1 TABLET BY MOUTH EVERY 8 HOURS AS NEEDED FOR MUSCLE SPASM 20 tablet 1   metoprolol succinate (TOPROL-XL) 50 MG  24 hr tablet TAKE ONE TABLET WITH OR IMMEDIATELY FOLLOWING A MEAL. 90 tablet 3   omeprazole (PRILOSEC) 20 MG capsule Take 20 mg by mouth daily as needed (heartburn).      No current facility-administered medications on file prior to visit.    Review of Systems  Constitutional:  Negative for chills and fever.  Respiratory:  Positive for shortness of breath. Negative for cough.   Cardiovascular:  Negative for chest pain, palpitations and leg swelling.  Gastrointestinal:  Negative for nausea and vomiting.  Neurological:  Positive for dizziness and  light-headedness. Negative for syncope.      Objective:    BP 118/78   Pulse (!) 109   Temp (!) 97.4 F (36.3 C) (Oral)   Ht 5\' 5"  (1.651 m)   Wt 253 lb (114.8 kg)   SpO2 97%   BMI 42.10 kg/m  BP Readings from Last 3 Encounters:  03/23/23 (!) 156/97  03/23/23 118/78  12/08/22 137/82   Wt Readings from Last 3 Encounters:  03/23/23 258 lb (117 kg)  03/23/23 253 lb (114.8 kg)  10/21/22 267 lb (121.1 kg)    Physical Exam Vitals reviewed.  Constitutional:      Appearance: She is well-developed.  Eyes:     Conjunctiva/sclera: Conjunctivae normal.  Cardiovascular:     Rate and Rhythm: Regular rhythm. Tachycardia present.     Pulses: Normal pulses.     Heart sounds: Normal heart sounds.  Pulmonary:     Effort: Pulmonary effort is normal.     Breath sounds: Normal breath sounds. No wheezing, rhonchi or rales.  Skin:    General: Skin is warm and dry.  Neurological:     Mental Status: She is alert.  Psychiatric:        Speech: Speech normal.        Behavior: Behavior normal.        Thought Content: Thought content normal.    I have spent 30 minutes with a patient including precharting, exam, reviewing medical records, and discussion plan of care.

## 2023-03-23 NOTE — Progress Notes (Signed)
PHARMACIST - PHYSICIAN COMMUNICATION  CONCERNING:  Enoxaparin (Lovenox) for DVT Prophylaxis    RECOMMENDATION: Patient was prescribed enoxaprin 40mg  q24 hours for VTE prophylaxis.   Filed Weights   03/23/23 1124  Weight: 117 kg (258 lb)    Body mass index is 42.93 kg/m.  Estimated Creatinine Clearance: 50.1 mL/min (A) (by C-G formula based on SCr of 1.28 mg/dL (H)).   Based on [place] patient is candidate for enoxaparin 0.5mg /kg TBW SQ every 24 hours based on BMI being >30.   DESCRIPTION: Pharmacy has adjusted enoxaparin dose per [facility] policy.  Patient is now receiving enoxaparin 57.5 mg every 24 hours    Alison Murray, PharmD Clinical Pharmacist  03/23/2023 3:18 PM

## 2023-03-23 NOTE — Assessment & Plan Note (Signed)
Initial lactic acid 2.1 and repeat down to 1.2.  IV fluid hydration.  Watch for any signs of fever.  Does not seem to be an infection.  Will send off for urine analysis.

## 2023-03-23 NOTE — ED Triage Notes (Signed)
Pt presents to the ED via [facility]. Pt had a near syncopal episode pta at her PCP. She said she was just sitting there waiting on her doctor. She went to PCP because she has been having low BP for the past two weeks. Pt states that her BP has been [id number] systolic. Pt states that she has a hx of hypertension and stopped taking her antihypertensive two weeks ago when she noticed her BP was low. Pt states that her symptoms have now resolved, but still feels a little jittery.

## 2023-03-24 ENCOUNTER — Observation Stay (HOSPITAL_BASED_OUTPATIENT_CLINIC_OR_DEPARTMENT_OTHER)
Admit: 2023-03-24 | Discharge: 2023-03-24 | Disposition: A | Discharge status: Home / Self Care | Payer: Medicare Other | Attending: Internal Medicine | Admitting: Internal Medicine

## 2023-03-24 DIAGNOSIS — M7122 Synovial cyst of popliteal space [Baker], left knee: Secondary | ICD-10-CM | POA: Diagnosis not present

## 2023-03-24 DIAGNOSIS — I350 Nonrheumatic aortic (valve) stenosis: Secondary | ICD-10-CM | POA: Diagnosis not present

## 2023-03-24 DIAGNOSIS — F419 Anxiety disorder, unspecified: Secondary | ICD-10-CM | POA: Diagnosis not present

## 2023-03-24 DIAGNOSIS — R7989 Other specified abnormal findings of blood chemistry: Secondary | ICD-10-CM | POA: Diagnosis not present

## 2023-03-24 DIAGNOSIS — D508 Other iron deficiency anemias: Secondary | ICD-10-CM | POA: Diagnosis not present

## 2023-03-24 DIAGNOSIS — N1832 Chronic kidney disease, stage 3b: Secondary | ICD-10-CM | POA: Diagnosis not present

## 2023-03-24 DIAGNOSIS — R0602 Shortness of breath: Secondary | ICD-10-CM | POA: Diagnosis not present

## 2023-03-24 DIAGNOSIS — R011 Cardiac murmur, unspecified: Secondary | ICD-10-CM

## 2023-03-24 DIAGNOSIS — R55 Syncope and collapse: Secondary | ICD-10-CM | POA: Diagnosis not present

## 2023-03-24 DIAGNOSIS — K219 Gastro-esophageal reflux disease without esophagitis: Secondary | ICD-10-CM | POA: Diagnosis not present

## 2023-03-24 DIAGNOSIS — E872 Acidosis, unspecified: Secondary | ICD-10-CM | POA: Diagnosis not present

## 2023-03-24 LAB — BASIC METABOLIC PANEL
Anion gap: 7 (ref 5–15)
BUN: 19 mg/dL (ref 8–23)
CO2: 23 mmol/L (ref 22–32)
Calcium: 8.5 mg/dL — ABNORMAL LOW (ref 8.9–10.3)
Chloride: 106 mmol/L (ref 98–111)
Creatinine, Ser: 1.17 mg/dL — ABNORMAL HIGH (ref 0.44–1.00)
GFR, Estimated: 49 mL/min — ABNORMAL LOW (ref >60)
Glucose, Bld: 94 mg/dL (ref 70–99)
Potassium: 3.9 mmol/L (ref 3.5–5.1)
Sodium: 136 mmol/L (ref 135–145)

## 2023-03-24 LAB — TSH: TSH: 1.663 u[IU]/mL (ref 0.350–4.500)

## 2023-03-24 LAB — ECHOCARDIOGRAM COMPLETE
AR max vel: 2.05 cm2
AV Area VTI: 2.23 cm2
AV Area mean vel: 2.15 cm2
AV Mean grad: 8 mmHg
AV Peak grad: 18.1 mmHg
Ao pk vel: 2.13 m/s
Area-P 1/2: 4.01 cm2
Height: 65 in
MV VTI: 2.52 cm2
S' Lateral: 2.5 cm
Weight: 4128 oz

## 2023-03-24 LAB — CORTISOL-AM, BLOOD: Cortisol - AM: 9.3 ug/dL (ref 6.7–22.6)

## 2023-03-24 MED ORDER — ALPRAZOLAM 0.5 MG PO TABS: 0.5000 mg | ORAL_TABLET | Freq: Three times a day (TID) | ORAL | 0 refills | Status: DC | PRN

## 2023-03-24 NOTE — Assessment & Plan Note (Signed)
Asymptomatic.  No further workup.

## 2023-03-24 NOTE — Assessment & Plan Note (Signed)
Mild on echocardiogram.  Will need a repeat echocardiogram in the future.

## 2023-03-24 NOTE — Hospital Course (Addendum)
72 y.o. female with medical history significant of hypertension, chronic kidney disease stage III, anxiety, GERD and osteoarthritis.  She states that she has been holding her Toprol for the past 2 weeks and she has been having low blood pressures.  She has been feeling dizzy sometimes she feels her heart racing in the morning she is also having trouble sleeping.  She went to the doctor's office this morning was having trouble breathing and had a near syncopal episode where she felt like she was drifting off.  Her blood pressure is usually been high in the past.  CT scan of the chest negative for pulmonary embolism.  Hospitalist services contacted for further evaluation   4/2.  Patient feeling much better.  Offers no complaints.  Not hypoxic during the hospital course.  Patient was not orthostatic.  Patient was hydrated overnight.  Ultrasound of the lower extremity showed no DVT but did have a Baker's cyst left leg.  Patient is asymptomatic with this.  Discussed the possibility of panic attack causing her symptoms.  No further issues while here in the hospital.

## 2023-03-24 NOTE — Progress Notes (Signed)
*  PRELIMINARY RESULTS* Echocardiogram 2D Echocardiogram has been performed.  Julie Rush 03/24/2023, 1:01 PM

## 2023-03-24 NOTE — TOC CM/SW Note (Signed)
  Transition of Care [facility]) Screening Note   Patient Details  Name: Julie Rush Date of Birth: 12/24/1950   Transition of Care [facility]) CM/SW Contact:    Gerilyn Pilgrim, LCSW Phone Number: 03/24/2023, 10:15 AM    Transition of Care Department [facility]) has reviewed patient and no TOC needs have been identified at this time. We will continue to monitor patient advancement through interdisciplinary progression rounds. If new patient transition needs arise, please place a TOC consult.

## 2023-03-24 NOTE — Plan of Care (Signed)
  Problem: Education: Goal: Knowledge of General Education information will improve Description: Including pain rating scale, medication(s)/side effects and non-pharmacologic comfort measures Outcome: Progressing   Problem: Clinical Measurements: Goal: Ability to maintain clinical measurements within normal limits will improve Outcome: Progressing Goal: Respiratory complications will improve Outcome: Progressing Goal: Cardiovascular complication will be avoided Outcome: Progressing   Problem: Coping: Goal: Level of anxiety will decrease Outcome: Progressing   Problem: Elimination: Goal: Will not experience complications related to urinary retention Outcome: Progressing   Problem: Safety: Goal: Ability to remain free from injury will improve Outcome: Progressing

## 2023-03-24 NOTE — Discharge Instructions (Signed)
Stay hydrated Consider outpatient heart monitor

## 2023-03-24 NOTE — Discharge Summary (Signed)
Physician Discharge Summary   Patient: Julie Rush MRN: [id number] DOB: Jan 22, 1951  Admit date:     03/23/2023  Discharge date: 03/24/23  Discharge Physician: Loletha Grayer   PCP: Burnard Hawthorne, FNP   Recommendations at discharge:   Follow-up PCP 5 days  Discharge Diagnoses: Principal Problem:   Near syncope Active Problems:   Shortness of breath   CKD (chronic kidney disease) stage 3, GFR 30-59 ml/min   Elevated d-dimer   Lactic acidosis   Iron deficiency anemia   Obesity, Class III, BMI 40-49.9 (morbid obesity)   GERD (gastroesophageal reflux disease)   Anxiety   Aortic stenosis   Baker's cyst of knee, left    Hospital Course: 72 y.o. female with medical history significant of hypertension, chronic kidney disease stage III, anxiety, GERD and osteoarthritis.  She states that she has been holding her Toprol for the past 2 weeks and she has been having low blood pressures.  She has been feeling dizzy sometimes she feels her heart racing in the morning she is also having trouble sleeping.  She went to the doctor's office this morning was having trouble breathing and had a near syncopal episode where she felt like she was drifting off.  Her blood pressure is usually been high in the past.  CT scan of the chest negative for pulmonary embolism.  Hospitalist services contacted for further evaluation   4/2.  Patient feeling much better.  Offers no complaints.  Not hypoxic during the hospital course.  Patient was not orthostatic.  Patient was hydrated overnight.  Ultrasound of the lower extremity showed no DVT but did have a Baker's cyst left leg.  Patient is asymptomatic with this.  Discussed the possibility of panic attack causing her symptoms.  No further issues while here in the hospital.  Assessment and Plan: * Near syncope Patient was hydrated during the hospital course.  Patient not orthostatic.  Continue to hold Toprol.  A.m. cortisol normal range.  TSH normal range.   Echocardiogram showed mild aortic stenosis with a slightly elevated ejection fraction.  No arrhythmias here.  May end up needing a heart monitor as outpatient.  Shortness of breath CT scan of the chest negative for pulmonary embolism or other cardiopulmonary process.  Chest x-ray also negative.  No recurrence of shortness of breath.  CKD (chronic kidney disease) stage 3, GFR 30-59 ml/min CKD stage IIIb.  Creatinine actually improved to 1.17 with IV fluid hydration.  Elevated d-dimer CT scan of the chest negative.  Ultrasound negative for DVT.  Lactic acidosis Initial lactic acid 2.1 and repeat down to 1.2.  IV fluid hydration.  No signs of infection.  Urinalysis negative.  Chest x-ray and CT scan of the chest negative.  Iron deficiency anemia Follow-up as outpatient  Obesity, Class III, BMI 40-49.9 (morbid obesity) With current height and weight in computer BMI 42.93  GERD (gastroesophageal reflux disease) On as needed PPI  Anxiety On Wellbutrin.  Will put on as needed Xanax, just in case we are dealing with panic attacks.  Aortic stenosis Mild on echocardiogram.  Will need a repeat echocardiogram in the future.  Baker's cyst of knee, left Asymptomatic.  No further workup.         Consultants: None Procedures performed: None Disposition: Home Diet recommendation:  Regular diet DISCHARGE MEDICATION: Allergies as of 03/24/2023       Reactions   Codeine Nausea And Vomiting   Prednisone Palpitations   Sulfa Antibiotics Itching, Rash   Trimethoprim  Hives        Medication List     STOP taking these medications    methocarbamol 500 MG tablet Commonly known as: ROBAXIN   metoprolol succinate 50 MG 24 hr tablet Commonly known as: TOPROL-XL       TAKE these medications    acetaminophen 500 MG tablet Commonly known as: TYLENOL Take 1,000 mg by mouth every 6 (six) hours as needed for moderate pain or headache.   albuterol 108 (90 Base) MCG/ACT  inhaler Commonly known as: VENTOLIN HFA INHALE 2 PUFFS BY MOUTH EVERY 6 HOURS AS NEEDED FOR WHEEZE OR SHORTNESS OF BREATH   ALPRAZolam 0.5 MG tablet Commonly known as: XANAX Take 1 tablet (0.5 mg total) by mouth 3 (three) times daily as needed for anxiety (Panic attacks).   b complex vitamins tablet Take 2 tablets by mouth daily.   buPROPion 300 MG 24 hr tablet Commonly known as: WELLBUTRIN XL TAKE 1 TABLET (300 MG TOTAL) BY MOUTH EVERY MORNING.   Coenzyme Q10 200 MG/GM Powd Take by mouth.   omeprazole 20 MG capsule Commonly known as: PRILOSEC Take 20 mg by mouth daily as needed (heartburn).   VITAMIN D3 PO Take by mouth daily at 2 PM.        Follow-up Information     Arnett, Yvetta Coder, FNP. Schedule an appointment as soon as possible for a visit in 5 day(s).   Specialty: Family Medicine Why: Patient to make own appointment Contact information: [place] [address] [phone]                Discharge Exam: Filed Weights   03/23/23 1124  Weight: 117 kg   Physical Exam HENT:     Head: Normocephalic.     Mouth/Throat:     Pharynx: No oropharyngeal exudate.  Eyes:     General: Lids are normal.     Conjunctiva/sclera: Conjunctivae normal.  Cardiovascular:     Rate and Rhythm: Normal rate and regular rhythm.     Heart sounds: Normal heart sounds, S1 normal and S2 normal.  Pulmonary:     Breath sounds: No decreased breath sounds, wheezing, rhonchi or rales.  Abdominal:     Palpations: Abdomen is soft.     Tenderness: There is no abdominal tenderness.  Musculoskeletal:     Right lower leg: Swelling present.     Left lower leg: Swelling present.  Skin:    General: Skin is warm.     Findings: No rash.  Neurological:     Mental Status: She is alert and oriented to person, place, and time.      Condition at discharge: stable  The results of significant diagnostics from this hospitalization (including imaging,  microbiology, ancillary and laboratory) are listed below for reference.   Imaging Studies: ECHOCARDIOGRAM COMPLETE  Result Date: 03/24/2023    ECHOCARDIOGRAM REPORT   Patient Name:   Julie Rush Date of Exam: 03/24/2023 Medical Rec #:  [id number]        Height:       65.0 in Accession #:    [id number]       Weight:       258.0 lb Date of Birth:  1951-11-26        BSA:          2.204 m Patient Age:    72 years         BP:  129/68 mmHg Patient Gender: F                HR:           98 bpm. Exam Location:  [facility] Procedure: 2D Echo, Cardiac Doppler and Color Doppler Indications:     Murmur, Syncope  History:         Patient has no prior history of Echocardiogram examinations.                  Signs/Symptoms:Murmur, Syncope, Chest Pain and Shortness of                  Breath; Risk Factors:Hypertension and Dyslipidemia.  Sonographer:     Wenda Low Referring Phys:  [id number] Loletha Grayer Diagnosing Phys: Kathlyn Sacramento MD  Sonographer Comments: Patient is obese. IMPRESSIONS  1. Left ventricular ejection fraction, by estimation, is 65 to 70%. The left ventricle has normal function. The left ventricle has no regional wall motion abnormalities. There is mild left ventricular hypertrophy. Left ventricular diastolic parameters are indeterminate.  2. Right ventricular systolic function is normal. The right ventricular size is normal. There is normal pulmonary artery systolic pressure.  3. The mitral valve is normal in structure. Trivial mitral valve regurgitation. No evidence of mitral stenosis.  4. The aortic valve is normal in structure. Aortic valve regurgitation is not visualized. Mild aortic valve stenosis. Aortic valve area, by VTI measures 2.23 cm. Aortic valve mean gradient measures 8.0 mmHg. FINDINGS  Left Ventricle: Left ventricular ejection fraction, by estimation, is 65 to 70%. The left ventricle has normal function. The left ventricle has no regional wall motion abnormalities. The left  ventricular internal cavity size was normal in size. There is  mild left ventricular hypertrophy. Left ventricular diastolic parameters are indeterminate. Right Ventricle: The right ventricular size is normal. No increase in right ventricular wall thickness. Right ventricular systolic function is normal. There is normal pulmonary artery systolic pressure. The tricuspid regurgitant velocity is 2.57 m/s, and  with an assumed right atrial pressure of 3 mmHg, the estimated right ventricular systolic pressure is [id number]. Left Atrium: Left atrial size was normal in size. Right Atrium: Right atrial size was normal in size. Pericardium: There is no evidence of pericardial effusion. Mitral Valve: The mitral valve is normal in structure. Mild mitral annular calcification. Trivial mitral valve regurgitation. No evidence of mitral valve stenosis. MV peak gradient, 11.3 mmHg. The mean mitral valve gradient is 5.5 mmHg. Tricuspid Valve: The tricuspid valve is normal in structure. Tricuspid valve regurgitation is trivial. No evidence of tricuspid stenosis. Aortic Valve: The aortic valve is normal in structure. Aortic valve regurgitation is not visualized. Mild aortic stenosis is present. Aortic valve mean gradient measures 8.0 mmHg. Aortic valve peak gradient measures 18.1 mmHg. Aortic valve area, by VTI measures 2.23 cm. Pulmonic Valve: The pulmonic valve was normal in structure. Pulmonic valve regurgitation is not visualized. No evidence of pulmonic stenosis. Aorta: The aortic root is normal in size and structure. Venous: The inferior vena cava was not well visualized. IAS/Shunts: No atrial level shunt detected by color flow Doppler.  LEFT VENTRICLE PLAX 2D LVIDd:         4.50 cm   Diastology LVIDs:         2.50 cm   LV e' lateral:   12.40 cm/s LV PW:         1.20 cm   LV E/e' lateral: 13.1 LV IVS:  1.20 cm LVOT diam:     2.00 cm LV SV:         86 LV SV Index:   39 LVOT Area:     3.14 cm  RIGHT VENTRICLE RV Basal diam:   3.50 cm RV Mid diam:    2.70 cm RV S prime:     14.30 cm/s TAPSE (M-mode): 2.7 cm LEFT ATRIUM             Index        RIGHT ATRIUM           Index LA diam:        4.00 cm 1.81 cm/m   RA Area:     13.40 cm LA Vol (A2C):   47.0 ml 21.32 ml/m  RA Volume:   31.00 ml  14.06 ml/m LA Vol (A4C):   36.0 ml 16.33 ml/m LA Biplane Vol: 42.3 ml 19.19 ml/m  AORTIC VALVE                     PULMONIC VALVE AV Area (Vmax):    2.05 cm      PV Vmax:       1.54 m/s AV Area (Vmean):   2.15 cm      PV Peak grad:  9.5 mmHg AV Area (VTI):     2.23 cm AV Vmax:           213.00 cm/s AV Vmean:          125.500 cm/s AV VTI:            0.386 m AV Peak Grad:      18.1 mmHg AV Mean Grad:      8.0 mmHg LVOT Vmax:         139.00 cm/s LVOT Vmean:        85.700 cm/s LVOT VTI:          0.274 m LVOT/AV VTI ratio: 0.71  AORTA Ao Root diam: 3.00 cm MITRAL VALVE                TRICUSPID VALVE MV Area (PHT): 4.01 cm     TR Peak grad:   26.4 mmHg MV Area VTI:   2.52 cm     TR Vmax:        257.00 cm/s MV Peak grad:  11.3 mmHg MV Mean grad:  5.5 mmHg     SHUNTS MV Vmax:       1.68 m/s     Systemic VTI:  0.27 m MV Vmean:      106.0 cm/s   Systemic Diam: 2.00 cm MV Decel Time: 189 msec MV E velocity: 162.00 cm/s Kathlyn Sacramento MD Electronically signed by Kathlyn Sacramento MD Signature Date/Time: 03/24/2023/1:34:31 PM    Final    US Venous Img Lower Bilateral (DVT)  Result Date: 03/23/2023 CLINICAL DATA:  Bilateral lower extremity pain and edema. Elevated D-dimer. Evaluate for DVT. EXAM: BILATERAL LOWER EXTREMITY VENOUS DOPPLER ULTRASOUND TECHNIQUE: Gray-scale sonography with graded compression, as well as color Doppler and duplex ultrasound were performed to evaluate the lower extremity deep venous systems from the level of the common femoral vein and including the common femoral, femoral, profunda femoral, popliteal and calf veins including the posterior tibial, peroneal and gastrocnemius veins when visible. The superficial great saphenous vein was  also interrogated. Spectral Doppler was utilized to evaluate flow at rest and with distal augmentation maneuvers in the common femoral, femoral and popliteal veins. COMPARISON:  None  Available. FINDINGS: RIGHT LOWER EXTREMITY Common Femoral Vein: No evidence of thrombus. Normal compressibility, respiratory phasicity and response to augmentation. Saphenofemoral Junction: No evidence of thrombus. Normal compressibility and flow on color Doppler imaging. Profunda Femoral Vein: No evidence of thrombus. Normal compressibility and flow on color Doppler imaging. Femoral Vein: No evidence of thrombus. Normal compressibility, respiratory phasicity and response to augmentation. Popliteal Vein: No evidence of thrombus. Normal compressibility, respiratory phasicity and response to augmentation. Calf Veins: No evidence of thrombus. Normal compressibility and flow on color Doppler imaging. Superficial Great Saphenous Vein: No evidence of thrombus. Normal compressibility. Other Findings:  None. LEFT LOWER EXTREMITY Common Femoral Vein: No evidence of thrombus. Normal compressibility, respiratory phasicity and response to augmentation. Saphenofemoral Junction: No evidence of thrombus. Normal compressibility and flow on color Doppler imaging. Profunda Femoral Vein: No evidence of thrombus. Normal compressibility and flow on color Doppler imaging. Femoral Vein: No evidence of thrombus. Normal compressibility, respiratory phasicity and response to augmentation. Popliteal Vein: No evidence of thrombus. Normal compressibility, respiratory phasicity and response to augmentation. Calf Veins: No evidence of thrombus. Normal compressibility and flow on color Doppler imaging. Superficial Great Saphenous Vein: No evidence of thrombus. Normal compressibility. Other Findings: Note is made of an approximately 4.7 x 4.2 x 1.8 cm fluid collection with the left popliteal fossa compatible with a Baker's cyst (images 41 and 42) IMPRESSION: 1. No  evidence of DVT within either lower extremity. 2. Incidentally noted approximately 4.7 cm left-sided Baker's cyst. Electronically Signed   By: Sandi Mariscal M.D.   On: 03/23/2023 16:39   CT Angio Chest PE W and/or Wo Contrast  Result Date: 03/23/2023 CLINICAL DATA:  Syncope EXAM: CT ANGIOGRAPHY CHEST WITH CONTRAST TECHNIQUE: Multidetector CT imaging of the chest was performed using the standard protocol during bolus administration of intravenous contrast. Multiplanar CT image reconstructions and MIPs were obtained to evaluate the vascular anatomy. RADIATION DOSE REDUCTION: This exam was performed according to the departmental dose-optimization program which includes automated exposure control, adjustment of the mA and/or kV according to patient size and/or use of iterative reconstruction technique. CONTRAST:  64mL OMNIPAQUE IOHEXOL 350 MG/ML SOLN COMPARISON:  12/15/2005 FINDINGS: Cardiovascular: Satisfactory opacification of the pulmonary arteries to the segmental level. No evidence of pulmonary embolism. Normal heart size. No pericardial effusion. Atheromatous calcifications of the aorta and coronary arteries. Mediastinum/Nodes: No enlarged mediastinal, hilar, or axillary lymph nodes. Thyroid gland, trachea, and esophagus demonstrate no significant findings. Lungs/Pleura: Dependent basilar subsegmental atelectasis. No pleural or pericardial effusion. Upper Abdomen: Numerous bilateral renal cysts, partially imaged the largest measuring 2 cm. Musculoskeletal: Thoracic degenerative changes. No acute osseous abnormalities. Review of the MIP images confirms the above findings. IMPRESSION: No acute cardiopulmonary process identified. Electronically Signed   By: Sammie Bench M.D.   On: 03/23/2023 14:59   DG Chest Portable 1 View  Result Date: 03/23/2023 CLINICAL DATA:  Shortness of breath EXAM: PORTABLE CHEST 1 VIEW COMPARISON:  12/08/2022 x-ray and older FINDINGS: No consolidation, pneumothorax or effusion. No  edema. Overlapping cardiac leads. Calcified and tortuous aorta. Normal cardiopericardial silhouette. IMPRESSION: No acute cardiopulmonary disease Electronically Signed   By: Jill Side M.D.   On: 03/23/2023 12:05    Microbiology: Results for orders placed or performed during the hospital encounter of 12/08/22  Resp Panel by RT-PCR (Flu A&B, Covid) Anterior Nasal Swab     Status: None   Collection Time: 12/08/22 12:32 PM   Specimen: Anterior Nasal Swab  Result Value Ref Range Status   SARS Coronavirus  2 by RT PCR NEGATIVE NEGATIVE Final    Comment: (NOTE) SARS-CoV-2 target nucleic acids are NOT DETECTED.  The SARS-CoV-2 RNA is generally detectable in upper respiratory specimens during the acute phase of infection. The lowest concentration of SARS-CoV-2 viral copies this assay can detect is 138 copies/mL. A negative result does not preclude SARS-Cov-2 infection and should not be used as the sole basis for treatment or other patient management decisions. A negative result may occur with  improper specimen collection/handling, submission of specimen other than nasopharyngeal swab, presence of viral mutation(s) within the areas targeted by this assay, and inadequate number of viral copies(<138 copies/mL). A negative result must be combined with clinical observations, patient history, and epidemiological information. The expected result is Negative.  Fact Sheet for Patients:  EntrepreneurPulse.com.au  Fact Sheet for Healthcare Providers:  IncredibleEmployment.be  This test is no t yet approved or cleared by the [place] FDA and  has been authorized for detection and/or diagnosis of SARS-CoV-2 by FDA under an Emergency Use Authorization (EUA). This EUA will remain  in effect (meaning this test can be used) for the duration of the COVID-19 declaration under Section 564(b)(1) of the Act, 21 U.S.C.section 360bbb-3(b)(1), unless the authorization is  terminated  or revoked sooner.       Influenza A by PCR NEGATIVE NEGATIVE Final   Influenza B by PCR NEGATIVE NEGATIVE Final    Comment: (NOTE) The Xpert Xpress SARS-CoV-2/FLU/RSV plus assay is intended as an aid in the diagnosis of influenza from Nasopharyngeal swab specimens and should not be used as a sole basis for treatment. Nasal washings and aspirates are unacceptable for Xpert Xpress SARS-CoV-2/FLU/RSV testing.  Fact Sheet for Patients: EntrepreneurPulse.com.au  Fact Sheet for Healthcare Providers: IncredibleEmployment.be  This test is not yet approved or cleared by the [place] FDA and has been authorized for detection and/or diagnosis of SARS-CoV-2 by FDA under an Emergency Use Authorization (EUA). This EUA will remain in effect (meaning this test can be used) for the duration of the COVID-19 declaration under Section 564(b)(1) of the Act, 21 U.S.C. section 360bbb-3(b)(1), unless the authorization is terminated or revoked.  Performed at [facility], [place] [address], [place] [address]     Labs: CBC: Recent Labs  Lab 03/23/23 1141  WBC 8.4  NEUTROABS 5.4  HGB 11.5*  HCT 35.8*  MCV 96.2  PLT [id number]   Basic Metabolic Panel: Recent Labs  Lab 03/23/23 1141 03/24/23 0557  NA 138 136  K 4.0 3.9  CL 105 106  CO2 23 23  GLUCOSE 119* 94  BUN 25* 19  CREATININE 1.28* 1.17*  CALCIUM 9.7 8.5*   Liver Function Tests: Recent Labs  Lab 03/23/23 1141  AST 28  ALT 19  ALKPHOS 53  BILITOT 0.7  PROT 7.0  ALBUMIN 3.8   CBG: No results for input(s): "GLUCAP" in the last 168 hours.  Discharge time spent: greater than 30 minutes.  Signed: Loletha Grayer, MD [facility] 03/24/2023

## 2023-03-25 ENCOUNTER — Ambulatory Visit: Payer: Medicare Other | Admitting: Family

## 2023-04-03 ENCOUNTER — Ambulatory Visit: Payer: Medicare Other | Attending: Family

## 2023-04-03 ENCOUNTER — Ambulatory Visit (INDEPENDENT_AMBULATORY_CARE_PROVIDER_SITE_OTHER): Payer: Medicare Other | Admitting: Family

## 2023-04-03 ENCOUNTER — Encounter: Payer: Self-pay | Admitting: Family

## 2023-04-03 VITALS — BP 132/78 | HR 71 | Temp 97.8°F | Ht 64.0 in | Wt 254.0 lb

## 2023-04-03 DIAGNOSIS — G250 Essential tremor: Secondary | ICD-10-CM

## 2023-04-03 DIAGNOSIS — N281 Cyst of kidney, acquired: Secondary | ICD-10-CM | POA: Diagnosis not present

## 2023-04-03 DIAGNOSIS — R42 Dizziness and giddiness: Secondary | ICD-10-CM

## 2023-04-03 DIAGNOSIS — D649 Anemia, unspecified: Secondary | ICD-10-CM | POA: Diagnosis not present

## 2023-04-03 DIAGNOSIS — I700 Atherosclerosis of aorta: Secondary | ICD-10-CM | POA: Insufficient documentation

## 2023-04-03 LAB — IBC + FERRITIN
Ferritin: 52.5 ng/mL (ref 10.0–291.0)
Iron: 95 ug/dL (ref 42–145)
Saturation Ratios: 31 % (ref 20.0–50.0)
TIBC: 306.6 ug/dL (ref 250.0–450.0)
Transferrin: 219 mg/dL (ref 212.0–360.0)

## 2023-04-03 LAB — B12 AND FOLATE PANEL
Folate: 22 ng/mL (ref >5.9)
Vitamin B-12: 469 pg/mL (ref 211–911)

## 2023-04-03 NOTE — Assessment & Plan Note (Signed)
Strongly advised consideration for statin therapy with known atherosclerosis and early family history of heart disease.  Patient would like to consider this.  Referral to cardiology for further evaluation, risk stratification.

## 2023-04-03 NOTE — Patient Instructions (Addendum)
I have placed a referral to cardiology and physical therapy.  Let us know if you dont hear back within a week in regards to an appointment being scheduled.   So that you are aware, if you are [facility] MyChart user , please pay attention to your MyChart messages as you may receive a MyChart message with a phone number to call and schedule this test/appointment own your own from our referral coordinator. This is a new process so I do not want you to miss this message.  If you are not a MyChart user, you will receive a phone call.    I have ordered a 14 day Zio monitor which will mailed directly to you. The device will include instructions on how to apply.   The Zio 14 day monitor that we use DOES NOT have 24 hour live monitoring. The rhythm of your heart will not be monitored while you are wearing it. Only AFTER you mail the device back in and a cardiologist interprets the data will we know if an underlying cardiac arrhythmia is going on.   There are models that offer 24 hour live monitoring however NOT this one. I wanted to be sure that you were aware of this as certainly didn't want this to be a false sense of security.   If you experience chest pain, shortness of breath, left arm numbness, or sustained, more frequent palpitations , do not wait and call 911 right away. We are in a delicate period of work up regarding palpitations and until we are sure that you do not have an underlying arrhythmia, you must be extremely vigilant and cautious.   Dizziness Dizziness is a common problem. It is a feeling of unsteadiness or light-headedness. You may feel like you are about to faint. Dizziness can lead to injury if you stumble or fall. Anyone can become dizzy, but dizziness is more common in older adults. This condition can be caused by a number of things, including medicines, dehydration, or illness. Follow these instructions at home: Eating and drinking  Drink enough fluid to keep your urine pale yellow.  This helps to keep you from becoming dehydrated. Try to drink more clear fluids, such as water. Do not drink alcohol. Limit your caffeine intake if told to do so by your health care provider. Check ingredients and nutrition facts to see if a food or beverage contains caffeine. Limit your salt (sodium) intake if told to do so by your health care provider. Check ingredients and nutrition facts to see if a food or beverage contains sodium. Activity  Avoid making quick movements. Rise slowly from chairs and steady yourself until you feel okay. In the morning, first sit up on the side of the bed. When you feel okay, stand slowly while you hold onto something until you know that your balance is good. If you need to stand in one place for a long time, move your legs often. Tighten and relax the muscles in your legs while you are standing. Do not drive or use machinery if you feel dizzy. Avoid bending down if you feel dizzy. Place items in your home so that they are easy for you to reach without leaning over. Lifestyle Do not use any products that contain nicotine or tobacco. These products include cigarettes, chewing tobacco, and vaping devices, such as e-cigarettes. If you need help quitting, ask your health care provider. Try to reduce your stress level by using methods such as yoga or meditation. Talk with your  health care provider if you need help to manage your stress. General instructions Watch your dizziness for any changes. Take over-the-counter and prescription medicines only as told by your health care provider. Talk with your health care provider if you think that your dizziness is caused by a medicine that you are taking. Tell a friend or a family member that you are feeling dizzy. If he or she notices any changes in your behavior, have this person call your health care provider. Keep all follow-up visits. This is important. Contact a health care provider if: Your dizziness does not go away  or you have new symptoms. Your dizziness or light-headedness gets worse. You feel nauseous. You have reduced hearing. You have a fever. You have neck pain or a stiff neck. Your dizziness leads to an injury or a fall. Get help right away if: You vomit or have diarrhea and are unable to eat or drink anything. You have problems talking, walking, swallowing, or using your arms, hands, or legs. You feel generally weak. You have any bleeding. You are not thinking clearly or you have trouble forming sentences. It may take a friend or family member to notice this. You have chest pain, abdominal pain, shortness of breath, or sweating. Your vision changes or you develop a severe headache. These symptoms may represent a serious problem that is an emergency. Do not wait to see if the symptoms will go away. Get medical help right away. Call your local emergency services (911 in the [place] Do not drive yourself to the hospital. Summary Dizziness is a feeling of unsteadiness or light-headedness. This condition can be caused by a number of things, including medicines, dehydration, or illness. Anyone can become dizzy, but dizziness is more common in older adults. Drink enough fluid to keep your urine pale yellow. Do not drink alcohol. Avoid making quick movements if you feel dizzy. Monitor your dizziness for any changes. This information is not intended to replace advice given to you by your health care provider. Make sure you discuss any questions you have with your health care provider. Document Revised: 11/12/2020 Document Reviewed: 11/12/2020 Elsevier Patient Education  2023 [employer] Palpitations Palpitations are feelings that your heartbeat is irregular or is faster than normal. It may feel like your heart is fluttering or skipping a beat. Palpitations may be caused by many things, including smoking, caffeine, alcohol, stress, and certain medicines or drugs. Most causes of palpitations are not  serious.  However, some palpitations can be a sign of a serious problem. Further tests and a thorough medical history will be done to find the cause of your palpitations. Your provider may order tests such as an ECG, labs, an echocardiogram, or an ambulatory continuous ECG monitor. Follow these instructions at home: Pay attention to any changes in your symptoms. Let your health care provider know about them. Take these actions to help manage your symptoms: Eating and drinking Follow instructions from your health care provider about eating or drinking restrictions. You may need to avoid foods and drinks that may cause palpitations. These may include: Caffeinated coffee, tea, soft drinks, and energy drinks. Chocolate. Alcohol. Diet pills. Lifestyle     Take steps to reduce your stress and anxiety. Things that can help you relax include: Yoga. Mind-body activities, such as deep breathing, meditation, or using words and images to create positive thoughts (guided imagery). Physical activity, such as swimming, jogging, or walking. Tell your health care provider if your palpitations increase with activity. If you  have chest pain or shortness of breath with activity, do not continue the activity until you are seen by your health care provider. Biofeedback. This is a method that helps you learn to use your mind to control things in your body, such as your heartbeat. Get plenty of rest and sleep. Keep a regular bed time. Do not use drugs, including cocaine or ecstasy. Do not use marijuana. Do not use any products that contain nicotine or tobacco. These products include cigarettes, chewing tobacco, and vaping devices, such as e-cigarettes. If you need help quitting, ask your health care provider. General instructions Take over-the-counter and prescription medicines only as told by your health care provider. Keep all follow-up visits. This is important. These may include visits for further testing if  palpitations do not go away or get worse. Contact a health care provider if: You continue to have a fast or irregular heartbeat for a long period of time. You notice that your palpitations occur more often. Get help right away if: You have chest pain or shortness of breath. You have a severe headache. You feel dizzy or you faint. These symptoms may represent a serious problem that is an emergency. Do not wait to see if the symptoms will go away. Get medical help right away. Call your local emergency services (911 in the [place] Do not drive yourself to the hospital. Summary Palpitations are feelings that your heartbeat is irregular or is faster than normal. It may feel like your heart is fluttering or skipping a beat. Palpitations may be caused by many things, including smoking, caffeine, alcohol, stress, certain medicines, and drugs. Further tests and a thorough medical history may be done to find the cause of your palpitations. Get help right away if you faint or have chest pain, shortness of breath, severe headache, or dizziness. This information is not intended to replace advice given to you by your health care provider. Make sure you discuss any questions you have with your health care provider. Document Revised: 05/01/2021 Document Reviewed: 05/01/2021 Elsevier Patient Education  2023 [employer].

## 2023-04-03 NOTE — Progress Notes (Signed)
Assessment & Plan:  Dizziness Assessment & Plan: Reviewed hospital course.  Medications reconciled.  I have ordered ultrasound carotid and 14 ZIO monitor.  Counseled patient extensively if she were experience another episode as she did in the office with trouble breathing, dizziness to call EMS immediately.  She verbalized understanding of remaining hypervigilant during this evaluation.   Orders: -     US Carotid Bilateral; Future -     LONG TERM MONITOR (3-14 DAYS); Future -     Ambulatory referral to Cardiology -     B12 and Folate Panel -     Ambulatory referral to Physical Therapy -     IBC + Ferritin  Atherosclerosis of aorta Assessment & Plan: Strongly advised consideration for statin therapy with known atherosclerosis and early family history of heart disease.  Patient would like to consider this.  Referral to cardiology for further evaluation, risk stratification.  Orders: -     IBC + Ferritin  Renal cyst Assessment & Plan: CTA 03/23/2023 Numerous bilateral renal cysts, partially imaged the largest measuring 2 cm.   10/07/22 US renal, urology Dr Aleene Davidson  Right Kidney:   Renal measurements: 9.9 x 4.2 x 3.9 cm = volume: 85 mL. Contains a 1.4 cm cyst. No follow-up imaging recommended for the cyst.   Left Kidney:  Renal measurements: 10.2 x 5.5 x 3.3 cm = volume: 97 mL. Two cysts are identified in the left kidney with the largest measuring 2.6 cm. No follow-up imaging recommended for the cysts.   Discussed known renal cyst with patient today.  Per ultrasound and follow-up imaging recommended.  She is following  Dr Richardo Hanks 10/20/23 and I will defer any further surveillance to Dr Richardo Hanks.    Orders: -     IBC + Ferritin  Anemia, unspecified type -     IBC + Ferritin  Essential tremor Assessment & Plan: Able to appreciate fine tremor of the chin on exam today.  Presentation most consistent with essential tremor. otherwise, neurologic exam is reassuring.  We  discussed extensively family history of Parkinson's disease.  Dizziness and tremor may be subtle symptoms to suggest Parkinsonism however she didn't exhibit other symptoms.  Husband, patient and I on jointly agreed to focus on cardiac findings/ work up with patient at this time and continue to discuss neurologic symptoms.  She politely declines MRI of the brain, neurology referral at this time.  Due to mobility concerns, extremity weakness, referral to physical therapy      Return precautions given.   Risks, benefits, and alternatives of the medications and treatment plan prescribed today were discussed, and patient expressed understanding.   Education regarding symptom management and diagnosis given to patient on AVS either electronically or printed.  No follow-ups on file.  Rennie Plowman, FNP  Subjective:    Patient ID: Julie Rush, female    DOB: 1951/09/05, 72 y.o.   MRN: [id number]  CC: Julie Rush is a 72 y.o. female who presents today for follow up.   HPI: Accompanied by husband  Occassaional dizziness when she goes to stand. She feels occasional heart palpitations with activity.  No regular exercise.  No associated cp, syncope, HA, vision changes.   She is feeling 'weak'.  She uses her arms primarily to get out of the chair.  She is using a cane now to "steady herself".  Chronic bilateral knee pain   She has also noticed over the past several months a tremor in her chin.  Her sister had Parkinson's disease.  No lower limb tremor.     Follow-up hospitalization admitted 03/23/2023 and discharged the following day.  Admitted for shortness of breath, near syncope. She is no longer on toprol.  CTA negative for PE.  Normal heart size.  No pericardial effusion.  Calcifications of aorta and coronary arteries.  Numerous bilateral renal cysts.  Thoracic degenerative changes.  Ultrasound negative for DVT D dimer elevated 0.96 Troponin 8 BNP 30.9 Crt 1.28 to 1.17  Am  cortisol 9.3 TSH 1.663 Lactic acid 2.1 to 1.2  Aortic stenosis mild echocardiogram.  Ejection fraction 65 to 70%.  Mild left ventricular hypertrophy  Allergies: Codeine, Prednisone, Sulfa antibiotics, and Trimethoprim Current Outpatient Medications on File Prior to Visit  Medication Sig Dispense Refill   acetaminophen (TYLENOL) 500 MG tablet Take 1,000 mg by mouth every 6 (six) hours as needed for moderate pain or headache.      albuterol (VENTOLIN HFA) 108 (90 Base) MCG/ACT inhaler INHALE 2 PUFFS BY MOUTH EVERY 6 HOURS AS NEEDED FOR WHEEZE OR SHORTNESS OF BREATH 6.7 each 1   b complex vitamins tablet Take 2 tablets by mouth daily.     buPROPion (WELLBUTRIN XL) 300 MG 24 hr tablet TAKE 1 TABLET (300 MG TOTAL) BY MOUTH EVERY MORNING. 90 tablet 1   Cholecalciferol (VITAMIN D3 PO) Take by mouth daily at 2 PM.     Coenzyme Q10 200 MG/GM POWD Take by mouth.     omeprazole (PRILOSEC) 20 MG capsule Take 20 mg by mouth daily as needed (heartburn).      No current facility-administered medications on file prior to visit.    Review of Systems  Constitutional:  Negative for chills and fever.  Respiratory:  Negative for cough.   Cardiovascular:  Negative for chest pain and palpitations.  Gastrointestinal:  Negative for nausea and vomiting.  Neurological:  Positive for dizziness, tremors and weakness.      Objective:    BP 132/78   Pulse 71   Temp 97.8 F (36.6 C) (Oral)   Ht  (1.626 m)   Wt 254 lb (115.2 kg)   SpO2 97%   BMI 43.60 kg/m  BP Readings from Last 3 Encounters:  04/03/23 132/78  03/24/23 139/64  03/23/23 118/78   Wt Readings from Last 3 Encounters:  04/03/23 254 lb (115.2 kg)  03/23/23 258 lb (117 kg)  03/23/23 253 lb (114.8 kg)    Physical Exam Vitals reviewed.  Constitutional:      Appearance: She is well-developed.  HENT:     Mouth/Throat:     Pharynx: Uvula midline.  Eyes:     Conjunctiva/sclera: Conjunctivae normal.     Pupils: Pupils are equal,  round, and reactive to light.     Comments: Fundus normal bilaterally.   Neck:     Comments: Very fine tremor of chin noticed while talking.  No neck tremor appreciated. Cardiovascular:     Rate and Rhythm: Normal rate and regular rhythm.     Pulses: Normal pulses.     Heart sounds: Normal heart sounds.  Pulmonary:     Effort: Pulmonary effort is normal.     Breath sounds: Normal breath sounds. No wheezing, rhonchi or rales.  Skin:    General: Skin is warm and dry.  Neurological:     Mental Status: She is alert.     Cranial Nerves: No cranial nerve deficit.     Sensory: No sensory deficit.     Deep Tendon  Reflexes:     Reflex Scores:      Bicep reflexes are 2+ on the right side and 2+ on the left side.      Patellar reflexes are 2+ on the right side and 2+ on the left side.    Comments: Grip equal strength. 4/5 strength upper and lower extremities bilaterally.  Patient uses her arms completely to get out of the chair. Gait is slightly wide-based.  She is not ataxic.  No shuffling gait.  No cogwheeling.  Able to perform rapid alternating movement without difficulty.   Psychiatric:        Speech: Speech normal.        Behavior: Behavior normal.        Thought Content: Thought content normal.

## 2023-04-03 NOTE — Assessment & Plan Note (Addendum)
Able to appreciate fine tremor of the chin on exam today.  Presentation most consistent with essential tremor. otherwise, neurologic exam is reassuring.  We discussed extensively family history of Parkinson's disease.  Dizziness and tremor may be subtle symptoms to suggest Parkinsonism however she didn't exhibit other symptoms.  Husband, patient and I on jointly agreed to focus on cardiac findings/ work up with patient at this time and continue to discuss neurologic symptoms.  She politely declines MRI of the brain, neurology referral at this time.  Due to mobility concerns, extremity weakness, referral to physical therapy

## 2023-04-03 NOTE — Assessment & Plan Note (Addendum)
CTA 03/23/2023 Numerous bilateral renal cysts, partially imaged the largest measuring 2 cm.   10/07/22 US renal, urology Dr Aleene Davidson  Right Kidney:   Renal measurements: 9.9 x 4.2 x 3.9 cm = volume: 85 mL. Contains a 1.4 cm cyst. No follow-up imaging recommended for the cyst.   Left Kidney:  Renal measurements: 10.2 x 5.5 x 3.3 cm = volume: 97 mL. Two cysts are identified in the left kidney with the largest measuring 2.6 cm. No follow-up imaging recommended for the cysts.   Discussed known renal cyst with patient today.  Per ultrasound and follow-up imaging recommended.  She is following  Dr Richardo Hanks 10/20/23 and I will defer any further surveillance to Dr Richardo Hanks.

## 2023-04-03 NOTE — Assessment & Plan Note (Signed)
Reviewed hospital course.  Medications reconciled.  I have ordered ultrasound carotid and 14 ZIO monitor.  Counseled patient extensively if she were experience another episode as she did in the office with trouble breathing, dizziness to call EMS immediately.  She verbalized understanding of remaining hypervigilant during this evaluation.

## 2023-04-04 ENCOUNTER — Other Ambulatory Visit: Payer: Self-pay | Admitting: Family

## 2023-04-04 DIAGNOSIS — D649 Anemia, unspecified: Secondary | ICD-10-CM

## 2023-04-07 DIAGNOSIS — N731 Chronic parametritis and pelvic cellulitis: Secondary | ICD-10-CM | POA: Diagnosis not present

## 2023-04-07 DIAGNOSIS — N764 Abscess of vulva: Secondary | ICD-10-CM | POA: Diagnosis not present

## 2023-04-08 ENCOUNTER — Encounter: Payer: Self-pay | Admitting: Family

## 2023-04-08 DIAGNOSIS — R42 Dizziness and giddiness: Secondary | ICD-10-CM

## 2023-04-10 ENCOUNTER — Other Ambulatory Visit: Payer: Self-pay

## 2023-04-10 DIAGNOSIS — M21961 Unspecified acquired deformity of right lower leg: Secondary | ICD-10-CM

## 2023-04-21 ENCOUNTER — Other Ambulatory Visit: Payer: Self-pay | Admitting: Certified Nurse Midwife

## 2023-04-21 DIAGNOSIS — N731 Chronic parametritis and pelvic cellulitis: Secondary | ICD-10-CM

## 2023-04-22 ENCOUNTER — Ambulatory Visit
Admission: RE | Admit: 2023-04-22 | Discharge: 2023-04-22 | Disposition: A | Discharge status: Home / Self Care | Payer: Medicare Other | Source: Ambulatory Visit | Attending: Certified Nurse Midwife | Admitting: Certified Nurse Midwife

## 2023-04-22 DIAGNOSIS — N731 Chronic parametritis and pelvic cellulitis: Secondary | ICD-10-CM

## 2023-04-22 DIAGNOSIS — K5730 Diverticulosis of large intestine without perforation or abscess without bleeding: Secondary | ICD-10-CM | POA: Diagnosis not present

## 2023-04-22 MED ORDER — IOHEXOL 350 MG/ML SOLN
85.0000 mL | Freq: Once | INTRAVENOUS | Status: AC | PRN
  Administered 2023-04-22: 85 mL via INTRAVENOUS

## 2023-04-22 MED ORDER — IOHEXOL 9 MG/ML PO SOLN
500.0000 mL | ORAL | Status: AC
  Administered 2023-04-22 (×2): 500 mL via ORAL

## 2023-04-23 ENCOUNTER — Ambulatory Visit
Admission: RE | Admit: 2023-04-23 | Discharge: 2023-04-23 | Disposition: A | Discharge status: Home / Self Care | Payer: Medicare Other | Source: Ambulatory Visit | Attending: Family | Admitting: Family

## 2023-04-23 DIAGNOSIS — R42 Dizziness and giddiness: Secondary | ICD-10-CM | POA: Diagnosis not present

## 2023-04-23 DIAGNOSIS — I6523 Occlusion and stenosis of bilateral carotid arteries: Secondary | ICD-10-CM | POA: Diagnosis not present

## 2023-04-23 DIAGNOSIS — I779 Disorder of arteries and arterioles, unspecified: Secondary | ICD-10-CM

## 2023-04-23 HISTORY — DX: Disorder of arteries and arterioles, unspecified: I77.9

## 2023-04-30 ENCOUNTER — Encounter: Payer: Self-pay | Admitting: Family

## 2023-04-30 ENCOUNTER — Other Ambulatory Visit: Payer: Self-pay | Admitting: Family

## 2023-04-30 ENCOUNTER — Telehealth: Payer: Self-pay | Admitting: Family

## 2023-04-30 DIAGNOSIS — R9389 Abnormal findings on diagnostic imaging of other specified body structures: Secondary | ICD-10-CM

## 2023-04-30 NOTE — Telephone Encounter (Signed)
noted 

## 2023-04-30 NOTE — Telephone Encounter (Signed)
Spoke to pt went over results. Pt verbalized understanding and she did receive Zio monitor pt stated that she had just sent back maybe a week ago.

## 2023-04-30 NOTE — Telephone Encounter (Signed)
Julie Rush,  I have ordered stat cta head and cta neck due to abnormal carotid ultrasound  Patient has kidney disease so I am confirming with nephrology okay to have contrast. Please let me know once scheduled so I can ensure safe to have contrast per nephrology  Julie Rush pool Please call pt  1) carotid ultrasound reveal possible higher resistance on left side. I have ordered CT angio head and neck with contrast to evaluate. I have sent a message to Dr Cherylann Ratel to ensure okay for you to have contrast due to kidney disease and I will let you know.  2) I do not have results of Zio monitor which I ordered on 04/03/23. Did she get Zio monitor? Did she mail back? Please let Olegario Messier know if pt didn't receive so she can contact company asap

## 2023-05-01 ENCOUNTER — Ambulatory Visit: Payer: Medicare Other

## 2023-05-01 ENCOUNTER — Other Ambulatory Visit
Admission: RE | Admit: 2023-05-01 | Discharge: 2023-05-01 | Disposition: A | Discharge status: Home / Self Care | Payer: Medicare Other | Source: Ambulatory Visit | Attending: Family | Admitting: Family

## 2023-05-01 ENCOUNTER — Other Ambulatory Visit: Payer: Self-pay | Admitting: Family

## 2023-05-01 DIAGNOSIS — N183 Chronic kidney disease, stage 3 unspecified: Secondary | ICD-10-CM | POA: Insufficient documentation

## 2023-05-01 LAB — BASIC METABOLIC PANEL
Anion gap: 8 (ref 5–15)
BUN: 19 mg/dL (ref 8–23)
CO2: 26 mmol/L (ref 22–32)
Calcium: 9.6 mg/dL (ref 8.9–10.3)
Chloride: 102 mmol/L (ref 98–111)
Creatinine, Ser: 1.35 mg/dL — ABNORMAL HIGH (ref 0.44–1.00)
GFR, Estimated: 41 mL/min — ABNORMAL LOW (ref >60)
Glucose, Bld: 109 mg/dL — ABNORMAL HIGH (ref 70–99)
Potassium: 4.6 mmol/L (ref 3.5–5.1)
Sodium: 136 mmol/L (ref 135–145)

## 2023-05-01 NOTE — Telephone Encounter (Signed)
Julie Rush, patient sent ame a MyChart message in that she had a recent image with IV contrast. We jointly agreed to pause CTA head and CTA neck as scheduled this morning and have renal function rechecked.     If stable, we will reschedule for early next week.  I will let you know once I get labs back.

## 2023-05-01 NOTE — Telephone Encounter (Signed)
Spoke to pt and informed her that appt was cancelled and that she needed to go to [facility] to get bloodwork done.pt verbalized understanding

## 2023-05-01 NOTE — Progress Notes (Signed)
close

## 2023-05-04 ENCOUNTER — Other Ambulatory Visit: Payer: Self-pay | Admitting: Family

## 2023-05-04 DIAGNOSIS — N183 Chronic kidney disease, stage 3 unspecified: Secondary | ICD-10-CM

## 2023-05-06 DIAGNOSIS — R42 Dizziness and giddiness: Secondary | ICD-10-CM | POA: Diagnosis not present

## 2023-05-08 ENCOUNTER — Ambulatory Visit: Payer: Medicare Other | Attending: Cardiology | Admitting: Cardiology

## 2023-05-08 ENCOUNTER — Encounter: Payer: Self-pay | Admitting: Cardiology

## 2023-05-08 ENCOUNTER — Other Ambulatory Visit
Admission: RE | Admit: 2023-05-08 | Discharge: 2023-05-08 | Disposition: A | Discharge status: Home / Self Care | Payer: Medicare Other | Source: Ambulatory Visit | Attending: Family | Admitting: Family

## 2023-05-08 VITALS — BP 132/74 | HR 79 | Ht 65.0 in | Wt 254.6 lb

## 2023-05-08 DIAGNOSIS — I10 Essential (primary) hypertension: Secondary | ICD-10-CM | POA: Insufficient documentation

## 2023-05-08 DIAGNOSIS — N183 Chronic kidney disease, stage 3 unspecified: Secondary | ICD-10-CM | POA: Diagnosis not present

## 2023-05-08 DIAGNOSIS — I6523 Occlusion and stenosis of bilateral carotid arteries: Secondary | ICD-10-CM

## 2023-05-08 DIAGNOSIS — I4892 Unspecified atrial flutter: Secondary | ICD-10-CM

## 2023-05-08 LAB — BASIC METABOLIC PANEL
Anion gap: 9 (ref 5–15)
BUN: 16 mg/dL (ref 8–23)
CO2: 25 mmol/L (ref 22–32)
Calcium: 9.2 mg/dL (ref 8.9–10.3)
Chloride: 101 mmol/L (ref 98–111)
Creatinine, Ser: 1.3 mg/dL — ABNORMAL HIGH (ref 0.44–1.00)
GFR, Estimated: 43 mL/min — ABNORMAL LOW (ref >60)
Glucose, Bld: 102 mg/dL — ABNORMAL HIGH (ref 70–99)
Potassium: 4.2 mmol/L (ref 3.5–5.1)
Sodium: 135 mmol/L (ref 135–145)

## 2023-05-08 MED ORDER — METOPROLOL SUCCINATE ER 25 MG PO TB24: 12.5000 mg | ORAL_TABLET | Freq: Every day | ORAL | 3 refills | Status: DC

## 2023-05-08 MED ORDER — APIXABAN 5 MG PO TABS: 5.0000 mg | ORAL_TABLET | Freq: Two times a day (BID) | ORAL | 3 refills | Status: DC

## 2023-05-08 NOTE — Patient Instructions (Signed)
Medication Instructions:   .START Eliquis - take one tablet ( 5mg ) by mouth twice a day.  START Toprol XL - Take half tablet ( 12.5mg ) by mouth daily.   *If you need a refill on your cardiac medications before your next appointment, please call your pharmacy*   Lab Work:  None Ordered  If you have labs (blood work) drawn today and your tests are completely normal, you will receive your results only by: MyChart Message (if you have MyChart) OR A paper copy in the mail If you have any lab test that is abnormal or we need to change your treatment, we will call you to review the results.   Testing/Procedures:  None Ordered   Follow-Up: At [facility], you and your health needs are our priority.  As part of our continuing mission to provide you with exceptional heart care, we have created designated Provider Care Teams.  These Care Teams include your primary Cardiologist (physician) and Advanced Practice Providers (APPs -  Physician Assistants and Nurse Practitioners) who all work together to provide you with the care you need, when you need it.  We recommend signing up for the patient portal called "MyChart".  Sign up information is provided on this After Visit Summary.  MyChart is used to connect with patients for Virtual Visits (Telemedicine).  Patients are able to view lab/test results, encounter notes, upcoming appointments, etc.  Non-urgent messages can be sent to your provider as well.   To learn more about what you can do with MyChart, go to [email or web address].    Your next appointment:   4 month(s)  Provider:   You may see Debbe Odea, MD or one of the following Advanced Practice Providers on your designated Care Team:   Nicolasa Ducking, NP Eula Listen, PA-C Cadence Fransico Michael, PA-C Charlsie Quest, NP

## 2023-05-08 NOTE — Progress Notes (Signed)
Cardiology Office Note:    Date:  05/08/2023   ID:  Julie Rush, DOB 1951-10-25, MRN [id number]  PCP:  Allegra Grana, FNP   [facility] Providers Cardiologist:  Debbe Odea, MD     Referring MD: Allegra Grana, FNP   Chief Complaint  Patient presents with   New Patient (Initial Visit)    Referred by PCP for irregularities seen on heart monitor.  No known cardiac history    History of Present Illness:    Julie Rush is a 72 y.o. female with a hx of GERD, CKD 3 who presented due to dizziness.  Presented to the ED 03/23/2023 with symptoms of dizziness, associated shortness of breath.  Previously took Toprol-XL for hypertension, this was stopped as her BPs were running low.  Not sure of prior dose of Toprol-XL.  CT chest pending.  In the ED showed no PE.   She followed up with PCP who ordered ultrasound and cardiac monitor.   Echocardiogram 03/2023 EF 65 to 70%.  Mild aortic stenosis. Carotid ultrasound 04/23/2023 mild nonobstructive bilateral carotid stenosis Cardiac monitor 05/06/2023 showed paroxysmal atrial flutter less than 1%.  Complains of palpitations lasting a few seconds occurring at least 5 days weekly.  Has dizziness with changing positions such as standing from seated position, bending.   Past Medical History:  Diagnosis Date   Anginal pain (HCC)    Arthritis    "knees, back, fingers, shoulders, neck"   Chronic lower back pain    Depression 06/08/2012   "related to not getting definitive dx of what's wrong"   Exertional dyspnea    GERD (gastroesophageal reflux disease)    H/O hiatal hernia    Headache(784.0)    History of stomach ulcers 1980's   Hypertension    Migraines    4-5x/yr   Pneumonia    PONV (postoperative nausea and vomiting)    Seasonal asthma    Sleep apnea 2013   "dx'd borderline"   Vertigo    last episode over 6 mos ago    Past Surgical History:  Procedure Laterality Date   APPENDECTOMY  1960's   BREAST  BIOPSY Right    right   CATARACT EXTRACTION W/PHACO Left 11/01/2018   Procedure: CATARACT EXTRACTION PHACO AND INTRAOCULAR LENS PLACEMENT (IOC) LEFT;  Surgeon: Nevada Crane, MD;  Location: [facility];  Service: Ophthalmology;  Laterality: Left;   CATARACT EXTRACTION W/PHACO Right 11/29/2018   Procedure: CATARACT EXTRACTION PHACO AND INTRAOCULAR LENS PLACEMENT (IOC) RIGHT;  Surgeon: Nevada Crane, MD;  Location: [facility];  Service: Ophthalmology;  Laterality: Right;  Diabetes - diet conrolled sleep apnea   CHOLECYSTECTOMY  2006   COLONOSCOPY WITH PROPOFOL N/A 10/30/2020   Procedure: COLONOSCOPY WITH PROPOFOL;  Surgeon: Regis Bill, MD;  Location: [facility];  Service: Endoscopy;  Laterality: N/A;   EXPLORATORY LAPAROTOMY  late 1990's   "blocked colon S/P hernia repair"   HERNIA REPAIR     "@ least 3 abdominal hernia repairs"   LUMBAR DISC SURGERY  1981   SHOULDER ARTHROSCOPY WITH ROTATOR CUFF REPAIR AND SUBACROMIAL DECOMPRESSION Right 05/25/2019   Procedure: Right shoulder mini open rotator cuff repair, subacromial decompression;  Surgeon: Jene Every, MD;  Location: [facility];  Service: Orthopedics;  Laterality: Right;  90 mins   VAGINAL HYSTERECTOMY  1981    Current Medications: Current Meds  Medication Sig   acetaminophen (TYLENOL) 500 MG tablet Take 1,000 mg by mouth every 6 (six) hours  as needed for moderate pain or headache.    albuterol (VENTOLIN HFA) 108 (90 Base) MCG/ACT inhaler INHALE 2 PUFFS BY MOUTH EVERY 6 HOURS AS NEEDED FOR WHEEZE OR SHORTNESS OF BREATH   apixaban (ELIQUIS) 5 MG TABS tablet Take 1 tablet (5 mg total) by mouth 2 (two) times daily.   b complex vitamins tablet Take 2 tablets by mouth daily.   buPROPion (WELLBUTRIN XL) 300 MG 24 hr tablet TAKE 1 TABLET (300 MG TOTAL) BY MOUTH EVERY MORNING.   Coenzyme Q10 200 MG/GM POWD Take by mouth.   metoprolol succinate (TOPROL XL) 25 MG 24 hr tablet Take 0.5 tablets (12.5 mg total) by  mouth daily.   omeprazole (PRILOSEC) 20 MG capsule Take 20 mg by mouth daily as needed (heartburn).      Allergies:   Codeine, Prednisone, Sulfa antibiotics, and Trimethoprim   Social History   Socioeconomic History   Marital status: Married    Spouse name: Not on file   Number of children: Not on file   Years of education: Not on file   Highest education level: Not on file  Occupational History   Not on file  Tobacco Use   Smoking status: Former    Packs/day: 2.00    Years: 14.00    Additional pack years: 0.00    Total pack years: 28.00    Types: Cigarettes    Quit date: 11/08/1975    Years since quitting: 47.5    Passive exposure: Past   Smokeless tobacco: Never  Vaping Use   Vaping Use: Never used  Substance and Sexual Activity   Alcohol use: No    Alcohol/week: 0.0 standard drinks of alcohol   Drug use: No   Sexual activity: Yes    Partners: Male    Comment: Husband  Other Topics Concern   Not on file  Social History Narrative   Retired.   Lives with husband    Grown children- 2 daughters    2 grandsons    Pets- goats and dogs    Right handed    Caffeine- 1 soda a day, decaff coffee and tea other times    Enjoys reading    Social Determinants of Health   Financial Resource Strain: Low Risk  (09/25/2022)   Overall Financial Resource Strain (CARDIA)    Difficulty of Paying Living Expenses: Not hard at all  Food Insecurity: No Food Insecurity (03/23/2023)   Hunger Vital Sign    Worried About Running Out of Food in the Last Year: Never true    Ran Out of Food in the Last Year: Never true  Transportation Needs: No Transportation Needs (03/23/2023)   PRAPARE - Administrator, Civil Service (Medical): No    Lack of Transportation (Non-Medical): No  Physical Activity: Not on file  Stress: No Stress Concern Present (09/25/2022)   [employer] of Occupational Health - Occupational Stress Questionnaire    Feeling of Stress : Not at all  Social  Connections: Unknown (09/25/2022)   Social Connection and Isolation Panel [NHANES]    Frequency of Communication with Friends and Family: Not on file    Frequency of Social Gatherings with Friends and Family: Not on file    Attends Religious Services: Not on file    Active Member of Clubs or Organizations: Not on file    Attends Banker Meetings: Not on file    Marital Status: Married     Family History: The patient's family history includes  Alcohol abuse in her father; Arthritis in her brother, father, maternal grandfather, maternal grandmother, mother, sister, and sister; Breast cancer (age of onset: 96) in her sister; Cancer in her brother, father, sister, and sister; Diabetes in her father; Heart disease in her brother; Heart disease (age of onset: 33) in her father; Hyperlipidemia in her father; Hypertension in her father; Parkinson's disease in her sister; Stroke in her father.  ROS:   Please see the history of present illness.     All other systems reviewed and are negative.  EKGs/Labs/Other Studies Reviewed:    The following studies were reviewed today:   EKG:  EKG is  ordered today.  The ekg ordered today demonstrates normal sinus rhythm, possible old septal infarct  Recent Labs: 03/23/2023: ALT 19; B Natriuretic Peptide 30.9; Hemoglobin 11.5; Platelets 252 03/24/2023: TSH 1.663 05/08/2023: BUN 16; Creatinine, Ser 1.30; Potassium 4.2; Sodium 135  Recent Lipid Panel    Component Value Date/Time   CHOL 186 03/03/2022 1009   TRIG 100.0 03/03/2022 1009   HDL 58.00 03/03/2022 1009   CHOLHDL 3 03/03/2022 1009   VLDL 20.0 03/03/2022 1009   LDLCALC 108 (H) 03/03/2022 1009   LDLDIRECT 110.0 11/02/2017 1416     Risk Assessment/Calculations:               Physical Exam:    VS:  BP 132/74 (BP Location: Left Arm, Patient Position: Sitting, Cuff Size: Large)   Pulse 79   Ht 5\' 5"  (1.651 m)   Wt 254 lb 9.6 oz (115.5 kg)   SpO2 100%   BMI 42.37 kg/m     Wt  Readings from Last 3 Encounters:  05/08/23 254 lb 9.6 oz (115.5 kg)  04/03/23 254 lb (115.2 kg)  03/23/23 258 lb (117 kg)     GEN:  Well nourished, well developed in no acute distress HEENT: Normal NECK: No JVD; No carotid bruits CARDIAC: RRR, no murmurs, rubs, gallops RESPIRATORY:  Clear to auscultation without rales, wheezing or rhonchi  ABDOMEN: Soft, non-tender, non-distended MUSCULOSKELETAL:  No edema; No deformity  SKIN: Warm and dry NEUROLOGIC:  Alert and oriented x 3 PSYCHIATRIC:  Normal affect   ASSESSMENT:    1. Atrial flutter, unspecified type (HCC)   2. Primary hypertension   3. Bilateral carotid artery stenosis    PLAN:    In order of problems listed above:  Paroxysmal atrial flutter on cardiac monitor, occasional palpitations.  Start Toprol-XL 12.5 mg daily, start Eliquis 5 mg twice daily. Hypertension, BP controlled.  Toprol-XL 12.5 mg daily as above. Mild bilateral carotid artery stenosis, refer to vascular surgery.  Obtain lipid panel.     Follow-up in 3 to 4 months.  Medication Adjustments/Labs and Tests Ordered: Current medicines are reviewed at length with the patient today.  Concerns regarding medicines are outlined above.  Orders Placed This Encounter  Procedures   Ambulatory referral to Cardiac Electrophysiology   Ambulatory referral to Vascular Surgery   EKG 12-Lead   Meds ordered this encounter  Medications   apixaban (ELIQUIS) 5 MG TABS tablet    Sig: Take 1 tablet (5 mg total) by mouth 2 (two) times daily.    Dispense:  60 tablet    Refill:  3   metoprolol succinate (TOPROL XL) 25 MG 24 hr tablet    Sig: Take 0.5 tablets (12.5 mg total) by mouth daily.    Dispense:  45 tablet    Refill:  3    Patient Instructions  Medication Instructions:   .  START Eliquis - take one tablet ( 5mg ) by mouth twice a day.  START Toprol XL - Take half tablet ( 12.5mg ) by mouth daily.   *If you need a refill on your cardiac medications before your next  appointment, please call your pharmacy*   Lab Work:  None Ordered  If you have labs (blood work) drawn today and your tests are completely normal, you will receive your results only by: MyChart Message (if you have MyChart) OR A paper copy in the mail If you have any lab test that is abnormal or we need to change your treatment, we will call you to review the results.   Testing/Procedures:  None Ordered   Follow-Up: At [facility], you and your health needs are our priority.  As part of our continuing mission to provide you with exceptional heart care, we have created designated Provider Care Teams.  These Care Teams include your primary Cardiologist (physician) and Advanced Practice Providers (APPs -  Physician Assistants and Nurse Practitioners) who all work together to provide you with the care you need, when you need it.  We recommend signing up for the patient portal called "MyChart".  Sign up information is provided on this After Visit Summary.  MyChart is used to connect with patients for Virtual Visits (Telemedicine).  Patients are able to view lab/test results, encounter notes, upcoming appointments, etc.  Non-urgent messages can be sent to your provider as well.   To learn more about what you can do with MyChart, go to [email or web address].    Your next appointment:   4 month(s)  Provider:   You may see Debbe Odea, MD or one of the following Advanced Practice Providers on your designated Care Team:   Nicolasa Ducking, NP Eula Listen, PA-C Cadence Fransico Michael, PA-C Charlsie Quest, NP    Signed, Debbe Odea, MD  05/08/2023 4:50 PM    [facility]

## 2023-05-12 ENCOUNTER — Ambulatory Visit (HOSPITAL_BASED_OUTPATIENT_CLINIC_OR_DEPARTMENT_OTHER): Payer: Medicare Other | Admitting: Physical Therapy

## 2023-05-12 DIAGNOSIS — N9089 Other specified noninflammatory disorders of vulva and perineum: Secondary | ICD-10-CM | POA: Diagnosis not present

## 2023-05-13 ENCOUNTER — Other Ambulatory Visit: Payer: Self-pay | Admitting: Family

## 2023-05-13 DIAGNOSIS — I10 Essential (primary) hypertension: Secondary | ICD-10-CM

## 2023-05-15 ENCOUNTER — Encounter (INDEPENDENT_AMBULATORY_CARE_PROVIDER_SITE_OTHER): Payer: Self-pay | Admitting: Nurse Practitioner

## 2023-05-15 ENCOUNTER — Ambulatory Visit (INDEPENDENT_AMBULATORY_CARE_PROVIDER_SITE_OTHER): Payer: Medicare Other | Admitting: Nurse Practitioner

## 2023-05-15 VITALS — BP 136/83 | HR 67 | Resp 18 | Ht 65.0 in | Wt 257.2 lb

## 2023-05-15 DIAGNOSIS — N183 Chronic kidney disease, stage 3 unspecified: Secondary | ICD-10-CM | POA: Diagnosis not present

## 2023-05-15 DIAGNOSIS — I10 Essential (primary) hypertension: Secondary | ICD-10-CM | POA: Diagnosis not present

## 2023-05-15 DIAGNOSIS — I6523 Occlusion and stenosis of bilateral carotid arteries: Secondary | ICD-10-CM

## 2023-05-15 NOTE — Progress Notes (Signed)
In the setting of her carotid duplex being somewhat questionable, I usually would order a CTA neck to determine if there is a higher degree of carotid stenosis missed by the ultrasound.  I typically wouldn't evaluate a CTA of the head because we usually don't repair intracranial disease, in addition the CTA neck is better to evaluate her vertebral and subclavian arteries.  Stenosis in these arteries can cause some associated dizziness if significant.  I think that if nephrology doesn't have issues, moving forward with a CTA neck is reasonable.

## 2023-05-15 NOTE — Progress Notes (Signed)
Subjective:    Patient ID: Julie Rush, female    DOB: 22-Apr-1951, 72 y.o.   MRN: [id number] Chief Complaint  Patient presents with  . New Patient (Initial Visit)    np. consult. carotid stenosis.    HPI  Review of Systems  Musculoskeletal:  Positive for arthralgias.  Neurological:  Positive for dizziness.  All other systems reviewed and are negative.      Objective:   Physical Exam Vitals reviewed.  HENT:     Head: Normocephalic.  Cardiovascular:     Rate and Rhythm: Normal rate.     Pulses:          Radial pulses are 2+ on the right side and 2+ on the left side.  Pulmonary:     Effort: Pulmonary effort is normal.  Musculoskeletal:     Right lower leg: 1+ Edema present.     Left lower leg: 1+ Edema present.  Neurological:     Mental Status: She is alert and oriented to person, place, and time.  Psychiatric:        Mood and Affect: Mood normal.        Behavior: Behavior normal.        Thought Content: Thought content normal.        Judgment: Judgment normal.    BP 136/83 (BP Location: Right Arm)   Pulse 67   Resp 18   Ht 5\' 5"  (1.651 m)   Wt 257 lb 3.2 oz (116.7 kg)   BMI 42.80 kg/m   Past Medical History:  Diagnosis Date  . Anginal pain (HCC)   . Arthritis    "knees, back, fingers, shoulders, neck"  . Chronic lower back pain   . Depression 06/08/2012   "related to not getting definitive dx of what's wrong"  . Exertional dyspnea   . GERD (gastroesophageal reflux disease)   . H/O hiatal hernia   . Headache(784.0)   . History of stomach ulcers 1980's  . Hypertension   . Migraines    4-5x/yr  . Pneumonia   . PONV (postoperative nausea and vomiting)   . Seasonal asthma   . Sleep apnea 2013   "dx'd borderline"  . Vertigo    last episode over 6 mos ago    Social History   Socioeconomic History  . Marital status: Married    Spouse name: Not on file  . Number of children: Not on file  . Years of education: Not on file  . Highest  education level: Not on file  Occupational History  . Not on file  Tobacco Use  . Smoking status: Former    Packs/day: 2.00    Years: 14.00    Additional pack years: 0.00    Total pack years: 28.00    Types: Cigarettes    Quit date: 11/08/1975    Years since quitting: 47.5    Passive exposure: Past  . Smokeless tobacco: Never  Vaping Use  . Vaping Use: Never used  Substance and Sexual Activity  . Alcohol use: No    Alcohol/week: 0.0 standard drinks of alcohol  . Drug use: No  . Sexual activity: Yes    Partners: Male    Comment: Husband  Other Topics Concern  . Not on file  Social History Narrative   Retired.   Lives with husband    Grown children- 2 daughters    2 grandsons    Pets- goats and dogs    Right handed  Caffeine- 1 soda a day, decaff coffee and tea other times    Enjoys reading    Social Determinants of Health   Financial Resource Strain: Low Risk  (09/25/2022)   Overall Financial Resource Strain (CARDIA)   . Difficulty of Paying Living Expenses: Not hard at all  Food Insecurity: No Food Insecurity (03/23/2023)   Hunger Vital Sign   . Worried About Programme researcher, broadcasting/film/video in the Last Year: Never true   . Ran Out of Food in the Last Year: Never true  Transportation Needs: No Transportation Needs (03/23/2023)   PRAPARE - Transportation   . Lack of Transportation (Medical): No   . Lack of Transportation (Non-Medical): No  Physical Activity: Not on file  Stress: No Stress Concern Present (09/25/2022)   [employer] of Occupational Health - Occupational Stress Questionnaire   . Feeling of Stress : Not at all  Social Connections: Unknown (09/25/2022)   Social Connection and Isolation Panel [NHANES]   . Frequency of Communication with Friends and Family: Not on file   . Frequency of Social Gatherings with Friends and Family: Not on file   . Attends Religious Services: Not on file   . Active Member of Clubs or Organizations: Not on file   . Attends Tax inspector Meetings: Not on file   . Marital Status: Married  Catering manager Violence: Not At Risk (03/23/2023)   Humiliation, Afraid, Rape, and Kick questionnaire   . Fear of Current or Ex-Partner: No   . Emotionally Abused: No   . Physically Abused: No   . Sexually Abused: No    Past Surgical History:  Procedure Laterality Date  . APPENDECTOMY  1960's  . BREAST BIOPSY Right    right  . CATARACT EXTRACTION W/PHACO Left 11/01/2018   Procedure: CATARACT EXTRACTION PHACO AND INTRAOCULAR LENS PLACEMENT (IOC) LEFT;  Surgeon: Nevada Crane, MD;  Location: [facility];  Service: Ophthalmology;  Laterality: Left;  . CATARACT EXTRACTION W/PHACO Right 11/29/2018   Procedure: CATARACT EXTRACTION PHACO AND INTRAOCULAR LENS PLACEMENT (IOC) RIGHT;  Surgeon: Nevada Crane, MD;  Location: [facility];  Service: Ophthalmology;  Laterality: Right;  Diabetes - diet conrolled sleep apnea  . CHOLECYSTECTOMY  2006  . COLONOSCOPY WITH PROPOFOL N/A 10/30/2020   Procedure: COLONOSCOPY WITH PROPOFOL;  Surgeon: Regis Bill, MD;  Location: [facility];  Service: Endoscopy;  Laterality: N/A;  . EXPLORATORY LAPAROTOMY  late 1990's   "blocked colon S/P hernia repair"  . HERNIA REPAIR     "@ least 3 abdominal hernia repairs"  . LUMBAR DISC SURGERY  1981  . SHOULDER ARTHROSCOPY WITH ROTATOR CUFF REPAIR AND SUBACROMIAL DECOMPRESSION Right 05/25/2019   Procedure: Right shoulder mini open rotator cuff repair, subacromial decompression;  Surgeon: Jene Every, MD;  Location: [facility];  Service: Orthopedics;  Laterality: Right;  90 mins  . VAGINAL HYSTERECTOMY  1981    Family History  Problem Relation Age of Onset  . Arthritis Mother   . Cancer Father        prostate  . Hyperlipidemia Father   . Alcohol abuse Father   . Arthritis Father   . Heart disease Father 40       paased away at 36 from MI  . Stroke Father   . Hypertension Father   . Diabetes Father   . Cancer Sister         ovary  . Arthritis Sister   . Parkinson's disease Sister   . Cancer  Sister        breast  . Arthritis Sister   . Breast cancer Sister 77  . Heart disease Brother   . Cancer Brother        thyroid  . Arthritis Brother   . Arthritis Maternal Grandmother   . Arthritis Maternal Grandfather     Allergies  Allergen Reactions  . Codeine Nausea And Vomiting  . Prednisone Palpitations  . Sulfa Antibiotics Itching and Rash  . Trimethoprim Hives       Latest Ref Rng & Units 03/23/2023   11:41 AM 03/03/2022   10:09 AM 12/07/2020    2:45 PM  CBC  WBC 4.0 - 10.5 K/uL 8.4  7.7  7.1   Hemoglobin 12.0 - 15.0 g/dL 16.1  09.6  04.5   Hematocrit 36.0 - 46.0 % 35.8  36.3  34.6   Platelets 150 - 400 K/uL 252  255.0  277       CMP     Component Value Date/Time   NA 135 05/08/2023 1445   NA 139 08/26/2013 0000   K 4.2 05/08/2023 1445   CL 101 05/08/2023 1445   CO2 25 05/08/2023 1445   GLUCOSE 102 (H) 05/08/2023 1445   BUN 16 05/08/2023 1445   BUN 17 08/26/2013 0000   CREATININE 1.30 (H) 05/08/2023 1445   CREATININE 1.08 (H) 12/07/2020 1445   CALCIUM 9.2 05/08/2023 1445   PROT 7.0 03/23/2023 1141   PROT 8.9 (H) 12/20/2020 1408   ALBUMIN 3.8 03/23/2023 1141   AST 28 03/23/2023 1141   ALT 19 03/23/2023 1141   ALKPHOS 53 03/23/2023 1141   BILITOT 0.7 03/23/2023 1141   GFRNONAA 43 (L) 05/08/2023 1445   GFRAA >60 05/26/2019 0439     No results found.     Assessment & Plan:   1. Bilateral carotid artery stenosis Following discussion with the patient there was mild evidence of disease on the ultrasound but there was an area of questionable increased waveforms which may suggest some increased distal disease.  However, the patient's symptoms have subsided with beginning metoprolol which is somewhat suggestive that the atrial flutter may have played a role in her ongoing dizziness.  There are some discussion about a possible CTA of the head and neck in order to evaluate this  area further but there is concern by the patient, understandably so, that a CT scan may worsen her chronic kidney disease in the setting of just having received a recent CT scan.  Given the fact that the disease overall.  Minimal I think it is reasonable to hold off on a CT scan at this time and have the patient back in 3 months in order to reevaluate the carotid disease.  If there are some continued questions about high-grade stenosis we can order a CTA at that time.  This was discussed with the patient and she is agreeable.  2. Essential hypertension, benign Continue antihypertensive medications as already ordered, these medications have been reviewed and there are no changes at this time.  3. Stage 3 chronic kidney disease, unspecified whether stage 3a or 3b CKD (HCC) In the setting of the patient's chronic kidney disease and the fact that her symptoms have improved,, and that it would be reasonable to hold off on CT angiogram at this time.     Current Outpatient Medications on File Prior to Visit  Medication Sig Dispense Refill  . acetaminophen (TYLENOL) 500 MG tablet Take 1,000 mg by mouth every 6 (six)  hours as needed for moderate pain or headache.     . albuterol (VENTOLIN HFA) 108 (90 Base) MCG/ACT inhaler INHALE 2 PUFFS BY MOUTH EVERY 6 HOURS AS NEEDED FOR WHEEZE OR SHORTNESS OF BREATH 6.7 each 1  . b complex vitamins tablet Take 2 tablets by mouth daily.    Marland Kitchen buPROPion (WELLBUTRIN XL) 300 MG 24 hr tablet TAKE 1 TABLET (300 MG TOTAL) BY MOUTH EVERY MORNING. 90 tablet 1  . Coenzyme Q10 200 MG/GM POWD Take by mouth.    . metoprolol succinate (TOPROL XL) 25 MG 24 hr tablet Take 0.5 tablets (12.5 mg total) by mouth daily. 45 tablet 3  . omeprazole (PRILOSEC) 20 MG capsule Take 20 mg by mouth daily as needed (heartburn).     Marland Kitchen apixaban (ELIQUIS) 5 MG TABS tablet Take 1 tablet (5 mg total) by mouth 2 (two) times daily. (Patient not taking: Reported on 05/15/2023) 60 tablet 3   No current  facility-administered medications on file prior to visit.    There are no Patient Instructions on file for this visit. No follow-ups on file.   Georgiana Spinner, NP

## 2023-05-25 DIAGNOSIS — N2581 Secondary hyperparathyroidism of renal origin: Secondary | ICD-10-CM | POA: Diagnosis not present

## 2023-05-25 DIAGNOSIS — D631 Anemia in chronic kidney disease: Secondary | ICD-10-CM | POA: Diagnosis not present

## 2023-05-25 DIAGNOSIS — I10 Essential (primary) hypertension: Secondary | ICD-10-CM | POA: Diagnosis not present

## 2023-05-25 DIAGNOSIS — N1831 Chronic kidney disease, stage 3a: Secondary | ICD-10-CM | POA: Diagnosis not present

## 2023-06-03 DIAGNOSIS — N9089 Other specified noninflammatory disorders of vulva and perineum: Secondary | ICD-10-CM | POA: Diagnosis not present

## 2023-06-08 ENCOUNTER — Other Ambulatory Visit: Payer: Self-pay | Admitting: Obstetrics and Gynecology

## 2023-06-11 ENCOUNTER — Ambulatory Visit: Payer: Medicare Other | Admitting: Cardiology

## 2023-06-23 NOTE — Progress Notes (Unsigned)
Electrophysiology Office Note:    Date:  06/24/2023   ID:  Julie Rush, DOB 1951/11/14, MRN [id number]  [facility] Cardiologist:  Debbe Odea, MD  [facility] Electrophysiologist:  Lanier Prude, MD   Referring MD: Marykay Lex, MD   Chief Complaint: Atrial flutter  History of Present Illness:    Julie Rush is a 72 y.o. femalewho I am seeing today for an evaluation of atrial flutter at the request of Dr. Azucena Cecil.  The patient was last seen by Dr. Azucena Cecil on May 08, 2023.  The patient has a medical history that includes GERD, hypertension, borderline sleep apnea, obesity.   She has previously worn a heart monitor which showed paroxysmal atrial flutter associate with palpitations.  She was started on metoprolol and Eliquis at the appointment with Dr. Azucena Cecil.  Today she is with her daughter in clinic.  She tells me that she did not start her Eliquis given concerns about potential side effects to her kidneys.  She has no history of bleeding issues.  When she felt atrial fibrillation before that would be fluttering in her chest.  No nervous sensation.  She did not experience shortness of breath, chest pain or lightheadedness.     Their past medical, social and family history was reveiwed.   ROS:   Please see the history of present illness.    All other systems reviewed and are negative.  EKGs/Labs/Other Studies Reviewed:    The following studies were reviewed today:  May 07, 2023 ZIO monitor Less than 1% burden of atrial fibrillation/flutter, average rate 97 bpm        Physical Exam:    VS:  BP 118/70   Pulse 75   Ht 5\' 5"  (1.651 m)   Wt 253 lb (114.8 kg)   SpO2 97%   BMI 42.10 kg/m     Wt Readings from Last 3 Encounters:  06/24/23 253 lb (114.8 kg)  05/15/23 257 lb 3.2 oz (116.7 kg)  05/08/23 254 lb 9.6 oz (115.5 kg)     GEN:  Well nourished, well developed in no acute distress.  Obese CARDIAC: RRR, no murmurs,  rubs, gallops RESPIRATORY:  Clear to auscultation without rales, wheezing or rhonchi       ASSESSMENT AND PLAN:    1. Paroxysmal atrial fibrillation (HCC)   2. Atrial flutter, unspecified type (HCC)   3. Primary hypertension     #pAF/AFL Symptomatic. Low burden. She was previously prescribed Eliquis but did not start the medication.  We had an extensive conversation today about the pathophysiology of her atrial fibrillation and its associated stroke risk.  We discussed her risk of stroke given her age, gender, diet-controlled diabetes and hypertension. After our discussion, the patient is planning to start her Eliquis.  I will have her come back to clinic in about 6 months with Rosalita Chessman.  She will reach out to our clinic should she experience an intensification of her A-fib symptoms.  CHA2DS2-VASc Score = 4  The patient's score is based upon: CHF History: 0 HTN History: 1 Diabetes History: 1 Stroke History: 0 Vascular Disease History: 0 Age Score: 1 Gender Score: 1   #Hypertension At goal today.  Recommend checking blood pressures 1-2 times per week at home and recording the values.  Recommend bringing these recordings to the primary care physician.  #Diabetes Diet controlled.  Continue primary care follow-up.    Total time of encounter: 61 minutes total time of encounter, including face-to-face patient care, coordination  of care and counseling regarding high complexity medical decision making re: AF.    Signed, Rossie Muskrat. Lalla Brothers, MD, [facility], [facility] 06/24/2023 11:13 AM    Electrophysiology [facility]

## 2023-06-24 ENCOUNTER — Ambulatory Visit: Payer: Medicare Other | Attending: Cardiology | Admitting: Cardiology

## 2023-06-24 ENCOUNTER — Encounter: Payer: Self-pay | Admitting: Cardiology

## 2023-06-24 VITALS — BP 118/70 | HR 75 | Ht 65.0 in | Wt 253.0 lb

## 2023-06-24 DIAGNOSIS — I480 Paroxysmal atrial fibrillation: Secondary | ICD-10-CM | POA: Diagnosis not present

## 2023-06-24 DIAGNOSIS — I4892 Unspecified atrial flutter: Secondary | ICD-10-CM | POA: Insufficient documentation

## 2023-06-24 DIAGNOSIS — I10 Essential (primary) hypertension: Secondary | ICD-10-CM | POA: Diagnosis not present

## 2023-06-24 NOTE — Patient Instructions (Addendum)
Medication Instructions:  Your physician has recommended you make the following change in your medication:  1) RESTART taking Eliquis 5 mg twice daily  *If you need a refill on your cardiac medications before your next appointment, please call your pharmacy*  Follow-Up: At [facility], you and your health needs are our priority.  As part of our continuing mission to provide you with exceptional heart care, we have created designated Provider Care Teams.  These Care Teams include your primary Cardiologist (physician) and Advanced Practice Providers (APPs -  Physician Assistants and Nurse Practitioners) who all work together to provide you with the care you need, when you need it.  Your next appointment:   6 month(s)  Provider:   Sherie Don, NP

## 2023-06-26 DIAGNOSIS — N9089 Other specified noninflammatory disorders of vulva and perineum: Secondary | ICD-10-CM | POA: Diagnosis not present

## 2023-06-26 NOTE — H&P (Signed)
Preoperative History and Physical   Chief Complaint: Julie Rush is a 72 y.o. female here for surgical management of vulvar lesion.   No significant preoperative concerns.   History of Present Illness:  72 y.o. female who presents for a right vulvar lesion that has been present for a year.  She notes that the lesion will bleed. Sometimes there is pain and pressure. She has not noted pus or other infectious signs. She had a CT of the area which did not demonstrate a significant soft-tissue infection. The area will swell then go down. She notes leakage of blood.  The area is painful. She doesn't treat the area frequently.     Since her last visit she has been diagnosed with atrial fibrillation and has started Eliquis 5 mg twice daily.    Proposed surgery: Right vulvar lesion biopsy/removal   Past Medical History      Past Medical History:  Diagnosis Date   Anxiety     Depression     Diabetes mellitus without complication (CMS/HHS-HCC)     GERD (gastroesophageal reflux disease)     Hypertension     Mass of right breast 11/03/2017   Sleep apnea        Past Surgical History       Past Surgical History:  Procedure Laterality Date   COLONOSCOPY   10/30/2020    Diverticulosis/Otherwise normal colon/Repeat 12yrs/CTL   afib   04/2023   APPENDECTOMY       ARTHROSCOPIC ROTATOR CUFF REPAIR Right     BREAST BIOPSY       CHOLECYSTECTOMY       HERNIA REPAIR       HYSTERECTOMY       SPINE SURGERY          OB History  No obstetric history on file.  Patient denies any other pertinent gynecologic issues.    Medications Ordered Prior to Encounter        Current Outpatient Medications on File Prior to Visit  Medication Sig Dispense Refill   acetaminophen (TYLENOL) 500 MG tablet Take 1,000 mg by mouth every 6 (six) hours as needed       albuterol 90 mcg/actuation inhaler Inhale 2 inhalations into the lungs every 6 (six) hours as needed       apixaban (ELIQUIS) 5 mg tablet Take 5  mg by mouth 2 (two) times daily       b complex vitamins capsule Take by mouth       buPROPion (WELLBUTRIN SR) 150 MG SR tablet         coenzyme Q10 200 mg/gram Powd Take by mouth       omeprazole (PRILOSEC) 20 MG DR capsule Take by mouth       ALPRAZolam (XANAX) 0.5 MG tablet TAKE 1 TABLET (0.5 MG TOTAL) BY MOUTH 3 (THREE) TIMES DAILY AS NEEDED FOR ANXIETY (PANIC ATTACKS). (Patient not taking: Reported on 05/12/2023)       aspirin 81 MG EC tablet Take 1 tablet by mouth once daily (Patient not taking: Reported on 05/26/2022)       b complex vitamins tablet Take 2 tablets by mouth once daily (Patient not taking: Reported on 06/26/2023)       methocarbamoL (ROBAXIN) 500 MG tablet Take 1 tablet by mouth once daily (Patient not taking: Reported on 06/26/2023)       metoprolol succinate (TOPROL-XL) 25 MG XL tablet Take 12.5 mg by mouth once daily  metoprolol succinate (TOPROL-XL) 50 MG XL tablet Take 1 tablet by mouth once daily (Patient not taking: Reported on 06/26/2023)       naproxen sodium (ALEVE ORAL) take 2 tablets po in the morning and 2 at night (Patient not taking: Reported on 05/12/2023)       naproxen sodium (ALEVE) 220 MG tablet Take 2 tablets by mouth 2 (two) times daily (Patient not taking: Reported on 06/26/2023)       omeprazole magnesium (PRILOSEC ORAL)  (Patient not taking: Reported on 06/26/2023)       peg-electrolyte (NULYTELY) solution Use as directed. (Patient not taking: Reported on 06/26/2023) 4000 mL 0   sodium, potassium, and magnesium (SUPREP) oral solution Take 1 Bottle by mouth as directed One kit contains 2 bottles.  Take both bottles at the times instructed by your provider. (Patient not taking: Reported on 06/03/2023) 354 mL 0    No current facility-administered medications on file prior to visit.      Allergies       Allergies  Allergen Reactions   Codeine Nausea And Vomiting, Nausea, Other (See Comments) and Vomiting      Other reaction(s): Unknown   Prednisone  Palpitations and Other (See Comments)   Sulfa (Sulfonamide Antibiotics) Itching, Rash and Unknown   Sulfur Dioxide Hives   Trimethoprim Hives        Social History:   reports that she has quit smoking. Her smoking use included cigarettes. She has never used smokeless tobacco. She reports that she does not drink alcohol and does not use drugs.   Family History        Family History  Problem Relation Name Age of Onset   Skin cancer Mother ABB     Diabetes Father H.B.     Heart disease Father H.B.     High blood pressure (Hypertension) Father H.B.     Prostate cancer Father H.B.     Stroke Father H.B.     Breast cancer Sister Dot Lunsford          Both breast removed   Ovarian cancer Sister Dot Lunsford          Review of Systems: Noncontributory   PHYSICAL EXAM: Blood pressure 128/66, height 165.1 cm (5\' 5" ), weight (!) 114.8 kg (253 lb). CONSTITUTIONAL: Well-developed, well-nourished female in no acute distress.  HENT:  Normocephalic, atraumatic, External right and left ear normal. Oropharynx is clear and moist EYES: Conjunctivae and EOM are normal. Pupils are equal, round, and reactive to light. No scleral icterus.  NECK: Normal range of motion, supple, no masses SKIN: Skin is warm and dry. No rash noted. Not diaphoretic. No erythema. No pallor. NEUROLGIC: Alert and oriented to person, place, and time. Normal reflexes, muscle tone coordination. No cranial nerve deficit noted. PSYCHIATRIC: Normal mood and affect. Normal behavior. Normal judgment and thought content. CARDIOVASCULAR: Normal heart rate noted, regular rhythm RESPIRATORY: Effort and breath sounds normal, no problems with respiration noted ABDOMEN: Soft, nontender, nondistended. PELVIC: Deferred MUSCULOSKELETAL: Normal range of motion. No edema and no tenderness. 2+ distal pulses.   Labs: Recent Results  No results found for this or any previous visit (from the past 336 hour(s)).     Imaging Studies: No  results found.   Assessment: 1. Vulvar lesion       Plan: Patient will undergo surgical management with the above-noted procedure.   The risks of surgery were discussed in detail with the patient including but not limited to: bleeding which may  require transfusion or reoperation; infection which may require antibiotics; injury to surrounding organs which may involve bowel, bladder, ureters ; need for additional procedures including laparoscopy or laparotomy; thromboembolic phenomenon, surgical site problems and other postoperative/anesthesia complications. Likelihood of success in alleviating the patient's condition was discussed. Routine postoperative instructions will be reviewed with the patient and her family in detail after surgery.  The patient concurred with the proposed plan, giving informed written consent for the surgery.   Preoperative prophylactic antibiotics, as indicated, and SCDs ordered on call to the OR.     Hold Eliquis day of surgery.    Return in 20 days (on 07/16/2023) for Post-op incision check.    Attestation Statement:    I personally performed the service. (TP)   Dena Esperanza Teola Bradley, MD  [facility] 06/26/2023 3:47 PM

## 2023-06-29 ENCOUNTER — Encounter
Admission: RE | Admit: 2023-06-29 | Discharge: 2023-06-29 | Disposition: A | Discharge status: Home / Self Care | Payer: Medicare Other | Source: Ambulatory Visit | Attending: Obstetrics and Gynecology | Admitting: Obstetrics and Gynecology

## 2023-06-29 ENCOUNTER — Telehealth: Payer: Self-pay

## 2023-06-29 HISTORY — DX: Morbid (severe) obesity due to excess calories: E66.01

## 2023-06-29 HISTORY — DX: Chronic kidney disease, stage 3 unspecified: N18.30

## 2023-06-29 HISTORY — DX: Nonrheumatic aortic (valve) stenosis: I35.0

## 2023-06-29 HISTORY — DX: Secondary hyperparathyroidism of renal origin: N25.81

## 2023-06-29 HISTORY — DX: Iron deficiency anemia, unspecified: D50.9

## 2023-06-29 HISTORY — DX: Hyperlipidemia, unspecified: E78.5

## 2023-06-29 NOTE — Telephone Encounter (Signed)
1. What type of surgery is being performed?  VULVAR LESION, BIOPSY/REMOVAL   2. When is this surgery scheduled?  07/08/2023    3. Type of clearance being requested (medical, pharmacy, both)?  BOTH    4. Are there any medications that need to be held prior to surgery?  APIXABAN   5. Practice name and name of physician performing surgery?  Performing surgeon: Dr. Thomasene Mohair, MD  Requesting clearance: Quentin Mulling, FNP-C     6. Anesthesia type (none, local, MAC, general)?  GENERAL   7. What is the office phone and fax number?   Fax: [phone]

## 2023-06-29 NOTE — Patient Instructions (Addendum)
Your procedure is scheduled on: Wednesday, July 17 Report to the Registration Desk on the 1st floor of the [facility] To find out your arrival time, please call [phone] between 1PM - 3PM on: Tuesday, July 16 If your arrival time is 6:00 am, do not arrive before that time as the [facility] entrance doors do not open until 6:00 am.  REMEMBER: Instructions that are not followed completely may result in serious medical risk, up to and including death; or upon the discretion of your surgeon and anesthesiologist your surgery may need to be rescheduled.  Do not eat food after midnight the night before surgery.  No gum chewing or hard candies.  You may however, drink CLEAR liquids up to 2 hours before you are scheduled to arrive for your surgery. Do not drink anything within 2 hours of your scheduled arrival time.  Clear liquids include: - water  - apple juice without pulp - gatorade (not RED colors) - black coffee or tea (Do NOT add milk or creamers to the coffee or tea) Do NOT drink anything that is not on this list.  In addition, your doctor has ordered for you to drink the provided:  Ensure Pre-Surgery Clear Carbohydrate Drink  Drinking this carbohydrate drink up to two hours before surgery helps to reduce insulin resistance and improve patient outcomes. Please complete drinking 2 hours before scheduled arrival time.  One week prior to surgery: starting July 10 Stop Anti-inflammatories (NSAIDS) such as Advil, Aleve, Ibuprofen, Motrin, Naproxen, Naprosyn and Aspirin based products such as Excedrin, Goody's Powder, BC Powder. Stop ANY OVER THE COUNTER supplements until after surgery. Stop coenzyme Q10, fish oil, preservision areds You may however, continue to take Tylenol if needed for pain up until the day of surgery.  Continue taking all prescribed medications   TAKE ONLY THESE MEDICATIONS THE MORNING OF SURGERY WITH A SIP OF WATER:  Albuterol inhaler Metoprolol Omeprazole  (Prilosec) Bupropion  Use inhalers on the day of surgery and bring to the hospital.  No Alcohol for 24 hours before or after surgery.  No Smoking including e-cigarettes for 24 hours before surgery.  No chewable tobacco products for at least 6 hours before surgery.  No nicotine patches on the day of surgery.  Do not use any "recreational" drugs for at least a week (preferably 2 weeks) before your surgery.  Please be advised that the combination of cocaine and anesthesia may have negative outcomes, up to and including death. If you test positive for cocaine, your surgery will be cancelled.  On the morning of surgery brush your teeth with toothpaste and water, you may rinse your mouth with mouthwash if you wish. Do not swallow any toothpaste or mouthwash.  Do not wear jewelry, make-up, hairpins, clips or nail polish.  Do not wear lotions, powders, or perfumes.   Do not shave body hair from the neck down 48 hours before surgery.  Contact lenses, hearing aids and dentures may not be worn into surgery.  Do not bring valuables to the hospital. [facility] is not responsible for any missing/lost belongings or valuables.   Notify your doctor if there is any change in your medical condition (cold, fever, infection).  Wear comfortable clothing (specific to your surgery type) to the hospital.  After surgery, you can help prevent lung complications by doing breathing exercises.  Take deep breaths and cough every 1-2 hours.   If you are being discharged the day of surgery, you will not be allowed to  drive home. You will need a responsible individual to drive you home and stay with you for 24 hours after surgery.   If you are taking public transportation, you will need to have a responsible individual with you.  Please call the Pre-admissions Testing Dept. at [phone] if you have any questions about these instructions.  Surgery Visitation Policy:  Patients having surgery or a  procedure may have two visitors.  Children under the age of 66 must have an adult with them who is not the patient.

## 2023-06-30 ENCOUNTER — Other Ambulatory Visit: Payer: Self-pay | Admitting: Family

## 2023-06-30 ENCOUNTER — Encounter: Payer: Self-pay | Admitting: Family

## 2023-06-30 DIAGNOSIS — J209 Acute bronchitis, unspecified: Secondary | ICD-10-CM

## 2023-06-30 NOTE — Telephone Encounter (Signed)
Please advise holding Eliquis prior to vulvar lesion biopsy/removal.  Thank you!  DW

## 2023-06-30 NOTE — Telephone Encounter (Signed)
RX sent in to pharmacy and pt is aware 

## 2023-06-30 NOTE — Telephone Encounter (Signed)
Dr. Azucena Cecil,   You saw this patient on 05/08/2023. Will you please comment on medical clearance for vulvar lesion biopsy/removal scheduled for 07/08/2023?  Please route your response to P CV DIV Preop. I will communicate with requesting office once you have given recommendations.   Thank you!  Carlos Levering, NP

## 2023-06-30 NOTE — Telephone Encounter (Signed)
   Name: [name]  DOB: [birth year]  MRN: [id number]   Primary Cardiologist: Debbe Odea, MD  Chart reviewed as part of pre-operative protocol coverage. Julie Rush was last seen on 05/08/2023 by Dr. Azucena Cecil.  Per Dr. Azucena Cecil "Okay to hold Eliquis 48 hours prior to procedure.  Restart as soon as safely possible after."  Therefore, based on ACC/AHA guidelines, the patient would be at acceptable risk for the planned procedure without further cardiovascular testing.   Per Pharm D, patient may hold Eliquis for 2 days prior to procedure.   I will route this recommendation to the requesting party via Epic fax function and remove from pre-op pool. Please call with questions.  Carlos Levering, NP 06/30/2023, 10:59 AM

## 2023-06-30 NOTE — Telephone Encounter (Signed)
Patient with diagnosis of afib/aflutter on Eliquis for anticoagulation.    Procedure: vulvar lesion biopsy/removal Date of procedure: 07/08/23  CHA2DS2-VASc Score = 4  This indicates a 4.8% annual risk of stroke. The patient's score is based upon: CHF History: 0 HTN History: 1 Diabetes History: 1 Stroke History: 0 Vascular Disease History: 0 Age Score: 1 Gender Score: 1   CrCl 30mL/min using adjusted body weight Platelet count 267K  Per office protocol, patient can hold Eliquis for 2 days prior to procedure.    **This guidance is not considered finalized until pre-operative APP has relayed final recommendations.**

## 2023-07-02 ENCOUNTER — Encounter: Payer: Self-pay | Admitting: Obstetrics and Gynecology

## 2023-07-06 ENCOUNTER — Encounter: Payer: Self-pay | Admitting: Obstetrics and Gynecology

## 2023-07-06 NOTE — Progress Notes (Signed)
Perioperative / Anesthesia Services  Pre-Admission Testing Clinical Review / Preoperative Anesthesia Consult  Date: 07/06/23  Patient Demographics:  Name: Julie Rush DOB:   1951/05/04 MRN:   [id number]  Planned Surgical Procedure(s):    Case: [id number] Date/Time: 07/08/23 0715   Procedures:      VULVAR LESION, BIOPSY/REMOVAL     EXAM UNDER ANESTHESIA   Anesthesia type: Choice   Pre-op diagnosis: right vulvar lesion   Location: [facility] OR ROOM 04 / [facility]   Surgeons: Conard Novak, MD     NOTE: Available PAT nursing documentation and vital signs have been reviewed. Clinical nursing staff has updated patient's PMH/PSHx, current medication list, and drug allergies/intolerances to ensure comprehensive history available to assist in medical decision making as it pertains to the aforementioned surgical procedure and anticipated anesthetic course. Extensive review of available clinical information personally performed. [place] PMH and PSHx updated with any diagnoses/procedures that  may have been inadvertently omitted during her intake with the pre-admission testing department's nursing staff.  Clinical Discussion:  Julie Rush is a 72 y.o. female who is submitted for pre-surgical anesthesia review and clearance prior to her undergoing the above procedure. Patient is a Former Smoker (28 pack years; quit 10/1975). Pertinent PMH includes: CAD, aortic stenosis, atrial fibrillation/flutter, BILATERAL carotid artery stenosis, aortic atherosclerosis, HTN, HLD, T2DM, secondary hyperparathyroidism, CKD-III, seasonal asthma, exertional dyspnea, borderline sleep apnea (no nocturnal PAP therapy), GERD (on daily PPI), hiatal hernia, PUD, IDA, OA, thoracic DDD, chronic lower back pain, RIGHT vulvar lesion  Patient is followed by cardiology Azucena Cecil, MD). She was last seen in the cardiology clinic on 05/08/2023; notes reviewed. At the time of her clinic visit, patient  complaining of episodes of intermittent palpitations and position related dizziness.  She denied any associated chest pain, shortness breath, PND, orthopnea, significant peripheral edema, fatigue, weakness, or presyncope/syncope. Patient with a past medical history significant for cardiovascular diagnoses. Documented physical exam was grossly benign, providing no evidence of acute exacerbation and/or decompensation of the patient's known cardiovascular conditions.  Most recent TTE was performed on 03/24/2023 revealing a normal left ventricular systolic function with a hyperdynamic LVEF of 65-70%.  There were no regional wall motion abnormalities.  Mild LVH noted.  Diastolic Doppler parameters indeterminate.  Right ventricular size and function normal.  There was trivial mitral valve regurgitation.  Mild aortic valve stenosis noted with a mean pressure gradient of 8.0 mmHg; AVA (VTI) = 2.23 cm.  BILATERAL carotid Doppler performed on 04/23/2023 revealing <50% stenosis in the patient's BILATERAL internal carotid arteries.  Vertebral arteries demonstrated antegrade flow.  LEFT mid to distal carotid artery demonstrated high resistance waveforms, which may represent artifactual interference versus possibility of downstream/intracranial disease.  Long-term cardiac event monitor study performed on 05/06/2023 revealing a predominant underlying sinus rhythm at an average rate of 84 bpm; range 63-154 bpm.  Atrial flutter noted with a less than 1.0% study burden; average rate 97 bpm.  There was rare supraventricular and ventricular ectopy noted.  Patient with an atrial fibrillation/flutter diagnosis; CHA2DS2-VASc Score = 5 (age, sex, HTN, vascular disease history, T2DM).at the time of her clinic visit, patient was not on any rate/rhythm controlling interventions.  She was started on metoprolol succinate Her rate and rhythm are currently being maintained on oral metoprolol succinate.  Additionally, patient was also  started on chronic DOAC therapy using apixaban. Blood pressure reasonably controlled at 132/74 mmHg without the use of pharmacological intervention. She is on a omega-3 fatty  acid capsule daily for her HLD diagnosis and ASCVD prevention.  T2DM well-controlled with diet lifestyle modification alone; last HgbA1c was 6.1% when checked on 03/03/2022.  Patient does have an OSAH diagnosis, however she does not utilize nocturnal PAP therapy. Patient is able to complete all of her  ADL/IADLs independently without cardiovascular limitation. Per the DASI, patient is able to achieve at least 4 METS of physical activity without experiencing any significant degree of angina/anginal equivalent symptoms.  No changes were made to her medication regimen.  Patient to follow-up with outpatient cardiology in 3 to 4 months.  Julie Rush is scheduled for EXAM UNDER ANESTHESIA FOR RIGHT VULVAR LESION BIOPSY/REMOVAL on 07/08/2023 with Dr. Thomasene Mohair, MD.  Given patient's past medical history significant for cardiovascular diagnoses, presurgical cardiac clearance was sought by the PAT team. Per cardiology, "based ACC/AHA guidelines, the patient's past medical history, and the amount of time since her last clinic visit, this patient would be at an overall ACCEPTABLE risk for the planned procedure without further cardiovascular testing or intervention at this time".   I again, this patient is on daily oral anticoagulation therapy using a DOAC medication.  Per recommendations from her primary attending surgeon, patient may continue her apixaban dose throughout her perioperative period, holding the medication only on the day of surgery.  Patient has been made aware of recommendations from her surgeon.  Patient reports previous perioperative complications with anesthesia in the past. Patient has a PMH (+) for PONV. Symptoms and history of PONV will be discussed with patient by anesthesia team on the day of her procedure.  Interventions will be ordered as deemed necessary based on patient's individual care needs as determined by anesthesiologist. In review of the available records, it is noted that patient underwent a general anesthetic course here at [facility] ([facility] in 10/2020 without documented complications.      06/29/2023    2:59 PM 06/24/2023   10:58 AM 05/15/2023    1:08 PM  Vitals with BMI  Height 5\' 5"  5\' 5"  5\' 5"   Weight 253 lbs 253 lbs 257 lbs 3 oz  BMI 42.1 42.1 42.8  Systolic  118 136  Diastolic  70 83  Pulse  75 67    Providers/Specialists:   NOTE: Primary physician provider listed below. Patient may have been seen by APP or partner within same practice.   PROVIDER ROLE / SPECIALTY LAST OV  Conard Novak, MD OB/GYN (Surgeon) 06/26/2023  Allegra Grana, FNP Primary Care Provider 05/01/2023  Debbe Odea, MD Cardiology 05/08/2023; update preop APP call on 06/30/2023  Steffanie Dunn, MD Electrophysiology 06/24/2023  Sheppard Plumber, FNP-C Vascular Surgery APP 05/15/2023   Allergies:  Codeine, Prednisone, Sulfa antibiotics, and Trimethoprim  Current Home Medications:   No current facility-administered medications for this encounter.    acetaminophen (TYLENOL) 500 MG tablet   albuterol (VENTOLIN HFA) 108 (90 Base) MCG/ACT inhaler   apixaban (ELIQUIS) 5 MG TABS tablet   buPROPion (WELLBUTRIN XL) 300 MG 24 hr tablet   Coenzyme Q10-Fish Oil-Vit E (CO-Q 10 OMEGA-3 FISH OIL PO)   metoprolol succinate (TOPROL-XL) 50 MG 24 hr tablet   Multiple Vitamins-Minerals (PRESERVISION AREDS 2 PO)   omeprazole (PRILOSEC) 20 MG capsule   History:   Past Medical History:  Diagnosis Date   Anginal pain (HCC)    Aortic atherosclerosis (HCC)    Aortic stenosis    a.) TTE 03/24/2023: mild AS (MPG 8; AVA 2.23)   Arthritis  Atrial fibrillation and flutter (HCC)    a.) CHA2DS2VASc = 5 (age, sex, HTN, vascular disease history, T2DM);  b.) rate/rhythm  maintained on oral metoprolol succinate; chronically anticoagulated with apixaban   Bilateral carotid artery disease (HCC) 04/23/2023   a.) carotid doppler 04/23/2023: < 50% BICA   Chronic kidney disease, stage 3 (HCC)    Chronic lower back pain    Coronary artery calcification seen on CT scan    DDD (degenerative disc disease), thoracic    Depression 06/08/2012   "related to not getting definitive dx of what's wrong"   Diabetes mellitus type 2, diet-controlled (HCC)    Diverticulosis    Exertional dyspnea    GERD (gastroesophageal reflux disease)    H/O hiatal hernia    History of stomach ulcers 1980's   Hyperlipidemia    Hypertension    Iron deficiency anemia    Long term current use of anticoagulant    a.) apixaban   Migraines    4-5x/yr   Morbid obesity with BMI of 40.0-44.9, adult (HCC)    Multiple renal cysts    Pneumonia    PONV (postoperative nausea and vomiting)    Seasonal asthma    Secondary hyperparathyroidism (HCC)    Sleep apnea 2013   "dx'd borderline"   Vertigo    Vulvar lesion (RIGHT)    Past Surgical History:  Procedure Laterality Date   APPENDECTOMY  1960's   BREAST BIOPSY Right    right   CATARACT EXTRACTION W/PHACO Left 11/01/2018   Procedure: CATARACT EXTRACTION PHACO AND INTRAOCULAR LENS PLACEMENT (IOC) LEFT;  Surgeon: Nevada Crane, MD;  Location: [facility];  Service: Ophthalmology;  Laterality: Left;   CATARACT EXTRACTION W/PHACO Right 11/29/2018   Procedure: CATARACT EXTRACTION PHACO AND INTRAOCULAR LENS PLACEMENT (IOC) RIGHT;  Surgeon: Nevada Crane, MD;  Location: [facility];  Service: Ophthalmology;  Laterality: Right;  Diabetes - diet conrolled sleep apnea   CHOLECYSTECTOMY  2006   COLONOSCOPY WITH PROPOFOL N/A 10/30/2020   Procedure: COLONOSCOPY WITH PROPOFOL;  Surgeon: Regis Bill, MD;  Location: [facility] END[facility];  Service: Endoscopy;  Laterality: N/A;   EXPLORATORY LAPAROTOMY  late 1990's   "blocked  colon S/P hernia repair"   HERNIA REPAIR     "@ least 3 abdominal hernia repairs"   LUMBAR DISC SURGERY  1981   ruptured disc   SHOULDER ARTHROSCOPY WITH ROTATOR CUFF REPAIR AND SUBACROMIAL DECOMPRESSION Right 05/25/2019   Procedure: Right shoulder mini open rotator cuff repair, subacromial decompression;  Surgeon: Jene Every, MD;  Location: [facility];  Service: Orthopedics;  Laterality: Right;  90 mins   VAGINAL HYSTERECTOMY  1981   Family History  Problem Relation Age of Onset   Arthritis Mother    Cancer Father        prostate   Hyperlipidemia Father    Alcohol abuse Father    Arthritis Father    Heart disease Father 28       paased away at 38 from MI   Stroke Father    Hypertension Father    Diabetes Father    Cancer Sister        ovary   Arthritis Sister    Parkinson's disease Sister    Cancer Sister        breast   Arthritis Sister    Breast cancer Sister 32   Heart disease Brother    Cancer Brother        thyroid   Arthritis Brother  Arthritis Maternal Grandmother    Arthritis Maternal Grandfather    Social History   Tobacco Use   Smoking status: Former    Current packs/day: 0.00    Average packs/day: 2.0 packs/day for 14.0 years (28.0 ttl pk-yrs)    Types: Cigarettes    Start date: 11/07/1961    Quit date: 11/08/1975    Years since quitting: 47.6    Passive exposure: Past   Smokeless tobacco: Never  Vaping Use   Vaping status: Never Used  Substance Use Topics   Alcohol use: No    Alcohol/week: 0.0 standard drinks of alcohol   Drug use: No    Pertinent Clinical Results:  LABS:   Lab Results  Component Value Date   WBC 8.4 03/23/2023   HGB 11.5 (L) 03/23/2023   HCT 35.8 (L) 03/23/2023   MCV 96.2 03/23/2023   PLT 252 03/23/2023   Lab Results  Component Value Date   NA 135 05/08/2023   K 4.2 05/08/2023   CO2 25 05/08/2023   GLUCOSE 102 (H) 05/08/2023   BUN 16 05/08/2023   CREATININE 1.30 (H) 05/08/2023   CALCIUM 9.2 05/08/2023   GFR  40.67 (L) 03/19/2022   GFRNONAA 43 (L) 05/08/2023    ECG: Date: 05/08/2023 Time ECG obtained: 1558 PM Rate: 78 bpm Rhythm: normal sinus Axis (leads I and aVF): Left axis deviation Intervals: PR 184 ms. QRS 114 ms. QTc 407 ms. ST segment and T wave changes: No evidence of acute ST segment elevation or depression.  Evidence of an age undetermined septal infarct present Comparison: Similar to previous tracing obtained on 03/23/2023   IMAGING / PROCEDURES: LONG TERM CARDIAC EVENT MONITOR STUDY performed on 05/06/2023 HR 63 - 154 bpm, average 84 bpm.  <1% atrial flutter, average rate 97 bpm. Rare supraventricular and ventricular ectopy. Cardiology consult placed for new diagnosis atrial flutter.  US CAROTID DUPLEX BILATERAL performed on 04/23/2023 Mild atherosclerotic disease of the bilateral carotid bulbs results in less than 50% stenosis by Doppler criteria bilaterally. In the left mid to distal carotid artery, there are relatively higher resistance waveforms noted. Findings may be artifactual, however raise possibility of downstream/intracranial disease. This may be further evaluated with CTA head and neck. Bilateral vertebral arteries demonstrate normal antegrade flow.  CT PELVIS W CONTRAST performed on 04/22/2023 Sigmoid diverticulosis.  Atheromatous changes.  No acute pathology identified.   TRANSTHORACIC ECHOCARDIOGRAM performed on 03/24/2023 Left ventricular ejection fraction, by estimation, is 65 to 70%. The left ventricle has normal function. The left ventricle has no regional  wall motion abnormalities. There is mild left ventricular hypertrophy.  Left ventricular diastolic parameters  are indeterminate.  Right ventricular systolic function is normal. The right ventricular size is normal. There is normal pulmonary artery systolic pressure.  The mitral valve is normal in structure. Trivial mitral valve regurgitation. No evidence of mitral stenosis.  The aortic valve is normal  in structure. Aortic valve regurgitation is not visualized. Mild aortic valve stenosis. Aortic valve area, by VTI measures 2.23 cm. Aortic valve mean gradient measures 8.0 mmHg.   CT ANGIO CHEST PE W AND/OR WO CONTRAST performed on 03/23/2023 Atheromatous calcifications of the aorta and coronary arteries No enlarged mediastinal, hilar, or axillary lymph nodes Numerous BILATERAL renal cysts were partially imaged with the largest measuring 2 cm  Impression and Plan:  Julie Rush has been referred for pre-anesthesia review and clearance prior to her undergoing the planned anesthetic and procedural courses. Available labs, pertinent testing, and imaging results were  personally reviewed by me in preparation for upcoming operative/procedural course. [facility] medical record has been updated following extensive record review and patient interview with PAT staff.   This patient has been appropriately cleared by cardiology with an overall ACCEPTABLE risk of experiencing significant perioperative cardiovascular complications. Based on clinical review performed today (07/06/23), barring any significant acute changes in the patient's overall condition, it is anticipated that she will be able to proceed with the planned surgical intervention. Any acute changes in clinical condition may necessitate her procedure being postponed and/or cancelled. Patient will meet with anesthesia team (MD and/or CRNA) on the day of her procedure for preoperative evaluation/assessment. Questions regarding anesthetic course will be fielded at that time.   Pre-surgical instructions were reviewed with the patient during her PAT appointment, and questions were fielded to satisfaction by PAT clinical staff. She has been instructed on which medications that she will need to hold prior to surgery, as well as the ones that have been deemed safe/appropriate to take on the day of her procedure. As part of the general education provided by  PAT, patient made aware both verbally and in writing, that she would need to abstain from the use of any illegal substances during her perioperative course.  She was advised that failure to follow the provided instructions could necessitate case cancellation or result in serious perioperative complications up to and including death. Patient encouraged to contact PAT and/or her surgeon's office to discuss any questions or concerns that may arise prior to surgery; verbalized understanding.   Quentin Mulling, MSN, APRN, FNP-C, CEN [facility]  Peri-operative Services Nurse Practitioner Phone: [phone] Fax: [phone] 07/06/23 5:11 PM  NOTE: This note has been prepared using Dragon dictation software. Despite my best ability to proofread, there is always the potential that unintentional transcriptional errors may still occur from this process.

## 2023-07-07 NOTE — Anesthesia Preprocedure Evaluation (Signed)
Anesthesia Evaluation  Patient identified by MRN, date of birth, ID band Patient awake    Reviewed: Allergy & Precautions, H&P , NPO status , Patient's Chart, lab work & pertinent test results  History of Anesthesia Complications (+) PONV and history of anesthetic complications  Airway Mallampati: II  TM Distance: >3 FB Neck ROM: full    Dental no notable dental hx.    Pulmonary asthma , sleep apnea , former smoker   Pulmonary exam normal        Cardiovascular hypertension, + angina  + CAD (Coronary artery calcification seen on CT scan), + Peripheral Vascular Disease (carotid doppler 04/23/2023: < 50% BICA) and + DOE  + dysrhythmias Atrial Fibrillation + Valvular Problems/Murmurs AS   TTE 03/24/2023: mild AS (MPG 8; AVA 2.23)   Neuro/Psych  PSYCHIATRIC DISORDERS Anxiety Depression    negative neurological ROS     GI/Hepatic Neg liver ROS, hiatal hernia,GERD  Medicated,,  Endo/Other  negative endocrine ROSdiabetes, Type 2    Renal/GU Renal InsufficiencyRenal disease  negative genitourinary   Musculoskeletal   Abdominal   Peds  Hematology negative hematology ROS (+)   Anesthesia Other Findings Past Medical History: No date: Anginal pain (HCC) No date: Aortic atherosclerosis (HCC) No date: Aortic stenosis     Comment:  a.) TTE 03/24/2023: mild AS (MPG 8; AVA 2.23) No date: Arthritis No date: Atrial fibrillation and flutter (HCC)     Comment:  a.) CHA2DS2VASc = 5 (age, sex, HTN, vascular disease               history, T2DM);  b.) rate/rhythm maintained on oral               metoprolol succinate; chronically anticoagulated with               apixaban 04/23/2023: Bilateral carotid artery disease (HCC)     Comment:  a.) carotid doppler 04/23/2023: < 50% BICA No date: Chronic kidney disease, stage 3 (HCC) No date: Chronic lower back pain No date: Coronary artery calcification seen on CT scan No date: DDD  (degenerative disc disease), thoracic 06/08/2012: Depression     Comment:  "related to not getting definitive dx of what's wrong" No date: Diabetes mellitus type 2, diet-controlled (HCC) No date: Diverticulosis No date: Exertional dyspnea No date: GERD (gastroesophageal reflux disease) No date: H/O hiatal hernia 1980's: History of stomach ulcers No date: Hyperlipidemia No date: Hypertension No date: Iron deficiency anemia No date: Long term current use of anticoagulant     Comment:  a.) apixaban No date: Migraines     Comment:  4-5x/yr No date: Morbid obesity with BMI of 40.0-44.9, adult (HCC) No date: Multiple renal cysts No date: Pneumonia No date: PONV (postoperative nausea and vomiting) No date: Seasonal asthma No date: Secondary hyperparathyroidism (HCC) 2013: Sleep apnea     Comment:  "dx'd borderline" No date: Vertigo No date: Vulvar lesion (RIGHT)  Past Surgical History: 1960's: APPENDECTOMY No date: BREAST BIOPSY; Right     Comment:  right 11/01/2018: CATARACT EXTRACTION W/PHACO; Left     Comment:  Procedure: CATARACT EXTRACTION PHACO AND INTRAOCULAR               LENS PLACEMENT (IOC) LEFT;  Surgeon: Nevada Crane,               MD;  Location: [facility];  Service:               Ophthalmology;  Laterality: Left; 11/29/2018: CATARACT  EXTRACTION W/PHACO; Right     Comment:  Procedure: CATARACT EXTRACTION PHACO AND INTRAOCULAR               LENS PLACEMENT (IOC) RIGHT;  Surgeon: Nevada Crane,              MD;  Location: [facility];  Service:               Ophthalmology;  Laterality: Right;  Diabetes - diet               conrolled sleep apnea 2006: CHOLECYSTECTOMY 10/30/2020: COLONOSCOPY WITH PROPOFOL; N/A     Comment:  Procedure: COLONOSCOPY WITH PROPOFOL;  Surgeon:               Regis Bill, MD;  Location: [facility];                Service: Endoscopy;  Laterality: N/A; late 1990's: EXPLORATORY LAPAROTOMY     Comment:   "blocked colon S/P hernia repair" No date: HERNIA REPAIR     Comment:  "@ least 3 abdominal hernia repairs" 1981: LUMBAR DISC SURGERY     Comment:  ruptured disc 05/25/2019: SHOULDER ARTHROSCOPY WITH ROTATOR CUFF REPAIR AND  SUBACROMIAL DECOMPRESSION; Right     Comment:  Procedure: Right shoulder mini open rotator cuff repair,              subacromial decompression;  Surgeon: Jene Every, MD;               Location: [facility];  Service: Orthopedics;  Laterality:               Right;  90 mins 1981: VAGINAL HYSTERECTOMY     Reproductive/Obstetrics negative OB ROS                              Anesthesia Physical Anesthesia Plan  ASA:   Anesthesia Plan: General   Post-op Pain Management:    Induction:   PONV Risk Score and Plan: Propofol infusion and TIVA  Airway Management Planned:   Additional Equipment:   Intra-op Plan:   Post-operative Plan:   Informed Consent:      Dental Advisory Given  Plan Discussed with: CRNA and Surgeon  Anesthesia Plan Comments:          Anesthesia Quick Evaluation

## 2023-07-08 ENCOUNTER — Other Ambulatory Visit: Payer: Self-pay

## 2023-07-08 ENCOUNTER — Ambulatory Visit
Admission: RE | Admit: 2023-07-08 | Discharge: 2023-07-08 | Disposition: A | Discharge status: Home / Self Care | Payer: Medicare Other | Attending: Obstetrics and Gynecology | Admitting: Obstetrics and Gynecology

## 2023-07-08 ENCOUNTER — Encounter: Payer: Self-pay | Admitting: Obstetrics and Gynecology

## 2023-07-08 ENCOUNTER — Encounter
Admission: RE | Disposition: A | Discharge status: Home / Self Care | Payer: Self-pay | Source: Home / Self Care | Attending: Obstetrics and Gynecology

## 2023-07-08 ENCOUNTER — Ambulatory Visit: Payer: Medicare Other | Admitting: Urgent Care

## 2023-07-08 DIAGNOSIS — I4891 Unspecified atrial fibrillation: Secondary | ICD-10-CM | POA: Insufficient documentation

## 2023-07-08 DIAGNOSIS — N907 Vulvar cyst: Secondary | ICD-10-CM | POA: Diagnosis not present

## 2023-07-08 DIAGNOSIS — N9089 Other specified noninflammatory disorders of vulva and perineum: Secondary | ICD-10-CM | POA: Diagnosis not present

## 2023-07-08 DIAGNOSIS — Z87891 Personal history of nicotine dependence: Secondary | ICD-10-CM | POA: Insufficient documentation

## 2023-07-08 DIAGNOSIS — Z7901 Long term (current) use of anticoagulants: Secondary | ICD-10-CM | POA: Diagnosis not present

## 2023-07-08 DIAGNOSIS — Z780 Asymptomatic menopausal state: Secondary | ICD-10-CM | POA: Diagnosis not present

## 2023-07-08 HISTORY — DX: Unspecified atrial fibrillation: I48.91

## 2023-07-08 HISTORY — DX: Congenital multiple renal cysts: Q61.02

## 2023-07-08 HISTORY — DX: Other specified noninflammatory disorders of vulva and perineum: N90.89

## 2023-07-08 HISTORY — DX: Other intervertebral disc degeneration, thoracic region: M51.34

## 2023-07-08 HISTORY — DX: Atherosclerotic heart disease of native coronary artery without angina pectoris: I25.10

## 2023-07-08 HISTORY — DX: Long term (current) use of anticoagulants: Z79.01

## 2023-07-08 HISTORY — PX: VULVECTOMY PARTIAL: SHX6187

## 2023-07-08 HISTORY — DX: Type 2 diabetes mellitus without complications: E11.9

## 2023-07-08 HISTORY — DX: Diverticulosis of intestine, part unspecified, without perforation or abscess without bleeding: K57.90

## 2023-07-08 HISTORY — DX: Atherosclerosis of aorta: I70.0

## 2023-07-08 SURGERY — VULVECTOMY, PARTIAL
Anesthesia: General | Site: Vulva

## 2023-07-08 MED ORDER — LIDOCAINE HCL (PF) 2 % IJ SOLN
INTRAMUSCULAR | Status: AC
  Filled 2023-07-08: qty 5

## 2023-07-08 MED ORDER — ACETAMINOPHEN 10 MG/ML IV SOLN
INTRAVENOUS | Status: DC | PRN
  Administered 2023-07-08: 1000 mg via INTRAVENOUS

## 2023-07-08 MED ORDER — BUPIVACAINE HCL 0.25 % IJ SOLN
INTRAMUSCULAR | Status: DC | PRN
  Administered 2023-07-08 (×2): 7 mL

## 2023-07-08 MED ORDER — ONDANSETRON HCL 4 MG/2ML IJ SOLN
INTRAMUSCULAR | Status: AC
  Filled 2023-07-08: qty 2

## 2023-07-08 MED ORDER — BUPIVACAINE LIPOSOME 1.3 % IJ SUSP
INTRAMUSCULAR | Status: DC | PRN
  Administered 2023-07-08: 7 mL

## 2023-07-08 MED ORDER — LIDOCAINE-EPINEPHRINE 1 %-1:100000 IJ SOLN
INTRAMUSCULAR | Status: AC
  Filled 2023-07-08: qty 1

## 2023-07-08 MED ORDER — DEXAMETHASONE SODIUM PHOSPHATE 4 MG/ML IJ SOLN
INTRAMUSCULAR | Status: DC | PRN
  Administered 2023-07-08: 5 mg via INTRAVENOUS

## 2023-07-08 MED ORDER — LIDOCAINE HCL (CARDIAC) PF 100 MG/5ML IV SOSY
PREFILLED_SYRINGE | INTRAVENOUS | Status: DC | PRN
  Administered 2023-07-08: 100 mg via INTRAVENOUS

## 2023-07-08 MED ORDER — FENTANYL CITRATE (PF) 100 MCG/2ML IJ SOLN
INTRAMUSCULAR | Status: AC
  Filled 2023-07-08: qty 2

## 2023-07-08 MED ORDER — PROPOFOL 10 MG/ML IV BOLUS
INTRAVENOUS | Status: AC
  Filled 2023-07-08: qty 20

## 2023-07-08 MED ORDER — BUPIVACAINE HCL (PF) 0.25 % IJ SOLN
INTRAMUSCULAR | Status: AC
  Filled 2023-07-08: qty 30

## 2023-07-08 MED ORDER — PROPOFOL 500 MG/50ML IV EMUL
INTRAVENOUS | Status: DC | PRN
  Administered 2023-07-08: 20 mg via INTRAVENOUS
  Administered 2023-07-08: 30 mg via INTRAVENOUS
  Administered 2023-07-08: 130 mg via INTRAVENOUS
  Administered 2023-07-08: 40 mg via INTRAVENOUS

## 2023-07-08 MED ORDER — SILVER NITRATE-POT NITRATE 75-25 % EX MISC
CUTANEOUS | Status: AC
  Filled 2023-07-08: qty 10

## 2023-07-08 MED ORDER — PHENYLEPHRINE HCL-NACL 20-0.9 MG/250ML-% IV SOLN
INTRAVENOUS | Status: DC | PRN
  Administered 2023-07-08: 30 ug/min via INTRAVENOUS

## 2023-07-08 MED ORDER — CHLORHEXIDINE GLUCONATE 0.12 % MT SOLN
15.0000 mL | Freq: Once | OROMUCOSAL | Status: AC
  Administered 2023-07-08: 15 mL via OROMUCOSAL

## 2023-07-08 MED ORDER — EPHEDRINE SULFATE (PRESSORS) 50 MG/ML IJ SOLN
INTRAMUSCULAR | Status: DC | PRN
  Administered 2023-07-08 (×5): 5 mg via INTRAVENOUS

## 2023-07-08 MED ORDER — ONDANSETRON HCL 4 MG/2ML IJ SOLN
INTRAMUSCULAR | Status: DC | PRN
  Administered 2023-07-08: 4 mg via INTRAVENOUS

## 2023-07-08 MED ORDER — LACTATED RINGERS IV SOLN: INTRAVENOUS | Status: DC

## 2023-07-08 MED ORDER — DEXAMETHASONE SODIUM PHOSPHATE 10 MG/ML IJ SOLN
INTRAMUSCULAR | Status: AC
  Filled 2023-07-08: qty 1

## 2023-07-08 MED ORDER — PHENYLEPHRINE HCL (PRESSORS) 10 MG/ML IV SOLN
INTRAVENOUS | Status: DC | PRN
  Administered 2023-07-08 (×4): 80 ug via INTRAVENOUS

## 2023-07-08 MED ORDER — HYDROMORPHONE HCL 1 MG/ML IJ SOLN
INTRAMUSCULAR | Status: DC | PRN
  Administered 2023-07-08: .3 mg via INTRAVENOUS
  Administered 2023-07-08: .2 mg via INTRAVENOUS

## 2023-07-08 MED ORDER — CHLORHEXIDINE GLUCONATE 0.12 % MT SOLN
OROMUCOSAL | Status: AC
  Filled 2023-07-08: qty 15

## 2023-07-08 MED ORDER — ORAL CARE MOUTH RINSE: 15.0000 mL | Freq: Once | OROMUCOSAL | Status: AC

## 2023-07-08 MED ORDER — HYDROMORPHONE HCL 1 MG/ML IJ SOLN
INTRAMUSCULAR | Status: AC
  Filled 2023-07-08: qty 1

## 2023-07-08 MED ORDER — LACTATED RINGERS IV SOLN: INTRAVENOUS | Status: DC | PRN

## 2023-07-08 MED ORDER — 0.9 % SODIUM CHLORIDE (POUR BTL) OPTIME
TOPICAL | Status: DC | PRN
  Administered 2023-07-08: 500 mL

## 2023-07-08 MED ORDER — FENTANYL CITRATE (PF) 100 MCG/2ML IJ SOLN
INTRAMUSCULAR | Status: DC | PRN
  Administered 2023-07-08 (×2): 50 ug via INTRAVENOUS

## 2023-07-08 SURGICAL SUPPLY — 23 items
CNTNR URN SCR LID CUP LEK RST (MISCELLANEOUS) IMPLANT
CONT SPEC 4OZ STRL OR WHT (MISCELLANEOUS) ×1
DRSG TELFA 3X8 NADH STRL (GAUZE/BANDAGES/DRESSINGS) IMPLANT
ELECT COATED BLADE 2.86 ST (ELECTRODE) IMPLANT
GLOVE BIO SURGEON STRL SZ7 (GLOVE) ×1 IMPLANT
GOWN STRL REUS W/ TWL LRG LVL3 (GOWN DISPOSABLE) ×1 IMPLANT
GOWN STRL REUS W/TWL LRG LVL3 (GOWN DISPOSABLE) ×1
HANDLE YANKAUER SUCT BULB TIP (MISCELLANEOUS) IMPLANT
KIT TURNOVER CYSTO (KITS) ×1 IMPLANT
LIGASURE IMPACT 36 18CM CVD LR (INSTRUMENTS) IMPLANT
MANIFOLD NEPTUNE II (INSTRUMENTS) ×1 IMPLANT
NS IRRIG 500ML POUR BTL (IV SOLUTION) ×1 IMPLANT
PACK DNC HYST (MISCELLANEOUS) ×1 IMPLANT
PAD OB MATERNITY 4.3X12.25 (PERSONAL CARE ITEMS) ×1 IMPLANT
PAD PREP OB/GYN DISP 24X41 (PERSONAL CARE ITEMS) ×1 IMPLANT
PENCIL SMOKE EVACUATOR (MISCELLANEOUS) IMPLANT
SCRUB CHG 4% DYNA-HEX 4OZ (MISCELLANEOUS) ×1 IMPLANT
SET CYSTO W/LG BORE CLAMP LF (SET/KITS/TRAYS/PACK) IMPLANT
SUT VIC AB 2-0 SH 27 (SUTURE) ×10
SUT VIC AB 2-0 SH 27XBRD (SUTURE) IMPLANT
SYR BULB IRRIG 60ML STRL (SYRINGE) IMPLANT
TRAP FLUID SMOKE EVACUATOR (MISCELLANEOUS) ×1 IMPLANT
WATER STERILE IRR 500ML POUR (IV SOLUTION) ×1 IMPLANT

## 2023-07-08 NOTE — Op Note (Signed)
  Operative Note   02/20/2016 10:25 AM  PRE-OP DIAGNOSIS: Right vulvar mass   POST-OP DIAGNOSIS: Same  INTRAOPERATIVE CONSULT: Surgeon(s) and Role:    * Lashundra Shiveley Leta Jungling, MD - Primary  ANESTHESIA: General   PROCEDURE: Procedure(s): VULVECTOMY PARTIAL   INDICATIONS: Dr. Jean Rosenthal asked me to see the patient given the vascular right sided vulvar mass.  Intraoperative consultation was performed revealing a right sided firm subcutaneous mass measuring approximately 2 x 1 cm.  There appeared to be hypervascularity with enlarged venous plexus.  With inclusion of the venous plexus the lesion was approximately 3 x 3 cm.  I recommended a partial valvectomy with 5 mm margins and use of the LigaSure device to reduce bleeding.  I assisted Dr. Jean Rosenthal with the procedure.  Please refer to his operative note for complete detail.     ASSESSMENT and PLAN: Postmenopausal female with abnormal right vulvar hypervascular subcutaneous mass.  Recommendation for right partial simple vulvectomy follow-up pathology.  Moderate medical decision making approximately 40 minutes was spent in consultation.  Tell Rozelle Leta Jungling, MD   Artelia Laroche, MD

## 2023-07-08 NOTE — Op Note (Signed)
  Operative Note    Name: Julie Rush  Date of Service: 07/08/2023  DOB: 08-29-1951  MRN: [id number]   Pre-Operative Diagnosis: Right vulvar lesion  Post-Operative Diagnosis: Right vulvar lesion  Procedures:  Simple right vulvectomy  Primary Surgeon: Thomasene Mohair, MD  Assistant Surgeon: Evelena Asa, MD (consultant for Gynecologic Oncology)   EBL: 5 mL   IVF: 400 mL   Urine output: 250 mL  Specimens: Right vulva, stitch at 12 o'clock  Drains: None  Complications: None   Disposition: PACU   Condition: Stable   Findings: Approximately 3 x 4 cm right vulvar lesion that had characteristics that appeared vascular with purple and tan coloration. Palpated depth of about 4-5 mm.    Procedure Summary:  The patient was taken to the operating room where general anesthesia was administered and found to be adequate.  She was positioned in the dorsal supine lithotomy position in [place] stirrups.  Her vulva and vagina was prepped in the usual sterile fashion.  She was then draped in the sterile fashion.  An exam under anesthesia was performed by me and Dr. Sonia Side with the above-noted findings.  Given the uncertain significance of the lesion, Dr. Sonia Side recommended an approximately 5 mm margin of removal, which she delineated using a marker.  At this point the edges of the incision were injected with quarter percent Marcaine plain.  A scalpel was used to excise the skin along the marked desired margin approximately 6.5 x 3.5 cm in a diamond-shaped pattern.  Cauterization was used to gain depth from the skin approximately 7 to 8 mm.  The LigaSure device was used to cauterize and transect the subcutaneous tissue until the specimen was completely removed.  Palpating of the lesion was performed along the way to ensure the complete depth was obtained.  Once the specimen was removed a stitch was placed at 12:00 for orientation purposes.  The vulvectomy bed was made hemostatic using  electrocautery.  The excision bed was copiously irrigated using sterile saline.  The deep tissue was really approximated using 2-0 Vicryl.  The skin was reapproximated using 2-0 Vicryl using interrupted vertical mattress sutures.  The total defect measured approximately 6.5 cm at the end of the case.  Hemostasis was obtained.  An additional 14 mL of Exparel mixed with quarter percent Marcaine was injected along the lateral aspects of the incision closure for pain control.  Again, hemostasis was noted at this point and the procedure was terminated.  Dr. Sonia Side was essential to this case in assessing the nature of the lesion and making recommendations for removal.  She assisted in outlining the lesion and in retraction of the specimen as the specimen was being removed.  Additionally, she performed part of the removal of the deep tissue to ensure that adequate margins were obtained.  The patient tolerated the procedure well.  Sponge, lap, needle, and instrument counts were correct x 2.  VTE prophylaxis: SCDs. Antibiotic prophylaxis: none indicated. She was awakened in the operating room and was taken to the PACU in stable condition. The assistant surgeon was a Gynecologic Oncologist MD due to lack of availability of another Sales promotion account executive.   Thomasene Mohair, MD 07/08/2023 9:03 AM

## 2023-07-08 NOTE — Transfer of Care (Signed)
Immediate Anesthesia Transfer of Care Note  Patient: Julie Rush  Procedure(s) Performed: VULVAR LESION, BIOPSY/REMOVAL (Vulva) EXAM UNDER ANESTHESIA (Vulva)  Patient Location: PACU  Anesthesia Type:General  Level of Consciousness: awake  Airway & Oxygen Therapy: Patient Spontanous Breathing and Patient connected to face mask oxygen  Post-op Assessment: Report given to RN and Post -op Vital signs reviewed and stable  Post vital signs: Reviewed  Last Vitals:  Vitals Value Taken Time  BP 138/68 07/08/23 0915  Temp 51F   Pulse 73 07/08/23 0917  Resp 10 07/08/23 0917  SpO2 98 % 07/08/23 0917  Vitals shown include unfiled device data.  Last Pain:  Vitals:   07/08/23 0627  PainSc: 0-No pain         Complications: No notable events documented.

## 2023-07-08 NOTE — Anesthesia Procedure Notes (Signed)
Procedure Name: LMA Insertion Date/Time: 07/08/2023 7:46 AM  Performed by: Merlene Pulling, CRNAPre-anesthesia Checklist: Patient identified, Patient being monitored, Timeout performed, Emergency Drugs available and Suction available Patient Re-evaluated:Patient Re-evaluated prior to induction Oxygen Delivery Method: Circle system utilized Preoxygenation: Pre-oxygenation with 100% oxygen Induction Type: IV induction Ventilation: Mask ventilation without difficulty LMA: LMA inserted Laryngoscope Size: 4 Tube type: Oral Number of attempts: 1 Placement Confirmation: positive ETCO2 and breath sounds checked- equal and bilateral Tube secured with: Tape Dental Injury: Teeth and Oropharynx as per pre-operative assessment

## 2023-07-08 NOTE — Interval H&P Note (Signed)
History and Physical Interval Note:  07/08/2023 7:09 AM  Julie Rush  has presented today for surgery, with the diagnosis of right vulvar lesion.  The various methods of treatment have been discussed with the patient and family. After consideration of risks, benefits and other options for treatment, the patient has consented to  Procedure(s): VULVAR LESION, BIOPSY/REMOVAL (N/A) EXAM UNDER ANESTHESIA (N/A) as a surgical intervention.  The patient's history has been reviewed, patient examined, no change in status, stable for surgery.  I have reviewed the patient's chart and labs.  Questions were answered to the patient's satisfaction.  Consents reviewed and signed. Patient wishes to proceed.  Thomasene Mohair, MD, [facility] 07/08/2023 7:10 AM

## 2023-07-08 NOTE — Discharge Instructions (Addendum)
AMBULATORY SURGERY  DISCHARGE INSTRUCTIONS   The drugs that you were given will stay in your system until tomorrow so for the next 24 hours you should not:  Drive an automobile Make any legal decisions Drink any alcoholic beverage   You may resume regular meals tomorrow.  Today it is better to start with liquids and gradually work up to solid foods.  You may eat anything you prefer, but it is better to start with liquids, then soup and crackers, and gradually work up to solid foods.   Please notify your doctor immediately if you have any unusual bleeding, trouble breathing, redness and pain at the surgery site, drainage, fever, or pain not relieved by medication.  Information for Discharge Teaching:  PLEASE DO NOT REMOVE TEAL EXPAREL BRACELET  FOR 4 days, (96 hours) 07/12/2023 EXPAREL (bupivacaine liposome injectable suspension)   Your surgeon or anesthesiologist gave you EXPAREL(bupivacaine) to help control your pain after surgery.  EXPAREL is a local anesthetic that provides pain relief by numbing the tissue around the surgical site. EXPAREL is designed to release pain medication over time and can control pain for up to 72 hours. Depending on how you respond to EXPAREL, you may require less pain medication during your recovery.  Possible side effects: Temporary loss of sensation or ability to move in the area where bupivacaine was injected. Nausea, vomiting, constipation Rarely, numbness and tingling in your mouth or lips, lightheadedness, or anxiety may occur. Call your doctor right away if you think you may be experiencing any of these sensations, or if you have other questions regarding possible side effects.  Follow all other discharge instructions given to you by your surgeon or nurse. Eat a healthy diet and drink plenty of water or other fluids.  If you return to the hospital for any reason within 96 hours following the administration of EXPAREL, it is important for health  care providers to know that you have received this anesthetic. A teal colored band has been placed on your arm with the date, time and amount of EXPAREL you have received in order to alert and inform your health care providers. Please leave this armband in place for the full 96 hours following administration, and then you may remove the band.    Please contact your physician with any problems or Same Day Surgery at [phone], Monday through Friday 6 am to 4 pm, or [place] at [facility] number at [phone].

## 2023-07-09 ENCOUNTER — Encounter: Payer: Self-pay | Admitting: Obstetrics and Gynecology

## 2023-07-09 NOTE — Anesthesia Postprocedure Evaluation (Signed)
Anesthesia Post Note  Patient: Julie Rush  Procedure(s) Performed: VULVAR LESION, BIOPSY/REMOVAL (Vulva) EXAM UNDER ANESTHESIA (Vulva)  Patient location during evaluation: PACU Anesthesia Type: General Level of consciousness: awake and alert Pain management: pain level controlled Vital Signs Assessment: post-procedure vital signs reviewed and stable Respiratory status: spontaneous breathing, nonlabored ventilation and respiratory function stable Cardiovascular status: blood pressure returned to baseline and stable Postop Assessment: no apparent nausea or vomiting Anesthetic complications: no   No notable events documented.   Last Vitals:  Vitals:   07/08/23 0945 07/08/23 0959  BP: 120/64 134/70  Pulse: 68 68  Resp: 17 16  Temp: (!) 36.3 C   SpO2: 100% 100%    Last Pain:  Vitals:   07/08/23 0959  TempSrc: Temporal  PainSc:                  Foye Deer

## 2023-07-16 DIAGNOSIS — Z860100 Personal history of colon polyps, unspecified: Secondary | ICD-10-CM | POA: Insufficient documentation

## 2023-07-16 DIAGNOSIS — K2100 Gastro-esophageal reflux disease with esophagitis, without bleeding: Secondary | ICD-10-CM | POA: Insufficient documentation

## 2023-07-16 DIAGNOSIS — J309 Allergic rhinitis, unspecified: Secondary | ICD-10-CM | POA: Insufficient documentation

## 2023-07-16 DIAGNOSIS — K5730 Diverticulosis of large intestine without perforation or abscess without bleeding: Secondary | ICD-10-CM | POA: Insufficient documentation

## 2023-07-16 DIAGNOSIS — G43909 Migraine, unspecified, not intractable, without status migrainosus: Secondary | ICD-10-CM | POA: Insufficient documentation

## 2023-07-16 DIAGNOSIS — E559 Vitamin D deficiency, unspecified: Secondary | ICD-10-CM | POA: Insufficient documentation

## 2023-07-17 ENCOUNTER — Encounter: Payer: Self-pay | Admitting: Obstetrics and Gynecology

## 2023-07-26 ENCOUNTER — Encounter: Payer: Self-pay | Admitting: Family

## 2023-07-27 ENCOUNTER — Other Ambulatory Visit: Payer: Self-pay

## 2023-07-27 DIAGNOSIS — F411 Generalized anxiety disorder: Secondary | ICD-10-CM

## 2023-07-27 MED ORDER — BUPROPION HCL ER (XL) 300 MG PO TB24
ORAL_TABLET | ORAL | 1 refills | Status: DC
Start: 2023-07-27 — End: 2023-11-10

## 2023-08-13 ENCOUNTER — Other Ambulatory Visit (INDEPENDENT_AMBULATORY_CARE_PROVIDER_SITE_OTHER): Payer: Self-pay | Admitting: Nurse Practitioner

## 2023-08-13 DIAGNOSIS — I6523 Occlusion and stenosis of bilateral carotid arteries: Secondary | ICD-10-CM

## 2023-08-18 ENCOUNTER — Ambulatory Visit (INDEPENDENT_AMBULATORY_CARE_PROVIDER_SITE_OTHER): Payer: Medicare Other | Admitting: Vascular Surgery

## 2023-08-18 ENCOUNTER — Ambulatory Visit (INDEPENDENT_AMBULATORY_CARE_PROVIDER_SITE_OTHER): Payer: Medicare Other

## 2023-08-18 ENCOUNTER — Encounter (INDEPENDENT_AMBULATORY_CARE_PROVIDER_SITE_OTHER): Payer: Self-pay | Admitting: Vascular Surgery

## 2023-08-18 VITALS — BP 123/72 | HR 66 | Resp 16 | Wt 251.0 lb

## 2023-08-18 DIAGNOSIS — I6523 Occlusion and stenosis of bilateral carotid arteries: Secondary | ICD-10-CM | POA: Diagnosis not present

## 2023-08-18 DIAGNOSIS — E785 Hyperlipidemia, unspecified: Secondary | ICD-10-CM

## 2023-08-18 DIAGNOSIS — I10 Essential (primary) hypertension: Secondary | ICD-10-CM | POA: Diagnosis not present

## 2023-08-18 DIAGNOSIS — E119 Type 2 diabetes mellitus without complications: Secondary | ICD-10-CM | POA: Diagnosis not present

## 2023-08-18 NOTE — Progress Notes (Signed)
MRN : [id number]  Julie Rush is a 72 y.o. ([birth year]) female who presents with chief complaint of  Chief Complaint  Patient presents with   Follow-up    Ultrasound follow up  .  History of Present Illness: Patient returns in follow-up of her carotid disease.  She is doing well today.  She denies any focal neurologic symptoms. Specifically, the patient denies amaurosis fugax, speech or swallowing difficulties, or arm or leg weakness or numbness.  Carotid duplex today demonstrates 1 to 39% ICA stenosis bilaterally.  Her original study quoted less than 50% stenosis but also raised the question of artifact versus high resistance waveforms for more significant disease and the radiologist recommended CT angiogram.  She had had a CT scan and did not want to have another study at that time so we followed it with short oval follow-up duplex.  Current Outpatient Medications  Medication Sig Dispense Refill   acetaminophen (TYLENOL) 500 MG tablet Take 1,000 mg by mouth every 6 (six) hours as needed for moderate pain or headache.      albuterol (VENTOLIN HFA) 108 (90 Base) MCG/ACT inhaler INHALE 2 PUFFS BY MOUTH EVERY 6 HOURS AS NEEDED FOR WHEEZE OR SHORTNESS OF BREATH 6.7 each 1   apixaban (ELIQUIS) 5 MG TABS tablet Take 1 tablet (5 mg total) by mouth 2 (two) times daily. 60 tablet 3   buPROPion (WELLBUTRIN XL) 300 MG 24 hr tablet Take 1 tablet 300 mg by mouth every morning 90 tablet 1   Coenzyme Q10-Fish Oil-Vit E (CO-Q 10 OMEGA-3 FISH OIL PO) Take 1 capsule by mouth daily.     metoprolol succinate (TOPROL-XL) 50 MG 24 hr tablet Take 50 mg by mouth daily. Take with or immediately following a meal.     Multiple Vitamins-Minerals (PRESERVISION AREDS 2 PO) Take 1 capsule by mouth 2 (two) times daily.     omeprazole (PRILOSEC) 20 MG capsule Take 20 mg by mouth daily as needed (heartburn).      No current facility-administered medications for this visit.    Past Medical History:  Diagnosis Date    Anginal pain (HCC)    Aortic atherosclerosis (HCC)    Aortic stenosis    a.) TTE 03/24/2023: mild AS (MPG 8; AVA 2.23)   Arthritis    Atrial fibrillation and flutter (HCC)    a.) CHA2DS2VASc = 5 (age, sex, HTN, vascular disease history, T2DM);  b.) rate/rhythm maintained on oral metoprolol succinate; chronically anticoagulated with apixaban   Bilateral carotid artery disease (HCC) 04/23/2023   a.) carotid doppler 04/23/2023: < 50% BICA   Chronic kidney disease, stage 3 (HCC)    Chronic lower back pain    Coronary artery calcification seen on CT scan    DDD (degenerative disc disease), thoracic    Depression 06/08/2012   "related to not getting definitive dx of what's wrong"   Diabetes mellitus type 2, diet-controlled (HCC)    Diverticulosis    Exertional dyspnea    GERD (gastroesophageal reflux disease)    H/O hiatal hernia    History of stomach ulcers 1980's   Hyperlipidemia    Hypertension    Iron deficiency anemia    Long term current use of anticoagulant    a.) apixaban   Migraines    4-5x/yr   Morbid obesity with BMI of 40.0-44.9, adult (HCC)    Multiple renal cysts    Pneumonia    PONV (postoperative nausea and vomiting)    Seasonal asthma  Secondary hyperparathyroidism (HCC)    Sleep apnea 2013   "dx'd borderline"   Vertigo    Vulvar lesion (RIGHT)     Past Surgical History:  Procedure Laterality Date   APPENDECTOMY  1960's   BREAST BIOPSY Right    right   CATARACT EXTRACTION W/PHACO Left 11/01/2018   Procedure: CATARACT EXTRACTION PHACO AND INTRAOCULAR LENS PLACEMENT (IOC) LEFT;  Surgeon: Nevada Crane, MD;  Location: [facility];  Service: Ophthalmology;  Laterality: Left;   CATARACT EXTRACTION W/PHACO Right 11/29/2018   Procedure: CATARACT EXTRACTION PHACO AND INTRAOCULAR LENS PLACEMENT (IOC) RIGHT;  Surgeon: Nevada Crane, MD;  Location: [facility];  Service: Ophthalmology;  Laterality: Right;  Diabetes - diet conrolled sleep  apnea   CHOLECYSTECTOMY  2006   COLONOSCOPY WITH PROPOFOL N/A 10/30/2020   Procedure: COLONOSCOPY WITH PROPOFOL;  Surgeon: Regis Bill, MD;  Location: [facility];  Service: Endoscopy;  Laterality: N/A;   EXPLORATORY LAPAROTOMY  late 1990's   "blocked colon S/P hernia repair"   HERNIA REPAIR     "@ least 3 abdominal hernia repairs"   LUMBAR DISC SURGERY  1981   ruptured disc   SHOULDER ARTHROSCOPY WITH ROTATOR CUFF REPAIR AND SUBACROMIAL DECOMPRESSION Right 05/25/2019   Procedure: Right shoulder mini open rotator cuff repair, subacromial decompression;  Surgeon: Jene Every, MD;  Location: [facility];  Service: Orthopedics;  Laterality: Right;  90 mins   VAGINAL HYSTERECTOMY  1981   VULVECTOMY PARTIAL N/A 07/08/2023   Procedure: VULVAR LESION, BIOPSY/REMOVAL;  Surgeon: Conard Novak, MD;  Location: [facility];  Service: Gynecology;  Laterality: N/A;     Social History   Tobacco Use   Smoking status: Former    Current packs/day: 0.00    Average packs/day: 2.0 packs/day for 14.0 years (28.0 ttl pk-yrs)    Types: Cigarettes    Start date: 11/07/1961    Quit date: 11/08/1975    Years since quitting: 47.8    Passive exposure: Past   Smokeless tobacco: Never  Vaping Use   Vaping status: Never Used  Substance Use Topics   Alcohol use: No    Alcohol/week: 0.0 standard drinks of alcohol   Drug use: No      Family History  Problem Relation Age of Onset   Arthritis Mother    Cancer Father        prostate   Hyperlipidemia Father    Alcohol abuse Father    Arthritis Father    Heart disease Father 50       paased away at 51 from MI   Stroke Father    Hypertension Father    Diabetes Father    Cancer Sister        ovary   Arthritis Sister    Parkinson's disease Sister    Cancer Sister        breast   Arthritis Sister    Breast cancer Sister 60   Heart disease Brother    Cancer Brother        thyroid   Arthritis Brother    Arthritis Maternal Grandmother     Arthritis Maternal Grandfather      Allergies  Allergen Reactions   Codeine Nausea And Vomiting   Prednisone Palpitations   Sulfa Antibiotics Itching and Rash   Trimethoprim Hives     REVIEW OF SYSTEMS (Negative unless checked)  Constitutional: [] Weight loss  [] Fever  [] Chills Cardiac: [] Chest pain   [] Chest pressure   [] Palpitations   [] Shortness of  breath when laying flat   [] Shortness of breath at rest   [x] Shortness of breath with exertion. Vascular:  [] Pain in legs with walking   [] Pain in legs at rest   [] Pain in legs when laying flat   [] Claudication   [] Pain in feet when walking  [] Pain in feet at rest  [] Pain in feet when laying flat   [] History of DVT   [] Phlebitis   [] Swelling in legs   [] Varicose veins   [] Non-healing ulcers Pulmonary:   [] Uses home oxygen   [] Productive cough   [] Hemoptysis   [] Wheeze  [] COPD   [] Asthma Neurologic:  [x] Dizziness  [] Blackouts   [] Seizures   [] History of stroke   [] History of TIA  [] Aphasia   [] Temporary blindness   [] Dysphagia   [] Weakness or numbness in arms   [] Weakness or numbness in legs Musculoskeletal:  [x] Arthritis   [] Joint swelling   [x] Joint pain   [] Low back pain Hematologic:  [] Easy bruising  [] Easy bleeding   [] Hypercoagulable state   [x] Anemic  [] Hepatitis Gastrointestinal:  [] Blood in stool   [] Vomiting blood  [x] Gastroesophageal reflux/heartburn   [] Difficulty swallowing. Genitourinary:  [] Chronic kidney disease   [] Difficult urination  [] Frequent urination  [] Burning with urination   [] Blood in urine Skin:  [] Rashes   [] Ulcers   [] Wounds Psychological:  [] History of anxiety   []  History of major depression.  Physical Examination  Vitals:   08/18/23 1447  BP: 123/72  Pulse: 66  Resp: 16  Weight: 251 lb (113.9 kg)   Body mass index is 41.77 kg/m. Gen:  WD/WN, NAD.  Obese Head: [place]/AT, No temporalis wasting. Ear/Nose/Throat: Hearing grossly intact, nares w/o erythema or drainage, trachea midline Eyes: Conjunctiva  clear. Sclera non-icteric Neck: Supple.  No bruit  Pulmonary:  Good air movement, equal and clear to auscultation bilaterally.  Cardiac: irregular Vascular:  Vessel Right Left  Radial Palpable Palpable       Musculoskeletal: M/S 5/5 throughout.  No deformity or atrophy.  Walks with a cane. Neurologic: CN 2-12 intact. Sensation grossly intact in extremities.  Symmetrical.  Speech is fluent. Motor exam as listed above. Psychiatric: Judgment intact, Mood & affect appropriate for pt's clinical situation. Dermatologic: No rashes or ulcers noted.  No cellulitis or open wounds.    CBC Lab Results  Component Value Date   WBC 8.4 03/23/2023   HGB 11.5 (L) 03/23/2023   HCT 35.8 (L) 03/23/2023   MCV 96.2 03/23/2023   PLT 252 03/23/2023    BMET    Component Value Date/Time   NA 135 05/08/2023 1445   NA 139 08/26/2013 0000   K 4.2 05/08/2023 1445   CL 101 05/08/2023 1445   CO2 25 05/08/2023 1445   GLUCOSE 102 (H) 05/08/2023 1445   BUN 16 05/08/2023 1445   BUN 17 08/26/2013 0000   CREATININE 1.30 (H) 05/08/2023 1445   CREATININE 1.08 (H) 12/07/2020 1445   CALCIUM 9.2 05/08/2023 1445   GFRNONAA 43 (L) 05/08/2023 1445   GFRAA >60 05/26/2019 0439   CrCl cannot be calculated (Patient's most recent lab result is older than the maximum 21 days allowed.).  COAG Lab Results  Component Value Date   INR 0.9 02/11/2019    Radiology No results found.   Assessment/Plan Essential hypertension, benign blood pressure control important in reducing the progression of atherosclerotic disease. On appropriate oral medications.   Diabetes mellitus type 2, diet-controlled (HCC) blood glucose control important in reducing the progression of atherosclerotic disease. Also, involved in wound  healing. On appropriate medications.   Bilateral carotid artery disease (HCC) Carotid duplex today demonstrates 1 to 39% ICA stenosis bilaterally.  Her original study quoted less than 50% stenosis but  also raised the question of artifact versus high resistance waveforms for more significant disease and the radiologist recommended CT angiogram.  She had had a CT scan relatively recently at that time and did not want to have another study at that time so we followed it with short oval follow-up duplex.  With this study being stable and showing only mild disease with a very experienced ultrasound technologist, I feel comfortable in managing this medically.  We will follow her up in 6 months for a somewhat shortened follow-up of her standard for mild disease due to the previous findings.  She will contact our office with problems in the interim.    Festus Barren, MD  08/18/2023 3:30 PM    This note was created with Dragon medical transcription system.  Any errors from dictation are purely unintentional

## 2023-08-18 NOTE — Assessment & Plan Note (Signed)
Carotid duplex today demonstrates 1 to 39% ICA stenosis bilaterally.  Her original study quoted less than 50% stenosis but also raised the question of artifact versus high resistance waveforms for more significant disease and the radiologist recommended CT angiogram.  She had had a CT scan relatively recently at that time and did not want to have another study at that time so we followed it with short oval follow-up duplex.  With this study being stable and showing only mild disease with a very experienced ultrasound technologist, I feel comfortable in managing this medically.  We will follow her up in 6 months for a somewhat shortened follow-up of her standard for mild disease due to the previous findings.  She will contact our office with problems in the interim.

## 2023-08-18 NOTE — Assessment & Plan Note (Signed)
blood pressure control important in reducing the progression of atherosclerotic disease. On appropriate oral medications.  

## 2023-08-18 NOTE — Assessment & Plan Note (Signed)
blood glucose control important in reducing the progression of atherosclerotic disease. Also, involved in wound healing. On appropriate medications.  

## 2023-08-23 ENCOUNTER — Other Ambulatory Visit: Payer: Self-pay | Admitting: Family

## 2023-08-23 DIAGNOSIS — J209 Acute bronchitis, unspecified: Secondary | ICD-10-CM

## 2023-08-25 DIAGNOSIS — N9089 Other specified noninflammatory disorders of vulva and perineum: Secondary | ICD-10-CM | POA: Diagnosis not present

## 2023-09-08 ENCOUNTER — Ambulatory Visit: Payer: Medicare Other | Attending: Cardiology | Admitting: Cardiology

## 2023-09-08 ENCOUNTER — Encounter: Payer: Self-pay | Admitting: Cardiology

## 2023-09-08 VITALS — BP 138/86 | HR 64 | Ht 65.0 in | Wt 255.0 lb

## 2023-09-08 DIAGNOSIS — I4892 Unspecified atrial flutter: Secondary | ICD-10-CM | POA: Insufficient documentation

## 2023-09-08 DIAGNOSIS — I10 Essential (primary) hypertension: Secondary | ICD-10-CM | POA: Insufficient documentation

## 2023-09-08 NOTE — Patient Instructions (Signed)
Medication Instructions:   Your physician recommends that you continue on your current medications as directed. Please refer to the Current Medication list given to you today.  *If you need a refill on your cardiac medications before your next appointment, please call your pharmacy*   Lab Work:  None Ordered  If you have labs (blood work) drawn today and your tests are completely normal, you will receive your results only by: MyChart Message (if you have MyChart) OR A paper copy in the mail If you have any lab test that is abnormal or we need to change your treatment, we will call you to review the results.   Testing/Procedures:  None Ordered    Follow-Up: At [facility], you and your health needs are our priority.  As part of our continuing mission to provide you with exceptional heart care, we have created designated Provider Care Teams.  These Care Teams include your primary Cardiologist (physician) and Advanced Practice Providers (APPs -  Physician Assistants and Nurse Practitioners) who all work together to provide you with the care you need, when you need it.  We recommend signing up for the patient portal called "MyChart".  Sign up information is provided on this After Visit Summary.  MyChart is used to connect with patients for Virtual Visits (Telemedicine).  Patients are able to view lab/test results, encounter notes, upcoming appointments, etc.  Non-urgent messages can be sent to your provider as well.   To learn more about what you can do with MyChart, go to [email or web address].    Your next appointment:   12 month(s)  Provider:   You may see Brian Agbor-Etang, MD or one of the following Advanced Practice Providers on your designated Care Team:   Christopher Berge, NP Ryan Dunn, PA-C Cadence Furth, PA-C Sheri Hammock, NP  

## 2023-09-08 NOTE — Progress Notes (Signed)
Cardiology Office Note:    Date:  09/08/2023   ID:  Julie Rush, DOB 1951-01-01, MRN [id number]  PCP:  Allegra Grana, FNP   [facility] Providers Cardiologist:  Debbe Odea, MD Electrophysiologist:  Lanier Prude, MD     Referring MD: Allegra Grana, FNP   Chief Complaint  Patient presents with   Follow-up    Patient denies new or acute cardiac problems/concerns today.      History of Present Illness:    Julie Rush is a 72 y.o. female with a hx of paroxysmal atrial flutter, GERD, CKD 3 who presents for follow-up.    Last seen due to atrial flutter diagnosed after symptoms of dizziness led to cardiac monitor being placed.  Monitor revealed paroxysmal atrial flutter less than 1%.  Eliquis, Toprol-XL 50 mg daily was started.  Did not start Eliquis due to concerns for renal dysfunction.  Followed up with EP about 2 months later, encouraged to take Eliquis as prescribed.  Has been taking Eliquis since.  Feels well, has very rare palpitations, nothing significant.  Tolerating medications as prescribed, no bleeding issues.  Prior notes/testing Cardiac monitor 05/06/2023 showed paroxysmal atrial flutter less than 1%. Echocardiogram 03/2023 EF 65 to 70%.  Mild aortic stenosis. Carotid ultrasound 04/23/2023 mild nonobstructive bilateral carotid stenosis   Past Medical History:  Diagnosis Date   Anginal pain (HCC)    Aortic atherosclerosis (HCC)    Aortic stenosis    a.) TTE 03/24/2023: mild AS (MPG 8; AVA 2.23)   Arthritis    Atrial fibrillation and flutter (HCC)    a.) CHA2DS2VASc = 5 (age, sex, HTN, vascular disease history, T2DM);  b.) rate/rhythm maintained on oral metoprolol succinate; chronically anticoagulated with apixaban   Bilateral carotid artery disease (HCC) 04/23/2023   a.) carotid doppler 04/23/2023: < 50% BICA   Chronic kidney disease, stage 3 (HCC)    Chronic lower back pain    Coronary artery calcification seen on CT scan    DDD  (degenerative disc disease), thoracic    Depression 06/08/2012   "related to not getting definitive dx of what's wrong"   Diabetes mellitus type 2, diet-controlled (HCC)    Diverticulosis    Exertional dyspnea    GERD (gastroesophageal reflux disease)    H/O hiatal hernia    History of stomach ulcers 1980's   Hyperlipidemia    Hypertension    Iron deficiency anemia    Long term current use of anticoagulant    a.) apixaban   Migraines    4-5x/yr   Morbid obesity with BMI of 40.0-44.9, adult (HCC)    Multiple renal cysts    Pneumonia    PONV (postoperative nausea and vomiting)    Seasonal asthma    Secondary hyperparathyroidism (HCC)    Sleep apnea 2013   "dx'd borderline"   Vertigo    Vulvar lesion (RIGHT)     Past Surgical History:  Procedure Laterality Date   APPENDECTOMY  1960's   BREAST BIOPSY Right    right   CATARACT EXTRACTION W/PHACO Left 11/01/2018   Procedure: CATARACT EXTRACTION PHACO AND INTRAOCULAR LENS PLACEMENT (IOC) LEFT;  Surgeon: Nevada Crane, MD;  Location: [facility];  Service: Ophthalmology;  Laterality: Left;   CATARACT EXTRACTION W/PHACO Right 11/29/2018   Procedure: CATARACT EXTRACTION PHACO AND INTRAOCULAR LENS PLACEMENT (IOC) RIGHT;  Surgeon: Nevada Crane, MD;  Location: [facility];  Service: Ophthalmology;  Laterality: Right;  Diabetes - diet conrolled sleep  apnea   CHOLECYSTECTOMY  2006   COLONOSCOPY WITH PROPOFOL N/A 10/30/2020   Procedure: COLONOSCOPY WITH PROPOFOL;  Surgeon: Regis Bill, MD;  Location: [facility];  Service: Endoscopy;  Laterality: N/A;   EXPLORATORY LAPAROTOMY  late 1990's   "blocked colon S/P hernia repair"   HERNIA REPAIR     "@ least 3 abdominal hernia repairs"   LUMBAR DISC SURGERY  1981   ruptured disc   SHOULDER ARTHROSCOPY WITH ROTATOR CUFF REPAIR AND SUBACROMIAL DECOMPRESSION Right 05/25/2019   Procedure: Right shoulder mini open rotator cuff repair, subacromial  decompression;  Surgeon: Jene Every, MD;  Location: [facility];  Service: Orthopedics;  Laterality: Right;  90 mins   VAGINAL HYSTERECTOMY  1981   VULVECTOMY PARTIAL N/A 07/08/2023   Procedure: VULVAR LESION, BIOPSY/REMOVAL;  Surgeon: Conard Novak, MD;  Location: [facility];  Service: Gynecology;  Laterality: N/A;    Current Medications: Current Meds  Medication Sig   acetaminophen (TYLENOL) 500 MG tablet Take 1,000 mg by mouth every 6 (six) hours as needed for moderate pain or headache.    albuterol (VENTOLIN HFA) 108 (90 Base) MCG/ACT inhaler INHALE 2 PUFFS BY MOUTH EVERY 6 HOURS AS NEEDED FOR WHEEZE OR SHORTNESS OF BREATH   apixaban (ELIQUIS) 5 MG TABS tablet Take 1 tablet (5 mg total) by mouth 2 (two) times daily.   buPROPion (WELLBUTRIN XL) 300 MG 24 hr tablet Take 1 tablet 300 mg by mouth every morning   Coenzyme Q10-Fish Oil-Vit E (CO-Q 10 OMEGA-3 FISH OIL PO) Take 1 capsule by mouth daily.   metoprolol succinate (TOPROL-XL) 50 MG 24 hr tablet Take 50 mg by mouth daily. Take with or immediately following a meal.   Multiple Vitamins-Minerals (PRESERVISION AREDS 2 PO) Take 1 capsule by mouth 2 (two) times daily.   omeprazole (PRILOSEC) 20 MG capsule Take 20 mg by mouth daily as needed (heartburn).      Allergies:   Codeine, Prednisone, Sulfa antibiotics, and Trimethoprim   Social History   Socioeconomic History   Marital status: Married    Spouse name: Tommy   Number of children: 2   Years of education: Not on file   Highest education level: Not on file  Occupational History   Not on file  Tobacco Use   Smoking status: Former    Current packs/day: 0.00    Average packs/day: 2.0 packs/day for 14.0 years (28.0 ttl pk-yrs)    Types: Cigarettes    Start date: 11/07/1961    Quit date: 11/08/1975    Years since quitting: 47.8    Passive exposure: Past   Smokeless tobacco: Never  Vaping Use   Vaping status: Never Used  Substance and Sexual Activity   Alcohol use: No     Alcohol/week: 0.0 standard drinks of alcohol   Drug use: No   Sexual activity: Yes    Partners: Male    Comment: Husband  Other Topics Concern   Not on file  Social History Narrative   Retired.   Lives with husband    Grown children- 2 daughters    2 grandsons    Pets- goats and dogs    Right handed    Caffeine- 1 soda a day, decaff coffee and tea other times    Enjoys reading    Social Determinants of Health   Financial Resource Strain: Low Risk  (09/25/2022)   Overall Financial Resource Strain (CARDIA)    Difficulty of Paying Living Expenses: Not hard at all  Food  Insecurity: No Food Insecurity (03/23/2023)   Hunger Vital Sign    Worried About Running Out of Food in the Last Year: Never true    Ran Out of Food in the Last Year: Never true  Transportation Needs: No Transportation Needs (03/23/2023)   PRAPARE - Administrator, Civil Service (Medical): No    Lack of Transportation (Non-Medical): No  Physical Activity: Not on file  Stress: No Stress Concern Present (09/25/2022)   [employer] of Occupational Health - Occupational Stress Questionnaire    Feeling of Stress : Not at all  Social Connections: Unknown (09/25/2022)   Social Connection and Isolation Panel [NHANES]    Frequency of Communication with Friends and Family: Not on file    Frequency of Social Gatherings with Friends and Family: Not on file    Attends Religious Services: Not on file    Active Member of Clubs or Organizations: Not on file    Attends Banker Meetings: Not on file    Marital Status: Married     Family History: The patient's family history includes Alcohol abuse in her father; Arthritis in her brother, father, maternal grandfather, maternal grandmother, mother, sister, and sister; Breast cancer (age of onset: 43) in her sister; Cancer in her brother, father, sister, and sister; Diabetes in her father; Heart disease in her brother; Heart disease (age of onset: 32) in her  father; Hyperlipidemia in her father; Hypertension in her father; Parkinson's disease in her sister; Stroke in her father.  ROS:   Please see the history of present illness.     All other systems reviewed and are negative.  EKGs/Labs/Other Studies Reviewed:    The following studies were reviewed today:  EKG Interpretation Date/Time:  Tuesday September 08 2023 08:59:18 EDT Ventricular Rate:  64 PR Interval:  206 QRS Duration:  116 QT Interval:  404 QTC Calculation: 416 R Axis:   -41  Text Interpretation: Normal sinus rhythm with sinus arrhythmia Left axis deviation Left ventricular hypertrophy with QRS widening ( R in aVL , Cornell product ) Cannot rule out Septal infarct Confirmed by Debbe Odea ([id number]) on 09/08/2023 9:02:26 AM    Recent Labs: 03/23/2023: ALT 19; B Natriuretic Peptide 30.9; Hemoglobin 11.5; Platelets 252 03/24/2023: TSH 1.663 05/08/2023: BUN 16; Creatinine, Ser 1.30; Potassium 4.2; Sodium 135  Recent Lipid Panel    Component Value Date/Time   CHOL 186 03/03/2022 1009   TRIG 100.0 03/03/2022 1009   HDL 58.00 03/03/2022 1009   CHOLHDL 3 03/03/2022 1009   VLDL 20.0 03/03/2022 1009   LDLCALC 108 (H) 03/03/2022 1009   LDLDIRECT 110.0 11/02/2017 1416     Risk Assessment/Calculations:               Physical Exam:    VS:  BP 138/86 (BP Location: Right Arm, Patient Position: Sitting, Cuff Size: Large)   Pulse 64   Ht 5\' 5"  (1.651 m)   Wt 255 lb (115.7 kg)   SpO2 98%   BMI 42.43 kg/m     Wt Readings from Last 3 Encounters:  09/08/23 255 lb (115.7 kg)  08/18/23 251 lb (113.9 kg)  07/08/23 253 lb (114.8 kg)     GEN:  Well nourished, well developed in no acute distress HEENT: Normal NECK: No JVD; No carotid bruits CARDIAC: RRR, no murmurs, rubs, gallops RESPIRATORY:  Clear to auscultation without rales, wheezing or rhonchi  ABDOMEN: Soft, non-tender, non-distended MUSCULOSKELETAL:  No edema; No deformity  SKIN: Warm and dry  NEUROLOGIC:  Alert  and oriented x 3 PSYCHIATRIC:  Normal affect   ASSESSMENT:    1. Atrial flutter, unspecified type (HCC)   2. Primary hypertension    PLAN:    In order of problems listed above:  Paroxysmal atrial flutter.  Continue Toprol-XL 50 mg daily, Eliquis 5 mg twice daily.  Has follow-up appointment with EP in 4 months. Hypertension, BP controlled.  Continue Toprol-XL 50 mg daily.     Follow-up in 12 months.  Medication Adjustments/Labs and Tests Ordered: Current medicines are reviewed at length with the patient today.  Concerns regarding medicines are outlined above.  Orders Placed This Encounter  Procedures   EKG 12-Lead   No orders of the defined types were placed in this encounter.   Patient Instructions  Medication Instructions:   Your physician recommends that you continue on your current medications as directed. Please refer to the Current Medication list given to you today.  *If you need a refill on your cardiac medications before your next appointment, please call your pharmacy*   Lab Work:  None Ordered  If you have labs (blood work) drawn today and your tests are completely normal, you will receive your results only by: MyChart Message (if you have MyChart) OR A paper copy in the mail If you have any lab test that is abnormal or we need to change your treatment, we will call you to review the results.   Testing/Procedures:  None Ordered   Follow-Up: At [facility], you and your health needs are our priority.  As part of our continuing mission to provide you with exceptional heart care, we have created designated Provider Care Teams.  These Care Teams include your primary Cardiologist (physician) and Advanced Practice Providers (APPs -  Physician Assistants and Nurse Practitioners) who all work together to provide you with the care you need, when you need it.  We recommend signing up for the patient portal called "MyChart".  Sign up information is  provided on this After Visit Summary.  MyChart is used to connect with patients for Virtual Visits (Telemedicine).  Patients are able to view lab/test results, encounter notes, upcoming appointments, etc.  Non-urgent messages can be sent to your provider as well.   To learn more about what you can do with MyChart, go to [email or web address].    Your next appointment:   12 month(s)  Provider:   You may see Debbe Odea, MD or one of the following Advanced Practice Providers on your designated Care Team:   Nicolasa Ducking, NP Eula Listen, PA-C Cadence Fransico Michael, PA-C Charlsie Quest, NP   Signed, Debbe Odea, MD  09/08/2023 9:36 AM    [facility]

## 2023-09-17 DIAGNOSIS — H353131 Nonexudative age-related macular degeneration, bilateral, early dry stage: Secondary | ICD-10-CM | POA: Diagnosis not present

## 2023-09-17 DIAGNOSIS — Z961 Presence of intraocular lens: Secondary | ICD-10-CM | POA: Diagnosis not present

## 2023-09-17 DIAGNOSIS — E119 Type 2 diabetes mellitus without complications: Secondary | ICD-10-CM | POA: Diagnosis not present

## 2023-09-17 DIAGNOSIS — H43813 Vitreous degeneration, bilateral: Secondary | ICD-10-CM | POA: Diagnosis not present

## 2023-09-17 LAB — HM DIABETES EYE EXAM

## 2023-09-29 ENCOUNTER — Ambulatory Visit (INDEPENDENT_AMBULATORY_CARE_PROVIDER_SITE_OTHER): Payer: Medicare Other | Admitting: *Deleted

## 2023-09-29 VITALS — Ht 64.0 in | Wt 253.0 lb

## 2023-09-29 DIAGNOSIS — Z1231 Encounter for screening mammogram for malignant neoplasm of breast: Secondary | ICD-10-CM

## 2023-09-29 DIAGNOSIS — Z0000 Encounter for general adult medical examination without abnormal findings: Secondary | ICD-10-CM | POA: Diagnosis not present

## 2023-09-29 NOTE — Patient Instructions (Signed)
Julie Rush , Thank you for taking time to come for your Medicare Wellness Visit. I appreciate your ongoing commitment to your health goals. Please review the following plan we discussed and let me know if I can assist you in the future.   Referrals/Orders/Follow-Ups/Clinician Recommendations: Mammogram, consider updating vaccines You have an order for:  []   2D Mammogram  [x]   3D Mammogram  []   Bone Density     Please call for appointment:  [facility] [address] [place], [place] [address] [phone]   Make sure to wear two-piece clothing.  No lotions, powders, or deodorants the day of the appointment. Make sure to bring picture ID and insurance card.  Bring list of medications you are currently taking including any supplements.   Schedule your [place] screening mammogram through MyChart!   Log into your MyChart account.  Go to 'Visit' (or 'Appointments' if on mobile App) --> Schedule an Appointment  Under 'Select a Reason for Visit' choose the Mammogram Screening option.  Complete the pre-visit questions and select the time and place that best fits your schedule.    This is a list of the screening recommended for you and due dates:  Health Maintenance  Topic Date Due   Zoster (Shingles) Vaccine (1 of 2) Never done   Complete foot exam   01/22/2020   Yearly kidney health urinalysis for diabetes  01/17/2022   Hemoglobin A1C  09/03/2022   COVID-19 Vaccine (2 - 2023-24 season) 08/23/2023   Mammogram  05/05/2024   Yearly kidney function blood test for diabetes  05/07/2024   Eye exam for diabetics  09/16/2024   DTaP/Tdap/Td vaccine (3 - Td or Tdap) 09/21/2024   Medicare Annual Wellness Visit  09/28/2024   Colon Cancer Screening  10/30/2025   Pneumonia Vaccine  Completed   DEXA scan (bone density measurement)  Completed   Hepatitis C Screening  Completed   HPV Vaccine  Aged Out   Flu Shot  Discontinued    Advanced directives: (Copy  Requested) Please bring a copy of your health care power of attorney and living will to the office to be added to your chart at your convenience.  Next Medicare Annual Wellness Visit scheduled for next year: Yes 10/03/24 @ 1:30

## 2023-09-29 NOTE — Progress Notes (Signed)
Subjective:   Julie Rush is a 72 y.o. female who presents for Medicare Annual (Subsequent) preventive examination.  Visit Complete: Virtual I connected with  Julie Rush on 09/29/23 by a audio enabled telemedicine application and verified that I am speaking with the correct person using two identifiers.  Patient Location: Home  Provider Location: Office/Clinic  I discussed the limitations of evaluation and management by telemedicine. The patient expressed understanding and agreed to proceed.  Vital Signs: Because this visit was a virtual/telehealth visit, some criteria may be missing or patient reported. Any vitals not documented were not able to be obtained and vitals that have been documented are patient reported.  Cardiac Risk Factors include: advanced age (>37men, >76 women);dyslipidemia;hypertension;obesity (BMI >30kg/m2);sedentary lifestyle     Objective:    Today's Vitals   09/29/23 1104  Weight: 253 lb (114.8 kg)  Height: 5\' 4"  (1.626 m)   Body mass index is 43.43 kg/m.     09/29/2023   11:15 AM 07/08/2023    6:30 AM 06/29/2023    2:52 PM 03/23/2023   10:12 PM 09/25/2022    9:19 AM 10/30/2020    9:24 AM 09/04/2020   10:02 AM  Advanced Directives  Does Patient Have a Medical Advance Directive? Yes Yes Yes Yes Yes Yes No  Type of Estate agent of [place];Living will Healthcare Power of [place];Living will Healthcare Power of [place];Living will Healthcare Power of [place];Living will Healthcare Power of [place];Living will Living will;Healthcare Power of Attorney   Does patient want to make changes to medical advance directive?  No - Patient declined  No - Patient declined No - Patient declined    Copy of Healthcare Power of Attorney in Chart? No - copy requested No - copy requested No - copy requested No - copy requested No - copy requested No - copy requested   Would patient like information on creating a medical advance directive?        No - Patient declined    Current Medications (verified) Outpatient Encounter Medications as of 09/29/2023  Medication Sig   acetaminophen (TYLENOL) 500 MG tablet Take 1,000 mg by mouth every 6 (six) hours as needed for moderate pain or headache.    albuterol (VENTOLIN HFA) 108 (90 Base) MCG/ACT inhaler INHALE 2 PUFFS BY MOUTH EVERY 6 HOURS AS NEEDED FOR WHEEZE OR SHORTNESS OF BREATH   apixaban (ELIQUIS) 5 MG TABS tablet Take 1 tablet (5 mg total) by mouth 2 (two) times daily.   buPROPion (WELLBUTRIN XL) 300 MG 24 hr tablet Take 1 tablet 300 mg by mouth every morning   Coenzyme Q10-Fish Oil-Vit E (CO-Q 10 OMEGA-3 FISH OIL PO) Take 1 capsule by mouth daily.   metoprolol succinate (TOPROL-XL) 50 MG 24 hr tablet Take 50 mg by mouth daily. Take with or immediately following a meal.   Multiple Vitamins-Minerals (PRESERVISION AREDS 2 PO) Take 1 capsule by mouth 2 (two) times daily.   omeprazole (PRILOSEC) 20 MG capsule Take 20 mg by mouth daily as needed (heartburn).    No facility-administered encounter medications on file as of 09/29/2023.    Allergies (verified) Codeine, Prednisone, Sulfa antibiotics, and Trimethoprim   History: Past Medical History:  Diagnosis Date   Anginal pain (HCC)    Aortic atherosclerosis (HCC)    Aortic stenosis    a.) TTE 03/24/2023: mild AS (MPG 8; AVA 2.23)   Arthritis    Atrial fibrillation and flutter (HCC)    a.) CHA2DS2VASc = 5 (age,  sex, HTN, vascular disease history, T2DM);  b.) rate/rhythm maintained on oral metoprolol succinate; chronically anticoagulated with apixaban   Bilateral carotid artery disease (HCC) 04/23/2023   a.) carotid doppler 04/23/2023: < 50% BICA   Chronic kidney disease, stage 3 (HCC)    Chronic lower back pain    Coronary artery calcification seen on CT scan    DDD (degenerative disc disease), thoracic    Depression 06/08/2012   "related to not getting definitive dx of what's wrong"   Diabetes mellitus type 2, diet-controlled  (HCC)    Diverticulosis    Exertional dyspnea    GERD (gastroesophageal reflux disease)    H/O hiatal hernia    History of stomach ulcers 1980's   Hyperlipidemia    Hypertension    Iron deficiency anemia    Long term current use of anticoagulant    a.) apixaban   Migraines    4-5x/yr   Morbid obesity with BMI of 40.0-44.9, adult (HCC)    Multiple renal cysts    Pneumonia    PONV (postoperative nausea and vomiting)    Seasonal asthma    Secondary hyperparathyroidism (HCC)    Sleep apnea 2013   "dx'd borderline"   Vertigo    Vulvar lesion (RIGHT)    Past Surgical History:  Procedure Laterality Date   APPENDECTOMY  1960's   BREAST BIOPSY Right    right   CATARACT EXTRACTION W/PHACO Left 11/01/2018   Procedure: CATARACT EXTRACTION PHACO AND INTRAOCULAR LENS PLACEMENT (IOC) LEFT;  Surgeon: Nevada Crane, MD;  Location: [facility];  Service: Ophthalmology;  Laterality: Left;   CATARACT EXTRACTION W/PHACO Right 11/29/2018   Procedure: CATARACT EXTRACTION PHACO AND INTRAOCULAR LENS PLACEMENT (IOC) RIGHT;  Surgeon: Nevada Crane, MD;  Location: [facility];  Service: Ophthalmology;  Laterality: Right;  Diabetes - diet conrolled sleep apnea   CHOLECYSTECTOMY  2006   COLONOSCOPY WITH PROPOFOL N/A 10/30/2020   Procedure: COLONOSCOPY WITH PROPOFOL;  Surgeon: Regis Bill, MD;  Location: [facility];  Service: Endoscopy;  Laterality: N/A;   EXPLORATORY LAPAROTOMY  late 1990's   "blocked colon S/P hernia repair"   HERNIA REPAIR     "@ least 3 abdominal hernia repairs"   LUMBAR DISC SURGERY  1981   ruptured disc   SHOULDER ARTHROSCOPY WITH ROTATOR CUFF REPAIR AND SUBACROMIAL DECOMPRESSION Right 05/25/2019   Procedure: Right shoulder mini open rotator cuff repair, subacromial decompression;  Surgeon: Jene Every, MD;  Location: [facility];  Service: Orthopedics;  Laterality: Right;  90 mins   VAGINAL HYSTERECTOMY  1981   VULVECTOMY PARTIAL N/A  07/08/2023   Procedure: VULVAR LESION, BIOPSY/REMOVAL;  Surgeon: Conard Novak, MD;  Location: [facility];  Service: Gynecology;  Laterality: N/A;   Family History  Problem Relation Age of Onset   Arthritis Mother    Cancer Father        prostate   Hyperlipidemia Father    Alcohol abuse Father    Arthritis Father    Heart disease Father 13       paased away at 16 from MI   Stroke Father    Hypertension Father    Diabetes Father    Cancer Sister        ovary   Arthritis Sister    Parkinson's disease Sister    Cancer Sister        breast   Arthritis Sister    Breast cancer Sister 59   Heart disease Brother    Cancer  Brother        thyroid   Arthritis Brother    Arthritis Maternal Grandmother    Arthritis Maternal Grandfather    Social History   Socioeconomic History   Marital status: Married    Spouse name: Tommy   Number of children: 2   Years of education: Not on file   Highest education level: Not on file  Occupational History   Not on file  Tobacco Use   Smoking status: Former    Current packs/day: 0.00    Average packs/day: 2.0 packs/day for 14.0 years (28.0 ttl pk-yrs)    Types: Cigarettes    Start date: 11/07/1961    Quit date: 11/08/1975    Years since quitting: 47.9    Passive exposure: Past   Smokeless tobacco: Never  Vaping Use   Vaping status: Never Used  Substance and Sexual Activity   Alcohol use: No    Alcohol/week: 0.0 standard drinks of alcohol   Drug use: No   Sexual activity: Yes    Partners: Male    Comment: Husband  Other Topics Concern   Not on file  Social History Narrative   Retired.   Lives with husband    Grown children- 2 daughters    2 grandsons    Pets- goats and dogs    Right handed    Caffeine- 1 soda a day, decaff coffee and tea other times    Enjoys reading    Social Determinants of Health   Financial Resource Strain: Low Risk  (09/29/2023)   Overall Financial Resource Strain (CARDIA)    Difficulty of Paying  Living Expenses: Not hard at all  Food Insecurity: No Food Insecurity (09/29/2023)   Hunger Vital Sign    Worried About Running Out of Food in the Last Year: Never true    Ran Out of Food in the Last Year: Never true  Transportation Needs: No Transportation Needs (09/29/2023)   PRAPARE - Administrator, Civil Service (Medical): No    Lack of Transportation (Non-Medical): No  Physical Activity: Inactive (09/29/2023)   Exercise Vital Sign    Days of Exercise per Week: 0 days    Minutes of Exercise per Session: 0 min  Stress: No Stress Concern Present (09/29/2023)   [employer] of Occupational Health - Occupational Stress Questionnaire    Feeling of Stress : Only a little  Social Connections: Moderately Integrated (09/29/2023)   Social Connection and Isolation Panel [NHANES]    Frequency of Communication with Friends and Family: More than three times a week    Frequency of Social Gatherings with Friends and Family: Once a week    Attends Religious Services: More than 4 times per year    Active Member of [employer] or Organizations: No    Attends Engineer, structural: Never    Marital Status: Married    Tobacco Counseling Counseling given: Not Answered   Clinical Intake:  Pre-visit preparation completed: Yes  Pain : No/denies pain     BMI - recorded: 43.43 Nutritional Status: BMI > 30  Obese Nutritional Risks: None Diabetes: No  How often do you need to have someone help you when you read instructions, pamphlets, or other written materials from your doctor or pharmacy?: 1 - Never  Interpreter Needed?: No  Information entered by :: R. Velinda Wrobel LPN   Activities of Daily Living    09/29/2023   11:05 AM 06/29/2023    3:03 PM  In your present state  of health, do you have any difficulty performing the following activities:  Hearing? 1   Comment some difficulty   Vision? 0   Comment glasses   Difficulty concentrating or making decisions? 0   Walking or  climbing stairs? 1   Dressing or bathing? 0   Doing errands, shopping? 0 0  Preparing Food and eating ? N   Using the Toilet? N   In the past six months, have you accidently leaked urine? Y   Comment wears pads   Do you have problems with loss of bowel control? N   Managing your Medications? N   Managing your Finances? N   Housekeeping or managing your Housekeeping? N     Patient Care Team: Allegra Grana, FNP as PCP - General (Family Medicine) Debbe Odea, MD as PCP - Cardiology (Cardiology) Lanier Prude, MD as PCP - Electrophysiology (Cardiology)  Indicate any recent Medical Services you may have received from other than [facility] providers in the past year (date may be approximate).     Assessment:   This is a routine wellness examination for [place].  Hearing/Vision screen Hearing Screening - Comments:: Some difficulty Vision Screening - Comments:: Glasses   Goals Addressed             This Visit's Progress    Patient Stated       Lose some weight and be more mobile       Depression Screen    09/29/2023   11:10 AM 04/03/2023   10:38 AM 03/23/2023   10:01 AM 09/25/2022    9:22 AM 04/30/2022    9:02 AM 03/03/2022   10:00 AM 09/04/2020    9:48 AM  PHQ 2/9 Scores  PHQ - 2 Score 0 0 0 0 0 0 0  PHQ- 9 Score 4 4 3   0 0     Fall Risk    09/29/2023   11:07 AM 04/03/2023   10:38 AM 03/23/2023   10:03 AM 03/23/2023   10:01 AM 09/25/2022    9:22 AM  Fall Risk   Falls in the past year? 0 0 0 0 0  Number falls in past yr: 0 0 0 0 0  Injury with Fall? 0 0 0 0 0  Risk for fall due to : No Fall Risks No Fall Risks No Fall Risks No Fall Risks   Follow up Falls prevention discussed;Falls evaluation completed Falls evaluation completed Falls evaluation completed Falls evaluation completed Falls evaluation completed    MEDICARE RISK AT HOME: Medicare Risk at Home Any stairs in or around the home?: No If so, are there any without handrails?: No Home free of loose  throw rugs in walkways, pet beds, electrical cords, etc?: Yes Adequate lighting in your home to reduce risk of falls?: Yes Life alert?: No Use of a cane, walker or w/c?: No Grab bars in the bathroom?: No Shower chair or bench in shower?: Yes Elevated toilet seat or a handicapped toilet?: Yes Cognitive Function:        09/29/2023   11:16 AM 09/25/2022    9:26 AM 08/09/2019    1:46 PM  6CIT Screen  What Year? 0 points 0 points 0 points  What month? 0 points 0 points 0 points  What time? 0 points 0 points 0 points  Count back from 20 0 points 0 points 0 points  Months in reverse 0 points 0 points 0 points  Repeat phrase 0 points 0 points 0 points  Total  Score 0 points 0 points 0 points    Immunizations Immunization History  Administered Date(s) Administered   Janssen (J&J) SARS-COV-2 Vaccination 09/05/2020   Pneumococcal Conjugate-13 08/31/2014, 11/02/2017   Pneumococcal Polysaccharide-23 01/21/2019   Pneumococcal-Unspecified 09/21/2014   Tdap 08/31/2014, 09/21/2014    TDAP status: Up to date  Flu Vaccine status: Declined, Education has been provided regarding the importance of this vaccine but patient still declined. Advised may receive this vaccine at local pharmacy or Health Dept. Aware to provide a copy of the vaccination record if obtained from local pharmacy or Health Dept. Verbalized acceptance and understanding.  Pneumococcal vaccine status: Up to date  Covid-19 vaccine status: Information provided on how to obtain vaccines.   Qualifies for Shingles Vaccine? Yes   Zostavax completed No   Shingrix Completed?: No.    Education has been provided regarding the importance of this vaccine. Patient has been advised to call insurance company to determine out of pocket expense if they have not yet received this vaccine. Advised may also receive vaccine at local pharmacy or Health Dept. Verbalized acceptance and understanding.  Screening Tests Health Maintenance  Topic Date  Due   Zoster Vaccines- Shingrix (1 of 2) Never done   FOOT EXAM  01/22/2020   Diabetic kidney evaluation - Urine ACR  01/17/2022   HEMOGLOBIN A1C  09/03/2022   COVID-19 Vaccine (2 - 2023-24 season) 08/23/2023   Medicare Annual Wellness (AWV)  09/26/2023   MAMMOGRAM  05/05/2024   Diabetic kidney evaluation - eGFR measurement  05/07/2024   OPHTHALMOLOGY EXAM  09/16/2024   DTaP/Tdap/Td (3 - Td or Tdap) 09/21/2024   Colonoscopy  10/30/2025   Pneumonia Vaccine 37+ Years old  Completed   DEXA SCAN  Completed   Hepatitis C Screening  Completed   HPV VACCINES  Aged Out   INFLUENZA VACCINE  Discontinued    Health Maintenance  Health Maintenance Due  Topic Date Due   Zoster Vaccines- Shingrix (1 of 2) Never done   FOOT EXAM  01/22/2020   Diabetic kidney evaluation - Urine ACR  01/17/2022   HEMOGLOBIN A1C  09/03/2022   COVID-19 Vaccine (2 - 2023-24 season) 08/23/2023   Medicare Annual Wellness (AWV)  09/26/2023    Colorectal cancer screening: Type of screening: Colonoscopy. Completed 10/2020. Repeat every 5 years  Mammogram status: Ordered 09/29/23. Pt provided with contact info and advised to call to schedule appt.   Bone Density status: Completed 08/2022. Results reflect: Bone density results: NORMAL. Repeat every 2 years.  Lung Cancer Screening: (Low Dose CT Chest recommended if Age 66-80 years, 20 pack-year currently smoking OR have quit w/in 15years.) does not qualify.     Additional Screening:  Hepatitis C Screening: does qualify; Completed 12/2016  Vision Screening: Recommended annual ophthalmology exams for early detection of glaucoma and other disorders of the eye. Is the patient up to date with their annual eye exam?  Yes  Who is the provider or what is the name of the office in which the patient attends annual eye exams? [facility] If pt is not established with a provider, would they like to be referred to a provider to establish care? No .   Dental Screening:  Recommended annual dental exams for proper oral hygiene    Community Resource Referral / Chronic Care Management: CRR required this visit?  No   CCM required this visit?  No     Plan:     I have personally reviewed and noted the following in the  patient's chart:   Medical and social history Use of alcohol, tobacco or illicit drugs  Current medications and supplements including opioid prescriptions. Patient is not currently taking opioid prescriptions. Functional ability and status Nutritional status Physical activity Advanced directives List of other physicians Hospitalizations, surgeries, and ER visits in previous 12 months Vitals Screenings to include cognitive, depression, and falls Referrals and appointments  In addition, I have reviewed and discussed with patient certain preventive protocols, quality metrics, and best practice recommendations. A written personalized care plan for preventive services as well as general preventive health recommendations were provided to patient.     Sydell Axon, LPN   16/12/958   After Visit Summary: (MyChart) Due to this being a telephonic visit, the after visit summary with patients personalized plan was offered to patient via MyChart   Nurse Notes: None

## 2023-09-30 DIAGNOSIS — N1831 Chronic kidney disease, stage 3a: Secondary | ICD-10-CM | POA: Diagnosis not present

## 2023-09-30 DIAGNOSIS — N2581 Secondary hyperparathyroidism of renal origin: Secondary | ICD-10-CM | POA: Diagnosis not present

## 2023-09-30 DIAGNOSIS — I10 Essential (primary) hypertension: Secondary | ICD-10-CM | POA: Diagnosis not present

## 2023-09-30 DIAGNOSIS — D631 Anemia in chronic kidney disease: Secondary | ICD-10-CM | POA: Diagnosis not present

## 2023-10-06 ENCOUNTER — Ambulatory Visit: Payer: Medicare Other

## 2023-10-06 ENCOUNTER — Ambulatory Visit
Admission: RE | Admit: 2023-10-06 | Discharge: 2023-10-06 | Disposition: A | Discharge status: Home / Self Care | Payer: Medicare Other | Source: Ambulatory Visit | Attending: Family

## 2023-10-06 DIAGNOSIS — Z1231 Encounter for screening mammogram for malignant neoplasm of breast: Secondary | ICD-10-CM

## 2023-10-20 ENCOUNTER — Ambulatory Visit: Payer: Medicare Other | Admitting: Urology

## 2023-10-21 ENCOUNTER — Ambulatory Visit
Admission: RE | Admit: 2023-10-21 | Discharge: 2023-10-21 | Disposition: A | Discharge status: Home / Self Care | Payer: Medicare Other | Source: Ambulatory Visit | Attending: Urology | Admitting: Urology

## 2023-10-21 DIAGNOSIS — Q6211 Congenital occlusion of ureteropelvic junction: Secondary | ICD-10-CM | POA: Diagnosis not present

## 2023-10-21 DIAGNOSIS — N1330 Unspecified hydronephrosis: Secondary | ICD-10-CM | POA: Diagnosis not present

## 2023-10-27 DIAGNOSIS — M170 Bilateral primary osteoarthritis of knee: Secondary | ICD-10-CM | POA: Diagnosis not present

## 2023-11-03 DIAGNOSIS — M170 Bilateral primary osteoarthritis of knee: Secondary | ICD-10-CM | POA: Diagnosis not present

## 2023-11-04 ENCOUNTER — Encounter: Payer: Self-pay | Admitting: Urology

## 2023-11-04 ENCOUNTER — Ambulatory Visit (INDEPENDENT_AMBULATORY_CARE_PROVIDER_SITE_OTHER): Payer: Medicare Other | Admitting: Urology

## 2023-11-04 VITALS — BP 144/76 | HR 71 | Ht 65.0 in | Wt 255.0 lb

## 2023-11-04 DIAGNOSIS — N1831 Chronic kidney disease, stage 3a: Secondary | ICD-10-CM

## 2023-11-04 DIAGNOSIS — N1330 Unspecified hydronephrosis: Secondary | ICD-10-CM | POA: Diagnosis not present

## 2023-11-04 DIAGNOSIS — N393 Stress incontinence (female) (male): Secondary | ICD-10-CM

## 2023-11-04 DIAGNOSIS — Q6211 Congenital occlusion of ureteropelvic junction: Secondary | ICD-10-CM

## 2023-11-04 NOTE — Progress Notes (Signed)
   11/04/2023 10:50 AM   Julie Rush 1951/06/24 [id number]  Reason for visit:  Follow up mild left hydronephrosis, CKD IIIa, stress incontinence   HPI: She is a 72 year old comorbid female with morbid obesity BMI 42, hypertension, CKD IIIa  referred for mild left hydronephrosis.  This was originally found on a renal ultrasound that was ordered by nephrology, and confirmed on a noncontrast CT June 2023 with some very subtle left hydronephrosis with questionable UPJ obstruction.  She has not had any flank pain, gross hematuria, or UTIs.  She opted for surveillance, and hydronephrosis has been stable on serial US imaging, most recently 10/21/23, formal read is pending.  Renal function is stable over the last 2 years with eGFR ranging between 41-53.   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  We discussed options including retrograde pyelogram or diagnostic ureteroscopy, versus repeat ultrasound in 1 year.  She opted for a repeat ultrasound in 1 year which is very reasonable, return precautions were discussed including worsening kidney function or recurrent infections, or left-sided flank pain. If no changes in imaging or renal function next year can likely RTC PRN.   RTC 1 year with renal ultrasound prior   Sondra Come, MD  [facility] [address], [address] [place], [place] [address] [phone]

## 2023-11-07 ENCOUNTER — Other Ambulatory Visit: Payer: Self-pay | Admitting: Cardiology

## 2023-11-09 ENCOUNTER — Other Ambulatory Visit: Payer: Self-pay | Admitting: Family

## 2023-11-09 DIAGNOSIS — F411 Generalized anxiety disorder: Secondary | ICD-10-CM

## 2023-11-09 NOTE — Telephone Encounter (Signed)
Prescription refill request for Eliquis received. Indication:afib Last office visit:9/24 Scr:1.30  5/24 Age: 72 Weight:115.7  kg  Prescription refilled

## 2023-11-10 ENCOUNTER — Other Ambulatory Visit: Payer: Self-pay | Admitting: Family

## 2023-11-10 DIAGNOSIS — F411 Generalized anxiety disorder: Secondary | ICD-10-CM

## 2023-11-10 MED ORDER — BUPROPION HCL ER (XL) 300 MG PO TB24
ORAL_TABLET | ORAL | 1 refills | Status: DC
Start: 2023-11-10 — End: 2024-03-10

## 2023-11-11 DIAGNOSIS — M170 Bilateral primary osteoarthritis of knee: Secondary | ICD-10-CM | POA: Diagnosis not present

## 2023-11-11 NOTE — Telephone Encounter (Signed)
Appt previously scheduled and u/s ordered.

## 2023-12-03 DIAGNOSIS — W19XXXA Unspecified fall, initial encounter: Secondary | ICD-10-CM | POA: Diagnosis not present

## 2023-12-03 DIAGNOSIS — R609 Edema, unspecified: Secondary | ICD-10-CM | POA: Diagnosis not present

## 2023-12-03 DIAGNOSIS — S6730XA Crushing injury of unspecified wrist, initial encounter: Secondary | ICD-10-CM | POA: Diagnosis not present

## 2023-12-04 DIAGNOSIS — M5451 Vertebrogenic low back pain: Secondary | ICD-10-CM | POA: Diagnosis not present

## 2023-12-04 DIAGNOSIS — M25531 Pain in right wrist: Secondary | ICD-10-CM | POA: Diagnosis not present

## 2023-12-21 ENCOUNTER — Ambulatory Visit: Payer: Medicare Other | Admitting: Cardiology

## 2023-12-26 NOTE — Progress Notes (Signed)
 Electrophysiology Clinic Note    Date:  12/28/2023  Patient ID:  Julie Rush, Julie Rush [birth year], MRN [id number] PCP:  Dineen Rollene MATSU, FNP  Cardiologist:  Redell Cave, MD Electrophysiologist: OLE ONEIDA HOLTS, MD   Discussed the use of AI scribe software for clinical note transcription with the patient, who gave verbal consent to proceed.    Patient Profile      Chief Complaint: afib/flutter follow-up  History of Present Illness: Julie Rush is a 73 y.o. female with PMH notable for parox AFib, parox Aflutter, HTN, T2DM, CKD stage 3a, ; seen today for OLE ONEIDA HOLTS, MD for routine electrophysiology followup.  She last saw Dr. Holts 06/2023 for EP consult for afib/flutter. She had not started on her eliquis  prior to the visit. It was discussed and she started it.  On follow-up today, she reports no recent episodes of AFib. She describes previous episodes as feeling jumpy, but has not experienced this sensation lately. She is currently on Eliquis  and metoprolol  for AFib management. The patient occasionally forgets to take her evening dose of Eliquis , but this is infrequent (approximately once a month). She reports no bleeding issues or blood in her urine or stool.  The patient's blood pressure is generally well-controlled, typically in the 120s. However, she admits to not checking her blood pressure very often. She also experiences some leg swelling, which she reports is normal for her. Swelling is decreased in the AM and tends slowly increase throughout the day.  The patient experiences chronic back and knee pain, which limits her physical activity. She reports difficulty with standing for long periods and limited walking due to pain. Despite this, she tries to stay active by getting up and down frequently throughout the day.   AAD History: None     ROS:  Please see the history of present illness. All other systems are reviewed and otherwise negative.     Physical Exam    VS:  BP 128/62 (BP Location: Left Arm, Patient Position: Sitting)   Pulse 77   Ht 5' 5 (1.651 m)   Wt 251 lb (113.9 kg)   SpO2 97%   BMI 41.77 kg/m  BMI: Body mass index is 41.77 kg/m.  Wt Readings from Last 3 Encounters:  12/28/23 251 lb (113.9 kg)  11/04/23 255 lb (115.7 kg)  09/29/23 253 lb (114.8 kg)     GEN- The patient is well appearing, alert and oriented x 3 today.   Lungs- Clear to ausculation bilaterally, normal work of breathing.  Heart- Regular rate and rhythm, no murmurs, rubs or gallops Extremities- 1+ peripheral edema, warm, dry. Tender.    Studies Reviewed   Previous EP, cardiology notes.    EKG is ordered. Personal review of EKG from today shows:    EKG Interpretation Date/Time:  Monday December 28 2023 10:13:06 EST Ventricular Rate:  80 PR Interval:  208 QRS Duration:  122 QT Interval:  360 QTC Calculation: 415 R Axis:   -42  Text Interpretation: Normal sinus rhythm Left axis deviation Left ventricular hypertrophy with QRS widening and repolarization abnormality ( R in aVL , Cornell product ) Confirmed by Tamya Denardo [phone]) on 12/28/2023 10:15:50 AM    Long term monitor, 05/06/2023 HR 63 - 154bpm, average 84 bpm.  <1% atrial flutter, average rate 97 bpm. Rare supraventricular and ventricular ectopy.  TTE, 03/24/2023  1. Left ventricular ejection fraction, by estimation, is 65 to 70%. The left ventricle has normal function. The left  ventricle has no regional wall motion abnormalities. There is mild left ventricular hypertrophy. Left ventricular diastolic parameters are indeterminate.   2. Right ventricular systolic function is normal. The right ventricular size is normal. There is normal pulmonary artery systolic pressure.   3. The mitral valve is normal in structure. Trivial mitral valve regurgitation. No evidence of mitral stenosis.   4. The aortic valve is normal in structure. Aortic valve regurgitation is not visualized. Mild  aortic valve stenosis. Aortic valve area, by VTI measures 2.23 cm. Aortic valve mean gradient measures 8.0 mmHg.     Assessment and Plan     #) parox Afib #) aflutter No recent symptomatic episodes Recent TTE with preserved LVEF Continue 12.5mg  toprol  daily  #) Hypercoag d/t parox afib CHA2DS2-VASc Score = at least 4 [CHF History: 0, HTN History: 1, Diabetes History: 1, Stroke History: 0, Vascular Disease History: 0, Age Score: 1, Gender Score: 1].  Therefore, the patient's annual risk of stroke is 4.8 %.    Stroke ppx - 5mg  eliquis  BID, appropriately dosed No bleeding concerns Encouraged use of reminders or phone alerts to aid adherance  #) HTN At goal today.  Recommend checking blood pressures 1-2 times per week at home and recording the values.  Recommend bringing these recordings to the primary care physician.  #) physical deconditioning Limited mobility due to back and knee pain. Discussed the importance of maintaining activity levels. -Encouraged to engage in low-impact exercises such as water aerobics and chair exercises. -Consider use of a pedal bike for seated exercise.         Current medicines are reviewed at length with the patient today.   The patient does not have concerns regarding her medicines.  The following changes were made today:  none  Labs/ tests ordered today include:  Orders Placed This Encounter  Procedures   EKG 12-Lead     Disposition: Follow up with Dr. Cindie or EP APP in 6 months   Signed, Chantal Needle, NP  12/28/23  11:49 AM  Electrophysiology [facility]

## 2023-12-28 ENCOUNTER — Ambulatory Visit: Payer: Medicare Other | Attending: Cardiology | Admitting: Cardiology

## 2023-12-28 ENCOUNTER — Encounter: Payer: Self-pay | Admitting: Cardiology

## 2023-12-28 VITALS — BP 128/62 | HR 77 | Ht 65.0 in | Wt 251.0 lb

## 2023-12-28 DIAGNOSIS — I10 Essential (primary) hypertension: Secondary | ICD-10-CM | POA: Diagnosis not present

## 2023-12-28 DIAGNOSIS — I480 Paroxysmal atrial fibrillation: Secondary | ICD-10-CM | POA: Diagnosis not present

## 2023-12-28 DIAGNOSIS — I4892 Unspecified atrial flutter: Secondary | ICD-10-CM

## 2023-12-28 NOTE — Patient Instructions (Addendum)
 Medication Instructions:   Your physician recommends that you continue on your current medications as directed. Please refer to the Current Medication list given to you today.  *If you need a refill on your cardiac medications before your next appointment, please call your pharmacy*   Lab Work:  None Ordered  If you have labs (blood work) drawn today and your tests are completely normal, you will receive your results only by: MyChart Message (if you have MyChart) OR A paper copy in the mail If you have any lab test that is abnormal or we need to change your treatment, we will call you to review the results.   Testing/Procedures:  None Ordered   Follow-Up: At [facility], you and your health needs are our priority.  As part of our continuing mission to provide you with exceptional heart care, we have created designated Provider Care Teams.  These Care Teams include your primary Cardiologist (physician) and Advanced Practice Providers (APPs -  Physician Assistants and Nurse Practitioners) who all work together to provide you with the care you need, when you need it.  We recommend signing up for the patient portal called MyChart.  Sign up information is provided on this After Visit Summary.  MyChart is used to connect with patients for Virtual Visits (Telemedicine).  Patients are able to view lab/test results, encounter notes, upcoming appointments, etc.  Non-urgent messages can be sent to your provider as well.   To learn more about what you can do with MyChart, go to [email or web address].    Your next appointment:   6 month(s)  Provider:   Ole Holts, MD or Suzann Riddle, NP

## 2024-01-07 DIAGNOSIS — K080 Exfoliation of teeth due to systemic causes: Secondary | ICD-10-CM | POA: Diagnosis not present

## 2024-01-14 DIAGNOSIS — K080 Exfoliation of teeth due to systemic causes: Secondary | ICD-10-CM | POA: Diagnosis not present

## 2024-02-16 ENCOUNTER — Ambulatory Visit (INDEPENDENT_AMBULATORY_CARE_PROVIDER_SITE_OTHER): Payer: Medicare Other

## 2024-02-16 ENCOUNTER — Ambulatory Visit (INDEPENDENT_AMBULATORY_CARE_PROVIDER_SITE_OTHER): Payer: Medicare Other | Admitting: Vascular Surgery

## 2024-02-16 VITALS — BP 122/72 | HR 73 | Resp 16 | Wt 253.4 lb

## 2024-02-16 DIAGNOSIS — E119 Type 2 diabetes mellitus without complications: Secondary | ICD-10-CM | POA: Diagnosis not present

## 2024-02-16 DIAGNOSIS — I6523 Occlusion and stenosis of bilateral carotid arteries: Secondary | ICD-10-CM

## 2024-02-16 DIAGNOSIS — I10 Essential (primary) hypertension: Secondary | ICD-10-CM

## 2024-02-16 NOTE — Progress Notes (Signed)
 MRN : [id number]  Julie Rush is a 73 y.o. (08-05-1951) female who presents with chief complaint of  Chief Complaint  Patient presents with   Follow-up    6 month carotid follow up  .  History of Present Illness: Patient returns today in follow up of her carotid disease.  She is doing well with no complaints today.  No focal neurologic symptoms. Specifically, the patient denies amaurosis fugax, speech or swallowing difficulties, or arm or leg weakness or numbness.  Carotid duplex today shows stable 1 to 39% ICA stenosis bilaterally without significant progression from previous studies.  Current Outpatient Medications  Medication Sig Dispense Refill   acetaminophen (TYLENOL) 500 MG tablet Take 1,000 mg by mouth every 6 (six) hours as needed for moderate pain or headache.      albuterol (VENTOLIN HFA) 108 (90 Base) MCG/ACT inhaler INHALE 2 PUFFS BY MOUTH EVERY 6 HOURS AS NEEDED FOR WHEEZE OR SHORTNESS OF BREATH 6.7 each 1   buPROPion (WELLBUTRIN XL) 300 MG 24 hr tablet Take 1 tablet 300 mg by mouth every morning 90 tablet 1   Coenzyme Q10-Fish Oil-Vit E (CO-Q 10 OMEGA-3 FISH OIL PO) Take 1 capsule by mouth daily.     ELIQUIS 5 MG TABS tablet TAKE 1 TABLET BY MOUTH TWICE A DAY 60 tablet 3   metoprolol succinate (TOPROL-XL) 25 MG 24 hr tablet Take 12.5 mg by mouth daily.     Multiple Vitamins-Minerals (PRESERVISION AREDS 2 PO) Take 1 capsule by mouth 2 (two) times daily.     omeprazole (PRILOSEC) 20 MG capsule Take 20 mg by mouth daily as needed (heartburn).      No current facility-administered medications for this visit.    Past Medical History:  Diagnosis Date   Anginal pain (HCC)    Aortic atherosclerosis (HCC)    Aortic stenosis    a.) TTE 03/24/2023: mild AS (MPG 8; AVA 2.23)   Arthritis    Atrial fibrillation and flutter (HCC)    a.) CHA2DS2VASc = 5 (age, sex, HTN, vascular disease history, T2DM);  b.) rate/rhythm maintained on oral metoprolol succinate; chronically  anticoagulated with apixaban   Bilateral carotid artery disease (HCC) 04/23/2023   a.) carotid doppler 04/23/2023: < 50% BICA   Chronic kidney disease, stage 3 (HCC)    Chronic lower back pain    Coronary artery calcification seen on CT scan    DDD (degenerative disc disease), thoracic    Depression 06/08/2012   "related to not getting definitive dx of what's wrong"   Diabetes mellitus type 2, diet-controlled (HCC)    Diverticulosis    Exertional dyspnea    GERD (gastroesophageal reflux disease)    H/O hiatal hernia    History of stomach ulcers 1980's   Hyperlipidemia    Hypertension    Iron deficiency anemia    Long term current use of anticoagulant    a.) apixaban   Migraines    4-5x/yr   Morbid obesity with BMI of 40.0-44.9, adult (HCC)    Multiple renal cysts    Pneumonia    PONV (postoperative nausea and vomiting)    Seasonal asthma    Secondary hyperparathyroidism (HCC)    Sleep apnea 2013   "dx'd borderline"   Vertigo    Vulvar lesion (RIGHT)     Past Surgical History:  Procedure Laterality Date   APPENDECTOMY  1960's   BREAST BIOPSY Right    right   CATARACT EXTRACTION W/PHACO Left 11/01/2018   Procedure:  CATARACT EXTRACTION PHACO AND INTRAOCULAR LENS PLACEMENT (IOC) LEFT;  Surgeon: Nevada Crane, MD;  Location: [facility];  Service: Ophthalmology;  Laterality: Left;   CATARACT EXTRACTION W/PHACO Right 11/29/2018   Procedure: CATARACT EXTRACTION PHACO AND INTRAOCULAR LENS PLACEMENT (IOC) RIGHT;  Surgeon: Nevada Crane, MD;  Location: [facility];  Service: Ophthalmology;  Laterality: Right;  Diabetes - diet conrolled sleep apnea   CHOLECYSTECTOMY  2006   COLONOSCOPY WITH PROPOFOL N/A 10/30/2020   Procedure: COLONOSCOPY WITH PROPOFOL;  Surgeon: Regis Bill, MD;  Location: [facility];  Service: Endoscopy;  Laterality: N/A;   EXPLORATORY LAPAROTOMY  late 1990's   "blocked colon S/P hernia repair"   HERNIA REPAIR     "@  least 3 abdominal hernia repairs"   LUMBAR DISC SURGERY  1981   ruptured disc   SHOULDER ARTHROSCOPY WITH ROTATOR CUFF REPAIR AND SUBACROMIAL DECOMPRESSION Right 05/25/2019   Procedure: Right shoulder mini open rotator cuff repair, subacromial decompression;  Surgeon: Jene Every, MD;  Location: [facility];  Service: Orthopedics;  Laterality: Right;  90 mins   VAGINAL HYSTERECTOMY  1981   VULVECTOMY PARTIAL N/A 07/08/2023   Procedure: VULVAR LESION, BIOPSY/REMOVAL;  Surgeon: Conard Novak, MD;  Location: [facility];  Service: Gynecology;  Laterality: N/A;     Social History   Tobacco Use   Smoking status: Former    Current packs/day: 0.00    Average packs/day: 2.0 packs/day for 14.0 years (28.0 ttl pk-yrs)    Types: Cigarettes    Start date: 11/07/1961    Quit date: 11/08/1975    Years since quitting: 48.3    Passive exposure: Past   Smokeless tobacco: Never  Vaping Use   Vaping status: Never Used  Substance Use Topics   Alcohol use: No    Alcohol/week: 0.0 standard drinks of alcohol   Drug use: No      Family History  Problem Relation Age of Onset   Arthritis Mother    Cancer Father        prostate   Hyperlipidemia Father    Alcohol abuse Father    Arthritis Father    Heart disease Father 60       paased away at 60 from MI   Stroke Father    Hypertension Father    Diabetes Father    Cancer Sister        ovary   Arthritis Sister    Parkinson's disease Sister    Cancer Sister        breast   Arthritis Sister    Breast cancer Sister 57   Heart disease Brother    Cancer Brother        thyroid   Arthritis Brother    Arthritis Maternal Grandmother    Arthritis Maternal Grandfather      Allergies  Allergen Reactions   Codeine Nausea And Vomiting and Nausea Only    Other Reaction(s): Other (See Comments), Vomiting  Other reaction(s): Unknown   Prednisone Palpitations    Other Reaction(s): Other (See Comments)   Sulfa Antibiotics Itching and Rash     Other Reaction(s): Unknown   Sulfur Dioxide Hives   Trimethoprim Hives     REVIEW OF SYSTEMS (Negative unless checked)   Constitutional: [] Weight loss  [] Fever  [] Chills Cardiac: [] Chest pain   [] Chest pressure   [] Palpitations   [] Shortness of breath when laying flat   [] Shortness of breath at rest   [x] Shortness of breath with exertion. Vascular:  [] Pain  in legs with walking   [] Pain in legs at rest   [] Pain in legs when laying flat   [] Claudication   [] Pain in feet when walking  [] Pain in feet at rest  [] Pain in feet when laying flat   [] History of DVT   [] Phlebitis   [] Swelling in legs   [] Varicose veins   [] Non-healing ulcers Pulmonary:   [] Uses home oxygen   [] Productive cough   [] Hemoptysis   [] Wheeze  [] COPD   [] Asthma Neurologic:  [x] Dizziness  [] Blackouts   [] Seizures   [] History of stroke   [] History of TIA  [] Aphasia   [] Temporary blindness   [] Dysphagia   [] Weakness or numbness in arms   [] Weakness or numbness in legs Musculoskeletal:  [x] Arthritis   [] Joint swelling   [x] Joint pain   [] Low back pain Hematologic:  [] Easy bruising  [] Easy bleeding   [] Hypercoagulable state   [x] Anemic  [] Hepatitis Gastrointestinal:  [] Blood in stool   [] Vomiting blood  [x] Gastroesophageal reflux/heartburn   [] Difficulty swallowing. Genitourinary:  [] Chronic kidney disease   [] Difficult urination  [] Frequent urination  [] Burning with urination   [] Blood in urine Skin:  [] Rashes   [] Ulcers   [] Wounds Psychological:  [] History of anxiety   []  History of major depression.   Physical Examination  BP 122/72   Pulse 73   Resp 16   Wt 253 lb 6.4 oz (114.9 kg)   BMI 42.17 kg/m  Gen:  WD/WN, NAD Head: [place]/AT, No temporalis wasting. Ear/Nose/Throat: Hearing grossly intact, nares w/o erythema or drainage Eyes: Conjunctiva clear. Sclera non-icteric Neck: Supple.  Trachea midline. No bruits.  Pulmonary:  Good air movement, no use of accessory muscles.  Cardiac: RRR, no JVD Vascular:  Vessel Right  Left  Radial Palpable Palpable               Musculoskeletal: M/S 5/5 throughout.  No deformity or atrophy.  Neurologic: Sensation grossly intact in extremities.  Symmetrical.  Speech is fluent.  Psychiatric: Judgment intact, Mood & affect appropriate for pt's clinical situation. Dermatologic: No rashes or ulcers noted.  No cellulitis or open wounds.      Labs No results found for this or any previous visit (from the past 2160 hours).  Radiology No results found.  Assessment/Plan  Bilateral carotid artery disease (HCC) Carotid duplex today shows stable 1 to 39% ICA stenosis bilaterally without significant progression from previous studies.  Is already on Eliquis for other reasons and will continue this.  No role for intervention at this level.  We can go to an annual follow-up at this point.  Essential hypertension, benign blood pressure control important in reducing the progression of atherosclerotic disease. On appropriate oral medications.     Diabetes mellitus type 2, diet-controlled (HCC) blood glucose control important in reducing the progression of atherosclerotic disease. Also, involved in wound healing. On appropriate medications.  Festus Barren, MD  02/16/2024 11:26 AM    This note was created with Dragon medical transcription system.  Any errors from dictation are purely unintentional

## 2024-02-16 NOTE — Assessment & Plan Note (Signed)
 Carotid duplex today shows stable 1 to 39% ICA stenosis bilaterally without significant progression from previous studies.  Is already on Eliquis for other reasons and will continue this.  No role for intervention at this level.  We can go to an annual follow-up at this point.

## 2024-02-17 DIAGNOSIS — K080 Exfoliation of teeth due to systemic causes: Secondary | ICD-10-CM | POA: Diagnosis not present

## 2024-03-09 ENCOUNTER — Encounter: Payer: Self-pay | Admitting: Family

## 2024-03-10 ENCOUNTER — Other Ambulatory Visit: Payer: Self-pay

## 2024-03-10 DIAGNOSIS — F411 Generalized anxiety disorder: Secondary | ICD-10-CM

## 2024-03-10 MED ORDER — METOPROLOL SUCCINATE ER 25 MG PO TB24: 12.5000 mg | ORAL_TABLET | Freq: Every day | ORAL | 1 refills | Status: DC

## 2024-03-10 MED ORDER — BUPROPION HCL ER (XL) 300 MG PO TB24
ORAL_TABLET | ORAL | 1 refills | Status: DC
Start: 2024-03-10 — End: 2024-04-27

## 2024-03-10 NOTE — Telephone Encounter (Signed)
 Spoke to pt and informed her that her rx s have been sent in to  the pharmacy

## 2024-03-15 DIAGNOSIS — N2581 Secondary hyperparathyroidism of renal origin: Secondary | ICD-10-CM | POA: Diagnosis not present

## 2024-03-15 DIAGNOSIS — N1831 Chronic kidney disease, stage 3a: Secondary | ICD-10-CM | POA: Diagnosis not present

## 2024-03-15 DIAGNOSIS — I10 Essential (primary) hypertension: Secondary | ICD-10-CM | POA: Diagnosis not present

## 2024-03-15 DIAGNOSIS — N1330 Unspecified hydronephrosis: Secondary | ICD-10-CM | POA: Diagnosis not present

## 2024-03-22 ENCOUNTER — Encounter: Payer: Self-pay | Admitting: Cardiology

## 2024-03-22 DIAGNOSIS — N1330 Unspecified hydronephrosis: Secondary | ICD-10-CM | POA: Diagnosis not present

## 2024-03-22 DIAGNOSIS — I10 Essential (primary) hypertension: Secondary | ICD-10-CM | POA: Diagnosis not present

## 2024-03-22 DIAGNOSIS — I4892 Unspecified atrial flutter: Secondary | ICD-10-CM

## 2024-03-22 DIAGNOSIS — D631 Anemia in chronic kidney disease: Secondary | ICD-10-CM | POA: Diagnosis not present

## 2024-03-22 DIAGNOSIS — N1832 Chronic kidney disease, stage 3b: Secondary | ICD-10-CM | POA: Diagnosis not present

## 2024-03-23 MED ORDER — APIXABAN 5 MG PO TABS
5.0000 mg | ORAL_TABLET | Freq: Two times a day (BID) | ORAL | 5 refills | Status: DC
Start: 2024-03-23 — End: 2024-08-24

## 2024-03-23 NOTE — Telephone Encounter (Signed)
 Prescription refill request for Eliquis received. Indication: Aflutter Last office visit: 12/28/23 (Riddle)  Scr: 1.28 (03/15/24)  Age: 73 Weight: 114.9kg  Appropriate dose. Refill sent.

## 2024-04-23 ENCOUNTER — Other Ambulatory Visit: Payer: Self-pay | Admitting: Family

## 2024-04-23 DIAGNOSIS — F411 Generalized anxiety disorder: Secondary | ICD-10-CM

## 2024-04-27 NOTE — Telephone Encounter (Signed)
 Pt scheduled to be seen 06/09/24

## 2024-05-10 ENCOUNTER — Encounter (INDEPENDENT_AMBULATORY_CARE_PROVIDER_SITE_OTHER): Payer: Self-pay

## 2024-06-09 ENCOUNTER — Encounter: Admitting: Family

## 2024-06-09 ENCOUNTER — Encounter: Payer: Self-pay | Admitting: Family

## 2024-06-09 ENCOUNTER — Ambulatory Visit: Admitting: Family

## 2024-06-09 VITALS — BP 128/78 | HR 77 | Temp 97.7°F | Ht 65.0 in | Wt 255.2 lb

## 2024-06-09 DIAGNOSIS — Z13220 Encounter for screening for lipoid disorders: Secondary | ICD-10-CM | POA: Diagnosis not present

## 2024-06-09 DIAGNOSIS — Z780 Asymptomatic menopausal state: Secondary | ICD-10-CM | POA: Diagnosis not present

## 2024-06-09 DIAGNOSIS — Z87898 Personal history of other specified conditions: Secondary | ICD-10-CM | POA: Diagnosis not present

## 2024-06-09 DIAGNOSIS — Z136 Encounter for screening for cardiovascular disorders: Secondary | ICD-10-CM

## 2024-06-09 DIAGNOSIS — Z1231 Encounter for screening mammogram for malignant neoplasm of breast: Secondary | ICD-10-CM

## 2024-06-09 DIAGNOSIS — Z0000 Encounter for general adult medical examination without abnormal findings: Secondary | ICD-10-CM

## 2024-06-09 LAB — COMPREHENSIVE METABOLIC PANEL WITH GFR
ALT: 19 U/L (ref 0–35)
AST: 23 U/L (ref 0–37)
Albumin: 3.9 g/dL (ref 3.5–5.2)
Alkaline Phosphatase: 53 U/L (ref 39–117)
BUN: 21 mg/dL (ref 6–23)
CO2: 28 meq/L (ref 19–32)
Calcium: 10.1 mg/dL (ref 8.4–10.5)
Chloride: 102 meq/L (ref 96–112)
Creatinine, Ser: 1.23 mg/dL — ABNORMAL HIGH (ref 0.40–1.20)
GFR: 43.19 mL/min — ABNORMAL LOW (ref >60.00)
Glucose, Bld: 103 mg/dL — ABNORMAL HIGH (ref 70–99)
Potassium: 4.4 meq/L (ref 3.5–5.1)
Sodium: 136 meq/L (ref 135–145)
Total Bilirubin: 0.5 mg/dL (ref 0.2–1.2)
Total Protein: 6.7 g/dL (ref 6.0–8.3)

## 2024-06-09 LAB — HEMOGLOBIN A1C: Hgb A1c MFr Bld: 5.8 % (ref 4.6–6.5)

## 2024-06-09 LAB — VITAMIN D 25 HYDROXY (VIT D DEFICIENCY, FRACTURES): VITD: 30.41 ng/mL (ref 30.00–100.00)

## 2024-06-09 LAB — LIPID PANEL
Cholesterol: 177 mg/dL (ref 0–200)
HDL: 53.1 mg/dL (ref >39.00)
LDL Cholesterol: 101 mg/dL — ABNORMAL HIGH (ref 0–99)
NonHDL: 124.37
Total CHOL/HDL Ratio: 3
Triglycerides: 117 mg/dL (ref 0.0–149.0)
VLDL: 23.4 mg/dL (ref 0.0–40.0)

## 2024-06-09 LAB — TSH: TSH: 2.38 u[IU]/mL (ref 0.35–5.50)

## 2024-06-09 NOTE — Progress Notes (Signed)
 Assessment & Plan:  Routine physical examination Assessment & Plan: Deferred clinical breast exam due to patient preference.  Mammogram is up-to-date.  Deferred pelvic exam patient is no longer screening cervical cancer and no pelvic complaints at this time.  Will schedule DEXA, mammogram.  Orders: -     VITAMIN D  25 Hydroxy (Vit-D Deficiency, Fractures) -     Hemoglobin A1c -     Comprehensive metabolic panel with GFR -     Lipid panel -     TSH -     3D Screening Mammogram, Left and Right; Future -     DG Bone Density; Future  History of prediabetes -     Hemoglobin A1c  Encounter for lipid screening for cardiovascular disease -     Lipid panel  Asymptomatic postmenopausal state -     VITAMIN D  25 Hydroxy (Vit-D Deficiency, Fractures) -     DG Bone Density; Future  Encounter for screening mammogram for malignant neoplasm of breast -     DG Bone Density; Future     Return precautions given.   Risks, benefits, and alternatives of the medications and treatment plan prescribed today were discussed, and patient expressed understanding.   Education regarding symptom management and diagnosis given to patient on AVS either electronically or printed.  No follow-ups on file.  Bascom Bossier, FNP  Subjective:    Patient ID: Julie Rush, female    DOB: 04-07-1951, 73 y.o.   MRN: [id number]  CC: Julie Rush is a 73 y.o. female who presents today for physical exam.    HPI: Feels well today No new complaints    Follows with Dr. Rhesa Celeste stage IIIb chronic kidney disease, hydronephrosis, hyperparathyroidism, anemia of chronic disease; last seen 03/22/2024. Urine protein/ crt ratio 77  03/15/24   Colorectal Cancer Screening: UTD , Dr Emerick Hanlon 10/30/20, no polyps; hemorrhoids; repeat in 5 years Breast Cancer Screening: Mammogram UTD Cervical Cancer Screening: No longer screening for cervical cancer.  History of hysterectomy 1981 Bone Health screening/DEXA for 65+:  due  Lung Cancer Screening: Does not meet criteria  No family history of AAA.        Tetanus - UTD        Pneumococcal - Candidate for.  PCV20 ; declines   Alcohol use:  none; quit more than 15 years ago Smoking/tobacco use: former smoker.   Exercise limited by chronic LBP, knee pain. She uses a cane.  Health Maintenance  Topic Date Due   Complete foot exam   01/22/2020   Hemoglobin A1C  09/03/2022   COVID-19 Vaccine (2 - 2024-25 season) 08/23/2023   Yearly kidney function blood test for diabetes  05/07/2024   Zoster (Shingles) Vaccine (1 of 2) 09/09/2024*   Yearly kidney health urinalysis for diabetes  03/25/2025*   Eye exam for diabetics  09/16/2024   DTaP/Tdap/Td vaccine (3 - Td or Tdap) 09/21/2024   Medicare Annual Wellness Visit  09/28/2024   Mammogram  10/05/2025   Colon Cancer Screening  10/30/2025   Pneumococcal Vaccine for age over 31  Completed   DEXA scan (bone density measurement)  Completed   Hepatitis C Screening  Completed   HPV Vaccine  Aged Out   Meningitis B Vaccine  Aged Out   Flu Shot  Discontinued  *Topic was postponed. The date shown is not the original due date.    ALLERGIES: Codeine, Prednisone, Sulfa antibiotics, Sulfur dioxide, and Trimethoprim  Current Outpatient Medications on File Prior to Visit  Medication Sig Dispense Refill   acetaminophen  (TYLENOL ) 500 MG tablet Take 1,000 mg by mouth every 6 (six) hours as needed for moderate pain or headache.      albuterol  (VENTOLIN  HFA) 108 (90 Base) MCG/ACT inhaler INHALE 2 PUFFS BY MOUTH EVERY 6 HOURS AS NEEDED FOR WHEEZE OR SHORTNESS OF BREATH 6.7 each 1   apixaban  (ELIQUIS ) 5 MG TABS tablet Take 1 tablet (5 mg total) by mouth 2 (two) times daily. 60 tablet 5   buPROPion  (WELLBUTRIN  XL) 300 MG 24 hr tablet TAKE 1 TABLET BY MOUTH EVERY MORNING 90 tablet 0   Coenzyme Q10-Fish Oil-Vit E (CO-Q 10 OMEGA-3 FISH OIL PO) Take 1 capsule by mouth daily.     metoprolol  succinate (TOPROL -XL) 25 MG 24 hr tablet  Take 0.5 tablets (12.5 mg total) by mouth daily. 90 tablet 1   Multiple Vitamins-Minerals (PRESERVISION AREDS 2 PO) Take 1 capsule by mouth 2 (two) times daily.     omeprazole  (PRILOSEC ) 20 MG capsule Take 20 mg by mouth daily as needed (heartburn).      No current facility-administered medications on file prior to visit.    Review of Systems  Constitutional:  Negative for chills, fever and unexpected weight change.  HENT:  Negative for congestion.   Respiratory:  Negative for cough.   Cardiovascular:  Negative for chest pain, palpitations and leg swelling.  Gastrointestinal:  Negative for nausea and vomiting.  Musculoskeletal:  Negative for arthralgias and myalgias.  Skin:  Negative for rash.  Neurological:  Negative for headaches.  Hematological:  Negative for adenopathy.  Psychiatric/Behavioral:  Negative for confusion.       Objective:    BP 128/78   Pulse 77   Temp 97.7 F (36.5 C) (Oral)   Ht 5' 5 (1.651 m)   Wt 255 lb 3.2 oz (115.8 kg)   SpO2 97%   BMI 42.47 kg/m   BP Readings from Last 3 Encounters:  06/09/24 128/78  02/16/24 122/72  12/28/23 128/62   Wt Readings from Last 3 Encounters:  06/09/24 255 lb 3.2 oz (115.8 kg)  02/16/24 253 lb 6.4 oz (114.9 kg)  12/28/23 251 lb (113.9 kg)    Physical Exam Vitals reviewed.  Constitutional:      Appearance: She is well-developed.   Eyes:     Conjunctiva/sclera: Conjunctivae normal.   Neck:     Thyroid : No thyroid  mass or thyromegaly.   Cardiovascular:     Rate and Rhythm: Normal rate and regular rhythm.     Pulses: Normal pulses.     Heart sounds: Normal heart sounds.  Pulmonary:     Effort: Pulmonary effort is normal.     Breath sounds: Normal breath sounds. No wheezing, rhonchi or rales.  Lymphadenopathy:     Head:     Right side of head: No submental, submandibular, tonsillar, preauricular, posterior auricular or occipital adenopathy.     Left side of head: No submental, submandibular, tonsillar,  preauricular, posterior auricular or occipital adenopathy.     Cervical: No cervical adenopathy.   Skin:    General: Skin is warm and dry.   Neurological:     Mental Status: She is alert.   Psychiatric:        Speech: Speech normal.        Behavior: Behavior normal.        Thought Content: Thought content normal.

## 2024-06-09 NOTE — Patient Instructions (Addendum)
 Please call  and schedule your 3D mammogram and /or bone density scan as we discussed due late 09/2024   Julie Rush  ( new location in 2023)  [address], [place], [place] [address]  [place], [place]  [phone]   Health Maintenance for Postmenopausal Women Menopause is a normal process in which your ability to get pregnant comes to an end. This process happens slowly over many months or years, usually between the ages of 35 and 57. Menopause is complete when you have missed your menstrual period for 12 months. It is important to talk with your health care provider about some of the most common conditions that affect women after menopause (postmenopausal women). These include heart disease, cancer, and bone loss (osteoporosis). Adopting a healthy lifestyle and getting preventive care can help to promote your health and wellness. The actions you take can also lower your chances of developing some of these common conditions. What are the signs and symptoms of menopause? During menopause, you may have the following symptoms: Hot flashes. These can be moderate or severe. Night sweats. Decrease in sex drive. Mood swings. Headaches. Tiredness (fatigue). Irritability. Memory problems. Problems falling asleep or staying asleep. Talk with your health care provider about treatment options for your symptoms. Do I need hormone replacement therapy? Hormone replacement therapy is effective in treating symptoms that are caused by menopause, such as hot flashes and night sweats. Hormone replacement carries certain risks, especially as you become older. If you are thinking about using estrogen or estrogen with progestin, discuss the benefits and risks with your health care provider. How can I reduce my risk for heart disease and stroke? The risk of heart disease, heart attack, and stroke increases as you age. One of the causes may be a change in the body's hormones during  menopause. This can affect how your body uses dietary fats, triglycerides, and cholesterol. Heart attack and stroke are medical emergencies. There are many things that you can do to help prevent heart disease and stroke. Watch your blood pressure High blood pressure causes heart disease and increases the risk of stroke. This is more likely to develop in people who have high blood pressure readings or are overweight. Have your blood pressure checked: Every 3-5 years if you are 84-27 years of age. Every year if you are 22 years old or older. Eat a healthy diet  Eat a diet that includes plenty of vegetables, fruits, low-fat dairy products, and lean protein. Do not eat a lot of foods that are high in solid fats, added sugars, or sodium. Get regular exercise Get regular exercise. This is one of the most important things you can do for your health. Most adults should: Try to exercise for at least 150 minutes each week. The exercise should increase your heart rate and make you sweat (moderate-intensity exercise). Try to do strengthening exercises at least twice each week. Do these in addition to the moderate-intensity exercise. Spend less time sitting. Even light physical activity can be beneficial. Other tips Work with your health care provider to achieve or maintain a healthy weight. Do not use any products that contain nicotine or tobacco. These products include cigarettes, chewing tobacco, and vaping devices, such as e-cigarettes. If you need help quitting, ask your health care provider. Know your numbers. Ask your health care provider to check your cholesterol and your blood sugar (glucose). Continue to have your blood tested as directed by your health care provider. Do I need screening  for cancer? Depending on your health history and family history, you may need to have cancer screenings at different stages of your life. This may include screening for: Breast cancer. Cervical cancer. Lung  cancer. Colorectal cancer. What is my risk for osteoporosis? After menopause, you may be at increased risk for osteoporosis. Osteoporosis is a condition in which bone destruction happens more quickly than new bone creation. To help prevent osteoporosis or the bone fractures that can happen because of osteoporosis, you may take the following actions: If you are 54-82 years old, get at least 1,000 mg of calcium  and at least 600 international units (IU) of vitamin D  per day. If you are older than age 26 but younger than age 66, get at least 1,200 mg of calcium  and at least 600 international units (IU) of vitamin D  per day. If you are older than age 48, get at least 1,200 mg of calcium  and at least 800 international units (IU) of vitamin D  per day. Smoking and drinking excessive alcohol increase the risk of osteoporosis. Eat foods that are rich in calcium  and vitamin D , and do weight-bearing exercises several times each week as directed by your health care provider. How does menopause affect my mental health? Depression may occur at any age, but it is more common as you become older. Common symptoms of depression include: Feeling depressed. Changes in sleep patterns. Changes in appetite or eating patterns. Feeling an overall lack of motivation or enjoyment of activities that you previously enjoyed. Frequent crying spells. Talk with your health care provider if you think that you are experiencing any of these symptoms. General instructions See your health care provider for regular wellness exams and vaccines. This may include: Scheduling regular health, dental, and eye exams. Getting and maintaining your vaccines. These include: Influenza vaccine. Get this vaccine each year before the flu season begins. Pneumonia vaccine. Shingles vaccine. Tetanus, diphtheria, and pertussis (Tdap) booster vaccine. Your health care provider may also recommend other immunizations. Tell your health care provider if  you have ever been abused or do not feel safe at home. Summary Menopause is a normal process in which your ability to get pregnant comes to an end. This condition causes hot flashes, night sweats, decreased interest in sex, mood swings, headaches, or lack of sleep. Treatment for this condition may include hormone replacement therapy. Take actions to keep yourself healthy, including exercising regularly, eating a healthy diet, watching your weight, and checking your blood pressure and blood sugar levels. Get screened for cancer and depression. Make sure that you are up to date with all your vaccines. This information is not intended to replace advice given to you by your health care provider. Make sure you discuss any questions you have with your health care provider. Document Revised: 04/29/2021 Document Reviewed: 04/29/2021 Elsevier Patient Education  2024 [employer].

## 2024-06-09 NOTE — Assessment & Plan Note (Signed)
 Deferred clinical breast exam due to patient preference.  Mammogram is up-to-date.  Deferred pelvic exam patient is no longer screening cervical cancer and no pelvic complaints at this time.  Will schedule DEXA, mammogram.

## 2024-06-10 ENCOUNTER — Ambulatory Visit: Payer: Self-pay | Admitting: Family

## 2024-07-20 DIAGNOSIS — K080 Exfoliation of teeth due to systemic causes: Secondary | ICD-10-CM | POA: Diagnosis not present

## 2024-07-25 DIAGNOSIS — K080 Exfoliation of teeth due to systemic causes: Secondary | ICD-10-CM | POA: Diagnosis not present

## 2024-08-18 DIAGNOSIS — D631 Anemia in chronic kidney disease: Secondary | ICD-10-CM | POA: Diagnosis not present

## 2024-08-18 DIAGNOSIS — N1832 Chronic kidney disease, stage 3b: Secondary | ICD-10-CM | POA: Diagnosis not present

## 2024-08-18 DIAGNOSIS — I10 Essential (primary) hypertension: Secondary | ICD-10-CM | POA: Diagnosis not present

## 2024-08-18 DIAGNOSIS — N1330 Unspecified hydronephrosis: Secondary | ICD-10-CM | POA: Diagnosis not present

## 2024-08-23 NOTE — Progress Notes (Unsigned)
  Electrophysiology Office Follow up Visit Note:    Date:  08/24/2024   ID:  Julie Rush, DOB 1951-02-21, MRN [id number]  PCP:  Julie Rollene MATSU, FNP  [facility] Cardiologist:  Redell Cave, MD  [facility] Electrophysiologist:  OLE ONEIDA HOLTS, MD    Interval History:     Julie Rush is a 73 y.o. female who presents for a follow up visit.   The patient Julie Rush December 28, 2023.  The patient has paroxysmal atrial fibrillation and flutter, hypertension, diabetes, CKD 3A.  She takes Eliquis  for stroke prophylaxis.  She is doing well today.  No problems with her heart rhythm that she is aware of.  Takes Eliquis  twice daily without bleeding issues.  She checks her blood pressures at home and they are in the 120s over 70s.      Past medical, surgical, social and family history were reviewed.  ROS:   Please see the history of present illness.    All other systems reviewed and are negative.  EKGs/Labs/Other Studies Reviewed:    The following studies were reviewed today:     EKG Interpretation Date/Time:  Wednesday August 24 2024 13:30:15 EDT Ventricular Rate:  72 PR Interval:  210 QRS Duration:  116 QT Interval:  378 QTC Calculation: 413 R Axis:   -41  Text Interpretation: Sinus rhythm with sinus arrhythmia with 1st degree A-V block Confirmed by HOLTS OLE [phone]) on 08/24/2024 1:31:52 PM    Physical Exam:    VS:  BP (!) 140/90 (BP Location: Left Arm, Patient Position: Sitting, Cuff Size: Large)   Pulse 72   Ht 5' 6 (1.676 m)   Wt 261 lb (118.4 kg)   SpO2 99%   BMI 42.13 kg/m     Wt Readings from Last 3 Encounters:  08/24/24 261 lb (118.4 kg)  06/09/24 255 lb 3.2 oz (115.8 kg)  02/16/24 253 lb 6.4 oz (114.9 kg)     GEN: no distress CARD: RRR, No MRG RESP: No IWOB. CTAB.      ASSESSMENT:    1. Paroxysmal atrial fibrillation (HCC)   2. Atrial flutter, unspecified type (HCC)   3. Primary hypertension    PLAN:     In order of problems listed above:  #Paroxysmal atrial fibrillation flutter Low burden Continue Eliquis  for stroke prophylaxis  #Hypertension Above goal today.  Recommend checking blood pressures 1-2 times per week at home and recording the values.  Recommend bringing these recordings to the primary care physician. Her blood pressures are controlled at home.  I encouraged her to continue checking them routinely there.  Follow-up 1 year with APP   Signed, OLE HOLTS, MD, [facility], [facility] 08/24/2024 1:35 PM    Electrophysiology [facility]

## 2024-08-24 ENCOUNTER — Encounter: Payer: Self-pay | Admitting: Cardiology

## 2024-08-24 ENCOUNTER — Ambulatory Visit: Attending: Cardiology | Admitting: Cardiology

## 2024-08-24 VITALS — BP 140/90 | HR 72 | Ht 66.0 in | Wt 261.0 lb

## 2024-08-24 DIAGNOSIS — I480 Paroxysmal atrial fibrillation: Secondary | ICD-10-CM

## 2024-08-24 DIAGNOSIS — I4892 Unspecified atrial flutter: Secondary | ICD-10-CM

## 2024-08-24 DIAGNOSIS — I10 Essential (primary) hypertension: Secondary | ICD-10-CM | POA: Diagnosis not present

## 2024-08-24 MED ORDER — APIXABAN 5 MG PO TABS: 5.0000 mg | ORAL_TABLET | Freq: Two times a day (BID) | ORAL | 3 refills | Status: AC

## 2024-08-24 NOTE — Patient Instructions (Signed)

## 2024-08-25 ENCOUNTER — Other Ambulatory Visit: Payer: Self-pay | Admitting: Cardiology

## 2024-08-25 DIAGNOSIS — I4892 Unspecified atrial flutter: Secondary | ICD-10-CM

## 2024-08-29 ENCOUNTER — Ambulatory Visit
Admission: RE | Admit: 2024-08-29 | Discharge: 2024-08-29 | Disposition: A | Discharge status: Home / Self Care | Source: Ambulatory Visit | Attending: Family

## 2024-08-29 DIAGNOSIS — Z0000 Encounter for general adult medical examination without abnormal findings: Secondary | ICD-10-CM | POA: Insufficient documentation

## 2024-08-29 DIAGNOSIS — Z1231 Encounter for screening mammogram for malignant neoplasm of breast: Secondary | ICD-10-CM | POA: Diagnosis not present

## 2024-08-29 DIAGNOSIS — Z780 Asymptomatic menopausal state: Secondary | ICD-10-CM | POA: Diagnosis not present

## 2024-08-30 ENCOUNTER — Ambulatory Visit: Payer: Self-pay | Admitting: Cardiology

## 2024-10-03 ENCOUNTER — Telehealth: Payer: Self-pay | Admitting: *Deleted

## 2024-10-03 ENCOUNTER — Ambulatory Visit: Payer: Medicare Other | Admitting: *Deleted

## 2024-10-03 VITALS — Ht 65.0 in | Wt 253.0 lb

## 2024-10-03 DIAGNOSIS — Z0000 Encounter for general adult medical examination without abnormal findings: Secondary | ICD-10-CM | POA: Diagnosis not present

## 2024-10-03 NOTE — Telephone Encounter (Signed)
 Performed AWV While reviewing cardiac risk patient stated that she does not have diabetes and has asked that it be changed on her chart.  Patient stated that she has pre-diabetes. Please see care gaps related to diabetes and adjust care gap tabs to show these test are not needed.  Thanks

## 2024-10-03 NOTE — Progress Notes (Signed)
 Subjective:   Julie Rush is a 73 y.o. who presents for a Medicare Wellness preventive visit.  As a reminder, Annual Wellness Visits don't include a physical exam, and some assessments may be limited, especially if this visit is performed virtually. We may recommend an in-person follow-up visit with your provider if needed.  Visit Complete: Virtual I connected with  [name] on 10/03/24 by a audio enabled telemedicine application and verified that I am speaking with the correct person using two identifiers.  Patient Location: Home  Provider Location: Home Office  I discussed the limitations of evaluation and management by telemedicine. The patient expressed understanding and agreed to proceed.  Vital Signs: Because this visit was a virtual/telehealth visit, some criteria may be missing or patient reported. Any vitals not documented were not able to be obtained and vitals that have been documented are patient reported.  VideoDeclined- This patient declined Librarian, academic. Therefore the visit was completed with audio only.  Persons Participating in Visit: Patient.  AWV Questionnaire: No: Patient Medicare AWV questionnaire was not completed prior to this visit.  Cardiac Risk Factors include: advanced age (>106men, >63 women);hypertension;obesity (BMI >30kg/m2);sedentary lifestyle;dyslipidemia     Objective:    Today's Vitals   10/03/24 1349  Weight: 253 lb (114.8 kg)  Height: 5' 5 (1.651 m)   Body mass index is 42.1 kg/m.     10/03/2024    2:03 PM 09/29/2023   11:15 AM 07/08/2023    6:30 AM 06/29/2023    2:52 PM 03/23/2023   10:12 PM 09/25/2022    9:19 AM 10/30/2020    9:24 AM  Advanced Directives  Does Patient Have a Medical Advance Directive? Yes Yes Yes Yes Yes Yes Yes  Type of Estate agent of [place];Living will Healthcare Power of [place];Living will Healthcare Power of [place];Living will Healthcare  Power of [place];Living will Healthcare Power of [place];Living will Healthcare Power of [place];Living will Living will;Healthcare Power of Attorney  Does patient want to make changes to medical advance directive?   No - Patient declined  No - Patient declined No - Patient declined   Copy of Healthcare Power of Attorney in Chart? No - copy requested No - copy requested No - copy requested No - copy requested No - copy requested No - copy requested No - copy requested    Current Medications (verified) Outpatient Encounter Medications as of 10/03/2024  Medication Sig   acetaminophen  (TYLENOL ) 500 MG tablet Take 1,000 mg by mouth every 6 (six) hours as needed for moderate pain or headache.    albuterol  (VENTOLIN  HFA) 108 (90 Base) MCG/ACT inhaler INHALE 2 PUFFS BY MOUTH EVERY 6 HOURS AS NEEDED FOR WHEEZE OR SHORTNESS OF BREATH   apixaban  (ELIQUIS ) 5 MG TABS tablet Take 1 tablet (5 mg total) by mouth 2 (two) times daily.   buPROPion  (WELLBUTRIN  XL) 300 MG 24 hr tablet TAKE 1 TABLET BY MOUTH EVERY MORNING   Coenzyme Q10-Fish Oil-Vit E (CO-Q 10 OMEGA-3 FISH OIL PO) Take 1 capsule by mouth daily.   metoprolol  succinate (TOPROL -XL) 25 MG 24 hr tablet Take 0.5 tablets (12.5 mg total) by mouth daily.   Multiple Vitamins-Minerals (PRESERVISION AREDS 2 PO) Take 1 capsule by mouth 2 (two) times daily.   omeprazole  (PRILOSEC ) 20 MG capsule Take 20 mg by mouth daily as needed (heartburn).    No facility-administered encounter medications on file as of 10/03/2024.    Allergies (verified) Codeine, Prednisone, Sulfa antibiotics, Sulfur  dioxide, and Trimethoprim   History: Past Medical History:  Diagnosis Date   Anginal pain    Aortic atherosclerosis    Aortic stenosis    a.) TTE 03/24/2023: mild AS (MPG 8; AVA 2.23)   Arthritis    Atrial fibrillation and flutter (HCC)    a.) CHA2DS2VASc = 5 (age, sex, HTN, vascular disease history, T2DM);  b.) rate/rhythm maintained on oral metoprolol  succinate;  chronically anticoagulated with apixaban    Bilateral carotid artery disease 04/23/2023   a.) carotid doppler 04/23/2023: < 50% BICA   Chronic kidney disease, stage 3 (HCC)    Chronic lower back pain    Coronary artery calcification seen on CT scan    DDD (degenerative disc disease), thoracic    Depression 06/08/2012   related to not getting definitive dx of what's wrong   Diabetes mellitus type 2, diet-controlled (HCC)    Diverticulosis    Exertional dyspnea    GERD (gastroesophageal reflux disease)    H/O hiatal hernia    History of stomach ulcers 1980's   Hyperlipidemia    Hypertension    Iron deficiency anemia    Long term current use of anticoagulant    a.) apixaban    Migraines    4-5x/yr   Morbid obesity with BMI of 40.0-44.9, adult (HCC)    Multiple renal cysts    Pneumonia    PONV (postoperative nausea and vomiting)    Seasonal asthma    Secondary hyperparathyroidism    Sleep apnea 2013   dx'd borderline   Vertigo    Vulvar lesion (RIGHT)    Past Surgical History:  Procedure Laterality Date   APPENDECTOMY  1960's   BREAST BIOPSY Right    right   CATARACT EXTRACTION W/PHACO Left 11/01/2018   Procedure: CATARACT EXTRACTION PHACO AND INTRAOCULAR LENS PLACEMENT (IOC) LEFT;  Surgeon: Myrna Adine Anes, MD;  Location: [facility];  Service: Ophthalmology;  Laterality: Left;   CATARACT EXTRACTION W/PHACO Right 11/29/2018   Procedure: CATARACT EXTRACTION PHACO AND INTRAOCULAR LENS PLACEMENT (IOC) RIGHT;  Surgeon: Myrna Adine Anes, MD;  Location: [facility];  Service: Ophthalmology;  Laterality: Right;  Diabetes - diet conrolled sleep apnea   CHOLECYSTECTOMY  2006   COLONOSCOPY WITH PROPOFOL  N/A 10/30/2020   Procedure: COLONOSCOPY WITH PROPOFOL ;  Surgeon: Maryruth Ole DASEN, MD;  Location: [facility];  Service: Endoscopy;  Laterality: N/A;   EXPLORATORY LAPAROTOMY  late 1990's   blocked colon S/P hernia repair   HERNIA REPAIR     @ least  3 abdominal hernia repairs   LUMBAR DISC SURGERY  1981   ruptured disc   SHOULDER ARTHROSCOPY WITH ROTATOR CUFF REPAIR AND SUBACROMIAL DECOMPRESSION Right 05/25/2019   Procedure: Right shoulder mini open rotator cuff repair, subacromial decompression;  Surgeon: Duwayne Purchase, MD;  Location: [facility];  Service: Orthopedics;  Laterality: Right;  90 mins   VAGINAL HYSTERECTOMY  1981   VULVECTOMY PARTIAL N/A 07/08/2023   Procedure: VULVAR LESION, BIOPSY/REMOVAL;  Surgeon: Leonce Garnette BIRCH, MD;  Location: [facility];  Service: Gynecology;  Laterality: N/A;   Family History  Problem Relation Age of Onset   Arthritis Mother    Cancer Father        prostate   Hyperlipidemia Father    Alcohol abuse Father    Arthritis Father    Heart disease Father 31       paased away at 57 from MI   Stroke Father    Hypertension Father    Diabetes Father  Cancer Sister        ovary   Arthritis Sister    Parkinson's disease Sister    Cancer Sister        breast   Arthritis Sister    Breast cancer Sister 33   Heart disease Brother    Cancer Brother        thyroid    Arthritis Brother    Arthritis Maternal Grandmother    Arthritis Maternal Grandfather    Social History   Socioeconomic History   Marital status: Married    Spouse name: Tommy   Number of children: 2   Years of education: Not on file   Highest education level: GED or equivalent  Occupational History   Not on file  Tobacco Use   Smoking status: Former    Current packs/day: 0.00    Average packs/day: 2.0 packs/day for 14.0 years (28.0 ttl pk-yrs)    Types: Cigarettes    Start date: 11/07/1961    Quit date: 11/08/1975    Years since quitting: 48.9    Passive exposure: Past   Smokeless tobacco: Never  Vaping Use   Vaping status: Never Used  Substance and Sexual Activity   Alcohol use: No    Alcohol/week: 0.0 standard drinks of alcohol   Drug use: No   Sexual activity: Yes    Partners: Male    Comment: Husband  Other  Topics Concern   Not on file  Social History Narrative   Retired.   Lives with husband    Grown children- 2 daughters    2 grandsons    Pets- goats and dogs    Right handed    Caffeine- 1 soda a day, decaff coffee and tea other times    Enjoys reading    Social Drivers of Corporate investment banker Strain: Low Risk  (10/03/2024)   Overall Financial Resource Strain (CARDIA)    Difficulty of Paying Living Expenses: Not hard at all  Food Insecurity: No Food Insecurity (10/03/2024)   Hunger Vital Sign    Worried About Running Out of Food in the Last Year: Never true    Ran Out of Food in the Last Year: Never true  Transportation Needs: No Transportation Needs (10/03/2024)   PRAPARE - Administrator, Civil Service (Medical): No    Lack of Transportation (Non-Medical): No  Physical Activity: Inactive (10/03/2024)   Exercise Vital Sign    Days of Exercise per Week: 0 days    Minutes of Exercise per Session: 0 min  Stress: Stress Concern Present (10/03/2024)   [employer] of Occupational Health - Occupational Stress Questionnaire    Feeling of Stress: To some extent  Social Connections: Moderately Integrated (10/03/2024)   Social Connection and Isolation Panel    Frequency of Communication with Friends and Family: Three times a week    Frequency of Social Gatherings with Friends and Family: More than three times a week    Attends Religious Services: More than 4 times per year    Active Member of Clubs or Organizations: No    Attends Engineer, structural: Never    Marital Status: Married    Tobacco Counseling Counseling given: Not Answered    Clinical Intake:  Pre-visit preparation completed: Yes  Pain : No/denies pain     BMI - recorded: 42.1 Nutritional Status: BMI > 30  Obese Nutritional Risks: None Diabetes: Yes CBG done?: No  Lab Results  Component Value Date   HGBA1C  5.8 06/09/2024   HGBA1C 6.1 03/03/2022   HGBA1C 6.0 09/10/2020      How often do you need to have someone help you when you read instructions, pamphlets, or other written materials from your doctor or pharmacy?: 1 - Never  Interpreter Needed?: No  Information entered by :: R. Leronda Lewers LPN   Activities of Daily Living     10/03/2024    1:53 PM  In your present state of health, do you have any difficulty performing the following activities:  Hearing? 1  Vision? 0  Difficulty concentrating or making decisions? 0  Walking or climbing stairs? 1  Dressing or bathing? 0  Doing errands, shopping? 0  Preparing Food and eating ? N  Using the Toilet? N  In the past six months, have you accidently leaked urine? Y  Do you have problems with loss of bowel control? N  Managing your Medications? N  Managing your Finances? N  Housekeeping or managing your Housekeeping? Y    Patient Care Team: Dineen Rollene MATSU, FNP as PCP - General (Family Medicine) Darliss Rogue, MD as PCP - Cardiology (Cardiology) Cindie Ole DASEN, MD as PCP - Electrophysiology (Cardiology) Marea Selinda RAMAN, MD as Referring Physician (Vascular Surgery) Francisca Rogue BROCKS, MD as Consulting Physician (Urology)  I have updated your Care Teams any recent Medical Services you may have received from other providers in the past year.     Assessment:   This is a routine wellness examination for Mona.  Hearing/Vision screen Hearing Screening - Comments:: Some issues Vision Screening - Comments:: glasses   Goals Addressed             This Visit's Progress    Patient Stated       Wants to work on more mobility        Depression Screen     10/03/2024    1:57 PM 06/09/2024   10:24 AM 09/29/2023   11:10 AM 04/03/2023   10:38 AM 03/23/2023   10:01 AM 09/25/2022    9:22 AM 04/30/2022    9:02 AM  PHQ 2/9 Scores  PHQ - 2 Score 0 0 0 0 0 0 0  PHQ- 9 Score 3  4 4 3   0    Fall Risk     10/03/2024    1:55 PM 06/09/2024   10:24 AM 09/29/2023   11:07 AM 04/03/2023   10:38 AM 03/23/2023    10:03 AM  Fall Risk   Falls in the past year? 0 0 0 0 0  Number falls in past yr: 0 0 0 0 0  Injury with Fall? 0 0 0 0 0  Risk for fall due to : No Fall Risks No Fall Risks No Fall Risks No Fall Risks No Fall Risks  Follow up Falls evaluation completed;Falls prevention discussed Falls evaluation completed Falls prevention discussed;Falls evaluation completed Falls evaluation completed Falls evaluation completed    MEDICARE RISK AT HOME:  Medicare Risk at Home Any stairs in or around the home?: No If so, are there any without handrails?: No Home free of loose throw rugs in walkways, pet beds, electrical cords, etc?: Yes Adequate lighting in your home to reduce risk of falls?: Yes Life alert?: No Use of a cane, walker or w/c?: Yes Grab bars in the bathroom?: Yes Shower chair or bench in shower?: Yes Elevated toilet seat or a handicapped toilet?: Yes  TIMED UP AND GO:  Was the test performed?  No  Cognitive Function: 6CIT  completed        10/03/2024    2:04 PM 09/29/2023   11:16 AM 09/25/2022    9:26 AM 08/09/2019    1:46 PM  6CIT Screen  What Year? 0 points 0 points 0 points 0 points  What month? 0 points 0 points 0 points 0 points  What time? 0 points 0 points 0 points 0 points  Count back from 20 0 points 0 points 0 points 0 points  Months in reverse 0 points 0 points 0 points 0 points  Repeat phrase 0 points 0 points 0 points 0 points  Total Score 0 points 0 points 0 points 0 points    Immunizations Immunization History  Administered Date(s) Administered   Janssen (J&J) SARS-COV-2 Vaccination 09/05/2020   Pneumococcal Conjugate-13 08/31/2014, 11/02/2017   Pneumococcal Polysaccharide-23 01/21/2019   Pneumococcal-Unspecified 09/21/2014   Tdap 08/31/2014, 09/21/2014    Screening Tests Health Maintenance  Topic Date Due   Zoster Vaccines- Shingrix (1 of 2) Never done   FOOT EXAM  01/22/2020   COVID-19 Vaccine (2 - 2025-26 season) 08/22/2024   DTaP/Tdap/Td (3 -  Td or Tdap) 09/21/2024   OPHTHALMOLOGY EXAM  09/16/2024   Diabetic kidney evaluation - Urine ACR  03/25/2025 (Originally 08/02/1969)   HEMOGLOBIN A1C  12/09/2024   Diabetic kidney evaluation - eGFR measurement  06/09/2025   Medicare Annual Wellness (AWV)  10/03/2025   Colonoscopy  10/30/2025   Pneumococcal Vaccine: 50+ Years  Completed   DEXA SCAN  Completed   Hepatitis C Screening  Completed   Meningococcal B Vaccine  Aged Out   Influenza Vaccine  Discontinued   Mammogram  Discontinued    Health Maintenance Items Addressed: Discussed the need to update vaccines which patient declines vaccines but will consider the tetanus vaccine.   Additional Screening:  Vision Screening: Recommended annual ophthalmology exams for early detection of glaucoma and other disorders of the eye. Is the patient up to date with their annual eye exam?  Yes  Who is the provider or what is the name of the office in which the patient attends annual eye exams?   Eye  Dental Screening: Recommended annual dental exams for proper oral hygiene  Community Resource Referral / Chronic Care Management: CRR required this visit?  No   CCM required this visit?  No   Plan:    I have personally reviewed and noted the following in the patient's chart:   Medical and social history Use of alcohol, tobacco or illicit drugs  Current medications and supplements including opioid prescriptions. Patient is not currently taking opioid prescriptions. Functional ability and status Nutritional status Physical activity Advanced directives List of other physicians Hospitalizations, surgeries, and ER visits in previous 12 months Vitals Screenings to include cognitive, depression, and falls Referrals and appointments  In addition, I have reviewed and discussed with patient certain preventive protocols, quality metrics, and best practice recommendations. A written personalized care plan for preventive services as  well as general preventive health recommendations were provided to patient.   Angeline Fredericks, LPN   [id number]   After Visit Summary: (MyChart) Due to this being a telephonic visit, the after visit summary with patients personalized plan was offered to patient via MyChart   Notes: Nothing significant to report at this time.

## 2024-10-03 NOTE — Patient Instructions (Signed)
 Julie Rush,  Thank you for taking the time for your Medicare Wellness Visit. I appreciate your continued commitment to your health goals. Please review the care plan we discussed, and feel free to reach out if I can assist you further.  Medicare recommends these wellness visits once per year to help you and your care team stay ahead of potential health issues. These visits are designed to focus on prevention, allowing your provider to concentrate on managing your acute and chronic conditions during your regular appointments.  Please note that Annual Wellness Visits do not include a physical exam. Some assessments may be limited, especially if the visit was conducted virtually. If needed, we may recommend a separate in-person follow-up with your provider.  Ongoing Care Seeing your primary care provider every 3 to 6 months helps us  monitor your health and provide consistent, personalized care.  Consider updating your vaccines.  Referrals If a referral was made during today's visit and you haven't received any updates within two weeks, please contact the referred provider directly to check on the status.  Recommended Screenings:  Health Maintenance  Topic Date Due   Zoster (Shingles) Vaccine (1 of 2) Never done   Complete foot exam   01/22/2020   COVID-19 Vaccine (2 - 2025-26 season) 08/22/2024   DTaP/Tdap/Td vaccine (3 - Td or Tdap) 09/21/2024   Eye exam for diabetics  09/16/2024   Yearly kidney health urinalysis for diabetes  03/25/2025*   Hemoglobin A1C  12/09/2024   Yearly kidney function blood test for diabetes  06/09/2025   Medicare Annual Wellness Visit  10/03/2025   Colon Cancer Screening  10/30/2025   Pneumococcal Vaccine for age over 40  Completed   DEXA scan (bone density measurement)  Completed   Hepatitis C Screening  Completed   Meningitis B Vaccine  Aged Out   Flu Shot  Discontinued   Breast Cancer Screening  Discontinued  *Topic was postponed. The date shown is not  the original due date.       10/03/2024    2:03 PM  Advanced Directives  Does Patient Have a Medical Advance Directive? Yes  Type of Estate agent of [place];Living will  Copy of Healthcare Power of Attorney in Chart? No - copy requested   Advance Care Planning is important because it: Ensures you receive medical care that aligns with your values, goals, and preferences. Provides guidance to your family and loved ones, reducing the emotional burden of decision-making during critical moments.  Vision: Annual vision screenings are recommended for early detection of glaucoma, cataracts, and diabetic retinopathy. These exams can also reveal signs of chronic conditions such as diabetes and high blood pressure.  Dental: Annual dental screenings help detect early signs of oral cancer, gum disease, and other conditions linked to overall health, including heart disease and diabetes.  Please see the attached documents for additional preventive care recommendations.

## 2024-10-05 ENCOUNTER — Ambulatory Visit: Admitting: Cardiology

## 2024-10-06 ENCOUNTER — Ambulatory Visit
Admission: RE | Admit: 2024-10-06 | Discharge: 2024-10-06 | Disposition: A | Discharge status: Home / Self Care | Source: Ambulatory Visit | Attending: Family | Admitting: Family

## 2024-10-06 DIAGNOSIS — Z0000 Encounter for general adult medical examination without abnormal findings: Secondary | ICD-10-CM

## 2024-10-17 ENCOUNTER — Ambulatory Visit
Admission: RE | Admit: 2024-10-17 | Discharge: 2024-10-17 | Disposition: A | Discharge status: Home / Self Care | Source: Ambulatory Visit | Attending: Urology | Admitting: Urology

## 2024-10-17 DIAGNOSIS — N1330 Unspecified hydronephrosis: Secondary | ICD-10-CM | POA: Diagnosis not present

## 2024-10-17 DIAGNOSIS — N281 Cyst of kidney, acquired: Secondary | ICD-10-CM | POA: Diagnosis not present

## 2024-10-17 DIAGNOSIS — Q6211 Congenital occlusion of ureteropelvic junction: Secondary | ICD-10-CM | POA: Diagnosis not present

## 2024-10-19 ENCOUNTER — Ambulatory Visit: Payer: Self-pay | Admitting: Urology

## 2024-10-26 ENCOUNTER — Other Ambulatory Visit: Payer: Self-pay | Admitting: Family

## 2024-10-26 DIAGNOSIS — F411 Generalized anxiety disorder: Secondary | ICD-10-CM

## 2024-11-01 NOTE — Telephone Encounter (Signed)
 Addressed under health maintenance

## 2024-11-08 ENCOUNTER — Encounter: Payer: Self-pay | Admitting: Urology

## 2024-11-08 ENCOUNTER — Ambulatory Visit: Payer: Self-pay | Admitting: Urology

## 2024-11-08 VITALS — BP 128/63 | HR 82

## 2024-11-08 DIAGNOSIS — Q6211 Congenital occlusion of ureteropelvic junction: Secondary | ICD-10-CM

## 2024-11-08 DIAGNOSIS — N393 Stress incontinence (female) (male): Secondary | ICD-10-CM

## 2024-11-08 NOTE — Patient Instructions (Signed)
Kegel Exercises  Kegel exercises can help strengthen your pelvic floor muscles. The pelvic floor is a group of muscles that support your rectum, small intestine, and bladder. In females, pelvic floor muscles also help support the uterus. These muscles help you control the flow of urine and stool (feces). Kegel exercises are painless and simple. They do not require any equipment. Your provider may suggest Kegel exercises to: Improve bladder and bowel control. Improve sexual response. Improve weak pelvic floor muscles after surgery to remove the uterus (hysterectomy) or after pregnancy, in females. Improve weak pelvic floor muscles after prostate gland removal or surgery, in males. Kegel exercises involve squeezing your pelvic floor muscles. These are the same muscles you squeeze when you try to stop the flow of urine or keep from passing gas. The exercises can be done while sitting, standing, or lying down, but it is best to vary your position. Ask your health care provider which exercises are safe for you. Do exercises exactly as told by your health care provider and adjust them as directed. Do not begin these exercises until told by your health care provider. Exercises How to do Kegel exercises: Squeeze your pelvic floor muscles tight. You should feel a tight lift in your rectal area. If you are a female, you should also feel a tightness in your vaginal area. Keep your stomach, buttocks, and legs relaxed. Hold the muscles tight for up to 10 seconds. Breathe normally. Relax your muscles for up to 10 seconds. Repeat as told by your health care provider. Repeat this exercise daily as told by your health care provider. Continue to do this exercise for at least 4-6 weeks, or for as long as told by your health care provider. You may be referred to a physical therapist who can help you learn more about how to do Kegel exercises. Depending on your condition, your health care provider may  recommend: Varying how long you squeeze your muscles. Doing several sets of exercises every day. Doing exercises for several weeks. Making Kegel exercises a part of your regular exercise routine. This information is not intended to replace advice given to you by your health care provider. Make sure you discuss any questions you have with your health care provider. Document Revised: 04/18/2021 Document Reviewed: 04/18/2021 Elsevier Patient Education  2024 Elsevier Inc.  

## 2024-11-08 NOTE — Progress Notes (Signed)
   11/08/2024 10:24 AM   Julie Rush [birth year] [id number]  Reason for visit: Follow up left hydronephrosis, CKD, stress incontinence  History: Initial visit June 2023 for possible mild left hydronephrosis on ultrasound and CKD with eGFR 41 Follow-up CT showed very subtle left hydronephrosis either extrarenal pelvis versus questionable UPJ obstruction Completely asymptomatic, no UTIs, flank pain, or gross hematuria Mild stress incontinence chronic since the 90s  Physical Exam: BP 128/63 (BP Location: Left Arm, Patient Position: Sitting, Cuff Size: Large)   Pulse 82   SpO2 98%   Imaging/labs: I personally viewed and interpreted the most recent renal ultrasound from October 2025 showing no hydronephrosis or abnormalities  Today: Denies any urologic complaints since her last visit, stable mild stress incontinence.  Denies any flank pain  Plan:    Left hydronephrosis: Very subtle on prior imaging and resolved on most recent ultrasound, likely more consistent with extrarenal pelvis than true obstruction.  No follow-up needed Stress incontinence: Reviewed Kegel exercises Follow-up with urology PRN   [name], MD  [facility] [address] [place], [place] [address] [phone]

## 2024-11-09 LAB — OPHTHALMOLOGY REPORT-SCANNED

## 2024-11-10 DIAGNOSIS — Z961 Presence of intraocular lens: Secondary | ICD-10-CM | POA: Diagnosis not present

## 2024-11-10 DIAGNOSIS — E119 Type 2 diabetes mellitus without complications: Secondary | ICD-10-CM | POA: Diagnosis not present

## 2024-11-10 DIAGNOSIS — H353131 Nonexudative age-related macular degeneration, bilateral, early dry stage: Secondary | ICD-10-CM | POA: Diagnosis not present

## 2024-12-09 ENCOUNTER — Other Ambulatory Visit: Payer: Self-pay | Admitting: Family

## 2025-02-14 ENCOUNTER — Encounter (INDEPENDENT_AMBULATORY_CARE_PROVIDER_SITE_OTHER): Payer: Medicare Other

## 2025-02-14 ENCOUNTER — Ambulatory Visit (INDEPENDENT_AMBULATORY_CARE_PROVIDER_SITE_OTHER): Payer: Medicare Other | Admitting: Vascular Surgery

## 2025-06-14 ENCOUNTER — Encounter: Admitting: Family

## 2025-10-04 ENCOUNTER — Ambulatory Visit
